# Patient Record
Sex: Female | Born: 1965
Health system: Southern US, Community
[De-identification: ages and names within clinical notes are randomized; demographics above are authoritative.]

## PROBLEM LIST (undated history)

## (undated) DIAGNOSIS — E119 Type 2 diabetes mellitus without complications: Secondary | ICD-10-CM

## (undated) DIAGNOSIS — I10 Essential (primary) hypertension: Secondary | ICD-10-CM

## (undated) DIAGNOSIS — E559 Vitamin D deficiency, unspecified: Secondary | ICD-10-CM

## (undated) HISTORY — PX: NEPHRECTOMY: SHX65

---
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
## 1998-06-23 ENCOUNTER — Emergency Department (HOSPITAL_COMMUNITY): Admission: EM | Admit: 1998-06-23 | Discharge: 1998-06-23 | Payer: Self-pay | Admitting: Internal Medicine

## 1999-04-09 ENCOUNTER — Encounter: Admission: RE | Admit: 1999-04-09 | Discharge: 1999-04-09 | Payer: Self-pay | Admitting: Family Medicine

## 1999-04-15 ENCOUNTER — Encounter: Admission: RE | Admit: 1999-04-15 | Discharge: 1999-04-15 | Payer: Self-pay | Admitting: Family Medicine

## 1999-11-11 ENCOUNTER — Encounter: Admission: RE | Admit: 1999-11-11 | Discharge: 1999-11-11 | Payer: Self-pay | Admitting: Family Medicine

## 2000-02-28 ENCOUNTER — Emergency Department (HOSPITAL_COMMUNITY): Admission: EM | Admit: 2000-02-28 | Discharge: 2000-02-28 | Payer: Self-pay | Admitting: Emergency Medicine

## 2001-01-11 ENCOUNTER — Encounter: Admission: RE | Admit: 2001-01-11 | Discharge: 2001-04-11 | Payer: Self-pay | Admitting: Specialist

## 2001-01-12 ENCOUNTER — Encounter: Admission: RE | Admit: 2001-01-12 | Discharge: 2001-01-12 | Payer: Self-pay | Admitting: Family Medicine

## 2001-03-02 ENCOUNTER — Encounter: Admission: RE | Admit: 2001-03-02 | Discharge: 2001-03-02 | Payer: Self-pay | Admitting: Family Medicine

## 2001-04-05 ENCOUNTER — Encounter: Admission: RE | Admit: 2001-04-05 | Discharge: 2001-04-05 | Payer: Self-pay | Admitting: Family Medicine

## 2001-06-30 ENCOUNTER — Emergency Department (HOSPITAL_COMMUNITY): Admission: EM | Admit: 2001-06-30 | Discharge: 2001-06-30 | Payer: Self-pay | Admitting: Emergency Medicine

## 2002-09-25 ENCOUNTER — Emergency Department (HOSPITAL_COMMUNITY): Admission: EM | Admit: 2002-09-25 | Discharge: 2002-09-25 | Payer: Self-pay | Admitting: Emergency Medicine

## 2002-09-25 ENCOUNTER — Encounter: Payer: Self-pay | Admitting: Emergency Medicine

## 2002-11-19 ENCOUNTER — Emergency Department (HOSPITAL_COMMUNITY): Admission: EM | Admit: 2002-11-19 | Discharge: 2002-11-19 | Payer: Self-pay | Admitting: Emergency Medicine

## 2002-11-19 ENCOUNTER — Encounter: Payer: Self-pay | Admitting: Emergency Medicine

## 2003-05-11 ENCOUNTER — Emergency Department (HOSPITAL_COMMUNITY): Admission: EM | Admit: 2003-05-11 | Discharge: 2003-05-11 | Payer: Self-pay | Admitting: Emergency Medicine

## 2004-08-06 ENCOUNTER — Ambulatory Visit: Payer: Self-pay | Admitting: Family Medicine

## 2004-08-18 ENCOUNTER — Encounter: Admission: RE | Admit: 2004-08-18 | Discharge: 2004-08-18 | Payer: Self-pay | Admitting: Sports Medicine

## 2004-08-26 ENCOUNTER — Ambulatory Visit: Payer: Self-pay | Admitting: Family Medicine

## 2004-09-09 ENCOUNTER — Ambulatory Visit (HOSPITAL_COMMUNITY): Admission: RE | Admit: 2004-09-09 | Discharge: 2004-09-09 | Payer: Self-pay | Admitting: Sports Medicine

## 2005-03-25 ENCOUNTER — Ambulatory Visit: Payer: Self-pay | Admitting: Family Medicine

## 2005-04-23 ENCOUNTER — Ambulatory Visit: Payer: Self-pay | Admitting: Family Medicine

## 2005-09-02 ENCOUNTER — Ambulatory Visit: Payer: Self-pay | Admitting: Family Medicine

## 2005-09-02 ENCOUNTER — Ambulatory Visit (HOSPITAL_COMMUNITY): Admission: RE | Admit: 2005-09-02 | Discharge: 2005-09-02 | Payer: Self-pay | Admitting: Family Medicine

## 2005-09-03 ENCOUNTER — Ambulatory Visit: Payer: Self-pay | Admitting: Family Medicine

## 2005-09-16 ENCOUNTER — Encounter: Admission: RE | Admit: 2005-09-16 | Discharge: 2005-09-16 | Payer: Self-pay | Admitting: Family Medicine

## 2007-01-09 ENCOUNTER — Ambulatory Visit: Payer: Self-pay | Admitting: Family Medicine

## 2007-01-19 DIAGNOSIS — D509 Iron deficiency anemia, unspecified: Secondary | ICD-10-CM

## 2013-11-22 DIAGNOSIS — Z780 Asymptomatic menopausal state: Secondary | ICD-10-CM

## 2013-11-22 HISTORY — DX: Asymptomatic menopausal state: Z78.0

## 2017-03-04 ENCOUNTER — Encounter (HOSPITAL_BASED_OUTPATIENT_CLINIC_OR_DEPARTMENT_OTHER): Payer: Self-pay | Admitting: *Deleted

## 2017-03-04 ENCOUNTER — Emergency Department (HOSPITAL_BASED_OUTPATIENT_CLINIC_OR_DEPARTMENT_OTHER)
Admission: EM | Admit: 2017-03-04 | Discharge: 2017-03-04 | Disposition: A | Discharge status: Home / Self Care | Payer: Self-pay | Attending: Emergency Medicine | Admitting: Emergency Medicine

## 2017-03-04 ENCOUNTER — Ambulatory Visit (INDEPENDENT_AMBULATORY_CARE_PROVIDER_SITE_OTHER): Payer: Self-pay | Admitting: Family Medicine

## 2017-03-04 ENCOUNTER — Encounter: Payer: Self-pay | Admitting: Family Medicine

## 2017-03-04 ENCOUNTER — Other Ambulatory Visit (HOSPITAL_COMMUNITY)
Admission: RE | Admit: 2017-03-04 | Discharge: 2017-03-04 | Disposition: A | Discharge status: Home / Self Care | Payer: Self-pay | Source: Ambulatory Visit | Attending: Family Medicine | Admitting: Family Medicine

## 2017-03-04 VITALS — BP 172/90 | HR 78 | Temp 98.6°F | Resp 16 | Ht 68.0 in | Wt 200.0 lb

## 2017-03-04 DIAGNOSIS — E119 Type 2 diabetes mellitus without complications: Secondary | ICD-10-CM | POA: Insufficient documentation

## 2017-03-04 DIAGNOSIS — Z131 Encounter for screening for diabetes mellitus: Secondary | ICD-10-CM

## 2017-03-04 DIAGNOSIS — Z794 Long term (current) use of insulin: Secondary | ICD-10-CM

## 2017-03-04 DIAGNOSIS — N760 Acute vaginitis: Secondary | ICD-10-CM

## 2017-03-04 DIAGNOSIS — E785 Hyperlipidemia, unspecified: Secondary | ICD-10-CM

## 2017-03-04 DIAGNOSIS — Z79899 Other long term (current) drug therapy: Secondary | ICD-10-CM | POA: Insufficient documentation

## 2017-03-04 DIAGNOSIS — E1165 Type 2 diabetes mellitus with hyperglycemia: Secondary | ICD-10-CM | POA: Insufficient documentation

## 2017-03-04 DIAGNOSIS — I10 Essential (primary) hypertension: Secondary | ICD-10-CM

## 2017-03-04 DIAGNOSIS — R739 Hyperglycemia, unspecified: Secondary | ICD-10-CM

## 2017-03-04 HISTORY — DX: Essential (primary) hypertension: I10

## 2017-03-04 HISTORY — DX: Type 2 diabetes mellitus without complications: E11.9

## 2017-03-04 LAB — POCT URINALYSIS DIP (DEVICE)
Bilirubin Urine: NEGATIVE
Glucose, UA: 500 mg/dL — AB
Ketones, ur: NEGATIVE mg/dL
Nitrite: NEGATIVE
Protein, ur: NEGATIVE mg/dL
Specific Gravity, Urine: 1.01 (ref 1.005–1.030)
Urobilinogen, UA: 0.2 mg/dL (ref 0.0–1.0)
pH: 7 (ref 5.0–8.0)

## 2017-03-04 LAB — LIPID PANEL
Cholesterol: 213 mg/dL — ABNORMAL HIGH (ref <200)
HDL: 46 mg/dL — ABNORMAL LOW (ref >50)
LDL CALC: 118 mg/dL — AB (ref <100)
TRIGLYCERIDES: 245 mg/dL — AB (ref <150)
Total CHOL/HDL Ratio: 4.6 Ratio (ref <5.0)
VLDL: 49 mg/dL — AB (ref <30)

## 2017-03-04 LAB — CBC WITH DIFFERENTIAL/PLATELET
BASOS ABS: 49 {cells}/uL (ref 0–200)
Basophils Relative: 1 %
EOS ABS: 147 {cells}/uL (ref 15–500)
Eosinophils Relative: 3 %
HCT: 42.6 % (ref 35.0–45.0)
Hemoglobin: 14.1 g/dL (ref 11.7–15.5)
LYMPHS PCT: 39 %
Lymphs Abs: 1911 cells/uL (ref 850–3900)
MCH: 30.2 pg (ref 27.0–33.0)
MCHC: 33.1 g/dL (ref 32.0–36.0)
MCV: 91.2 fL (ref 80.0–100.0)
MONOS PCT: 5 %
Monocytes Absolute: 245 cells/uL (ref 200–950)
NEUTROS PCT: 52 %
Neutro Abs: 2548 cells/uL (ref 1500–7800)
PLATELETS: 171 10*3/uL (ref 140–400)
RBC: 4.67 MIL/uL (ref 3.80–5.10)
RDW: 12.7 % (ref 11.0–15.0)
WBC: 4.9 10*3/uL (ref 3.8–10.8)

## 2017-03-04 LAB — CBG MONITORING, ED: Glucose-Capillary: 317 mg/dL — ABNORMAL HIGH (ref 65–99)

## 2017-03-04 LAB — GLUCOSE, CAPILLARY
Glucose-Capillary: 520 mg/dL (ref 65–99)
Glucose-Capillary: 467 mg/dL — ABNORMAL HIGH (ref 65–99)

## 2017-03-04 LAB — THYROID PANEL WITH TSH
Free Thyroxine Index: 2.4 (ref 1.4–3.8)
T3 Uptake: 32 % (ref 22–35)
T4, Total: 7.4 ug/dL (ref 4.5–12.0)
TSH: 2.36 mIU/L

## 2017-03-04 LAB — COMPLETE METABOLIC PANEL WITH GFR
ALT: 70 U/L — AB (ref 6–29)
AST: 53 U/L — AB (ref 10–35)
Albumin: 4.3 g/dL (ref 3.6–5.1)
Alkaline Phosphatase: 143 U/L — ABNORMAL HIGH (ref 33–130)
BUN: 9 mg/dL (ref 7–25)
CO2: 30 mmol/L (ref 20–31)
CREATININE: 1.03 mg/dL (ref 0.50–1.05)
Calcium: 9.4 mg/dL (ref 8.6–10.4)
Chloride: 97 mmol/L — ABNORMAL LOW (ref 98–110)
GFR, EST NON AFRICAN AMERICAN: 63 mL/min (ref >=60)
GFR, Est African American: 72 mL/min (ref >=60)
GLUCOSE: 527 mg/dL — AB (ref 65–99)
Potassium: 4.5 mmol/L (ref 3.5–5.3)
SODIUM: 135 mmol/L (ref 135–146)
Total Bilirubin: 0.8 mg/dL (ref 0.2–1.2)
Total Protein: 7.9 g/dL (ref 6.1–8.1)

## 2017-03-04 LAB — POCT GLYCOSYLATED HEMOGLOBIN (HGB A1C): Hemoglobin A1C: 13.6

## 2017-03-04 MED ORDER — INJECTION DEVICE FOR INSULIN DEVI
Freq: Once | Status: AC
  Administered 2017-03-04: 16:00:00

## 2017-03-04 MED ORDER — AMLODIPINE BESYLATE 5 MG PO TABS: 5.0000 mg | ORAL_TABLET | Freq: Every day | ORAL | 0 refills | Status: DC

## 2017-03-04 MED ORDER — METFORMIN HCL 500 MG PO TABS: 500.0000 mg | ORAL_TABLET | Freq: Two times a day (BID) | ORAL | 0 refills | Status: DC

## 2017-03-04 MED ORDER — AMLODIPINE BESYLATE 5 MG PO TABS: 5.0000 mg | ORAL_TABLET | Freq: Every day | ORAL | 3 refills | Status: DC

## 2017-03-04 MED ORDER — METFORMIN HCL 500 MG PO TABS: 500.0000 mg | ORAL_TABLET | Freq: Two times a day (BID) | ORAL | 3 refills | Status: DC

## 2017-03-04 MED ORDER — FLUCONAZOLE 150 MG PO TABS: 150.0000 mg | ORAL_TABLET | Freq: Once | ORAL | 0 refills | Status: AC

## 2017-03-04 MED ORDER — INSULIN LISPRO 100 UNIT/ML ~~LOC~~ SOLN: 5.0000 [IU] | Freq: Three times a day (TID) | SUBCUTANEOUS | 11 refills | Status: DC

## 2017-03-04 MED ORDER — INJECTION DEVICE FOR INSULIN DEVI: Freq: Once | Status: DC

## 2017-03-04 MED ORDER — INSULIN GLARGINE 100 UNIT/ML SOLOSTAR PEN: 10.0000 [IU] | PEN_INJECTOR | Freq: Every day | SUBCUTANEOUS | 99 refills | Status: DC

## 2017-03-04 MED ORDER — BLOOD GLUCOSE MONITOR KIT: PACK | 0 refills | Status: DC

## 2017-03-04 MED FILL — !TRUE METRIX BLOOD GLUCOSE: 365 days supply | Qty: 1 | Fill #0

## 2017-03-04 MED FILL — TRUEplus LANCETS 28G MISC: 25 days supply | Qty: 100 | Fill #0

## 2017-03-04 MED FILL — ?METFORMIN HCL 500MG TABLET: 500 | 30 days supply | Qty: 60 | Fill #0

## 2017-03-04 MED FILL — TRUE METRIX TEST STRIP: 25 days supply | Qty: 100 | Fill #0

## 2017-03-04 MED FILL — FLUCONAZOLE 150 MG TABLET: 150 | 7 days supply | Qty: 7 | Fill #0

## 2017-03-04 MED FILL — ?AMLODIPINE BESYLATE 5 MG T: 5 | 30 days supply | Qty: 30 | Fill #0

## 2017-03-04 MED FILL — !LANTUS SOLOSTAR 100UNITS/M: 100 | 28 days supply | Qty: 3 | Fill #0

## 2017-03-04 NOTE — ED Triage Notes (Signed)
Pt states Dx DM today given scription, and pharmacy closed before she got them.

## 2017-03-04 NOTE — Patient Instructions (Addendum)
Administer 10 units of Lantus nightly   Start Metformin 500 mg twice daily with meals.  Administer 5 units of Humalog 3 times daily before meals. Do not administer if blood sugar is less than 125!  For blood pressure, start Amlodipine 5 mg once daily.    Diabetes Mellitus and Food It is important for you to manage your blood sugar (glucose) level. Your blood glucose level can be greatly affected by what you eat. Eating healthier foods in the appropriate amounts throughout the day at about the same time each day will help you control your blood glucose level. It can also help slow or prevent worsening of your diabetes mellitus. Healthy eating may even help you improve the level of your blood pressure and reach or maintain a healthy weight. General recommendations for healthful eating and cooking habits include:  Eating meals and snacks regularly. Avoid going long periods of time without eating to lose weight.  Eating a diet that consists mainly of plant-based foods, such as fruits, vegetables, nuts, legumes, and whole grains.  Using low-heat cooking methods, such as baking, instead of high-heat cooking methods, such as deep frying. Work with your dietitian to make sure you understand how to use the Nutrition Facts information on food labels. How can food affect me? Carbohydrates  Carbohydrates affect your blood glucose level more than any other type of food. Your dietitian will help you determine how many carbohydrates to eat at each meal and teach you how to count carbohydrates. Counting carbohydrates is important to keep your blood glucose at a healthy level, especially if you are using insulin or taking certain medicines for diabetes mellitus. Alcohol  Alcohol can cause sudden decreases in blood glucose (hypoglycemia), especially if you use insulin or take certain medicines for diabetes mellitus. Hypoglycemia can be a life-threatening condition. Symptoms of hypoglycemia (sleepiness,  dizziness, and disorientation) are similar to symptoms of having too much alcohol. If your health care provider has given you approval to drink alcohol, do so in moderation and use the following guidelines:  Women should not have more than one drink per day, and men should not have more than two drinks per day. One drink is equal to:  12 oz of beer.  5 oz of wine.  1 oz of hard liquor.  Do not drink on an empty stomach.  Keep yourself hydrated. Have water, diet soda, or unsweetened iced tea.  Regular soda, juice, and other mixers might contain a lot of carbohydrates and should be counted. What foods are not recommended? As you make food choices, it is important to remember that all foods are not the same. Some foods have fewer nutrients per serving than other foods, even though they might have the same number of calories or carbohydrates. It is difficult to get your body what it needs when you eat foods with fewer nutrients. Examples of foods that you should avoid that are high in calories and carbohydrates but low in nutrients include:  Trans fats (most processed foods list trans fats on the Nutrition Facts label).  Regular soda.  Juice.  Candy.  Sweets, such as cake, pie, doughnuts, and cookies.  Fried foods. What foods can I eat? Eat nutrient-rich foods, which will nourish your body and keep you healthy. The food you should eat also will depend on several factors, including:  The calories you need.  The medicines you take.  Your weight.  Your blood glucose level.  Your blood pressure level.  Your cholesterol level. You  should eat a variety of foods, including:  Protein.  Lean cuts of meat.  Proteins low in saturated fats, such as fish, egg whites, and beans. Avoid processed meats.  Fruits and vegetables.  Fruits and vegetables that may help control blood glucose levels, such as apples, mangoes, and yams.  Dairy products.  Choose fat-free or low-fat dairy  products, such as milk, yogurt, and cheese.  Grains, bread, pasta, and rice.  Choose whole grain products, such as multigrain bread, whole oats, and brown rice. These foods may help control blood pressure.  Fats.  Foods containing healthful fats, such as nuts, avocado, olive oil, canola oil, and fish. Does everyone with diabetes mellitus have the same meal plan? Because every person with diabetes mellitus is different, there is not one meal plan that works for everyone. It is very important that you meet with a dietitian who will help you create a meal plan that is just right for you. This information is not intended to replace advice given to you by your health care provider. Make sure you discuss any questions you have with your health care provider. Document Released: 08/05/2005 Document Revised: 04/15/2016 Document Reviewed: 10/05/2013 Elsevier Interactive Patient Education  2017 [employer].  Blood Glucose Monitoring, Adult Monitoring your blood sugar (glucose) helps you manage your diabetes. It also helps you and your health care provider determine how well your diabetes management plan is working. Blood glucose monitoring involves checking your blood glucose as often as directed, and keeping a record (log) of your results over time. Why should I monitor my blood glucose? Checking your blood glucose regularly can:  Help you understand how food, exercise, illnesses, and medicines affect your blood glucose.  Let you know what your blood glucose is at any time. You can quickly tell if you are having low blood glucose (hypoglycemia) or high blood glucose (hyperglycemia).  Help you and your health care provider adjust your medicines as needed. When should I check my blood glucose? Follow instructions from your health care provider about how often to check your blood glucose. This may depend on:  The type of diabetes you have.  How well-controlled your diabetes is.  Medicines you  are taking. If you have type 1 diabetes:   Check your blood glucose at least 2 times a day.  Also check your blood glucose:  Before every insulin injection.  Before and after exercise.  Between meals.  2 hours after a meal.  Occasionally between 2:00 a.m. and 3:00 a.m., as directed.  Before potentially dangerous tasks, like driving or using heavy machinery.  At bedtime.  You may need to check your blood glucose more often, up to 6-10 times a day:  If you use an insulin pump.  If you need multiple daily injections (MDI).  If your diabetes is not well-controlled.  If you are ill.  If you have a history of severe hypoglycemia.  If you have a history of not knowing when your blood glucose is getting low (hypoglycemia unawareness). If you have type 2 diabetes:   If you take insulin or other diabetes medicines, check your blood glucose at least 2 times a day.  If you are on intensive insulin therapy, check your blood glucose at least 4 times a day. Occasionally, you may also need to check between 2:00 a.m. and 3:00 a.m., as directed.  Also check your blood glucose:  Before and after exercise.  Before potentially dangerous tasks, like driving or using heavy machinery.  You  may need to check your blood glucose more often if:  Your medicine is being adjusted.  Your diabetes is not well-controlled.  You are ill. What is a blood glucose log?  A blood glucose log is a record of your blood glucose readings. It helps you and your health care provider:  Look for patterns in your blood glucose over time.  Adjust your diabetes management plan as needed.  Every time you check your blood glucose, write down your result and notes about things that may be affecting your blood glucose, such as your diet and exercise for the day.  Most glucose meters store a record of glucose readings in the meter. Some meters allow you to download your records to a computer. How do I check  my blood glucose? Follow these steps to get accurate readings of your blood glucose: Supplies needed    Blood glucose meter.  Test strips for your meter. Each meter has its own strips. You must use the strips that come with your meter.  A needle to prick your finger (lancet). Do not use lancets more than once.  A device that holds the lancet (lancing device).  A journal or log book to write down your results. Procedure   Wash your hands with soap and water.  Prick the side of your finger (not the tip) with the lancet. Use a different finger each time.  Gently rub the finger until a small drop of blood appears.  Follow instructions that come with your meter for inserting the test strip, applying blood to the strip, and using your blood glucose meter.  Write down your result and any notes. Alternative testing sites   Some meters allow you to use areas of your body other than your finger (alternative sites) to test your blood.  If you think you may have hypoglycemia, or if you have hypoglycemia unawareness, do not use alternative sites. Use your finger instead.  Alternative sites may not be as accurate as the fingers, because blood flow is slower in these areas. This means that the result you get may be delayed, and it may be different from the result that you would get from your finger.  The most common alternative sites are:  Forearm.  Thigh.  Palm of the hand. Additional tips   Always keep your supplies with you.  If you have questions or need help, all blood glucose meters have a 24-hour "hotline" number that you can call. You may also contact your health care provider.  After you use a few boxes of test strips, adjust (calibrate) your blood glucose meter by following instructions that came with your meter. This information is not intended to replace advice given to you by your health care provider. Make sure you discuss any questions you have with your health care  provider. Document Released: 11/11/2003 Document Revised: 05/28/2016 Document Reviewed: 04/19/2016 Elsevier Interactive Patient Education  2017 [place].  Hypoglycemia  Hypoglycemia is when the sugar (glucose) level in the blood is too low. Symptoms of low blood sugar may include:  Feeling:  Hungry.  Worried or nervous (anxious).  Sweaty and clammy.  Confused.  Dizzy.  Sleepy.  Sick to your stomach (nauseous).  Having:  A fast heartbeat.  A headache.  A change in your vision.  Jerky movements that you cannot control (seizure).  Nightmares.  Tingling or no feeling (numbness) around the mouth, lips, or tongue.  Having trouble with:  Talking.  Paying attention (concentrating).  Moving (coordination).  Sleeping.  Shaking.  Passing out (fainting).  Getting upset easily (irritability). Low blood sugar can happen to people who have diabetes and people who do not have diabetes. Low blood sugar can happen quickly, and it can be an emergency. Treating Low Blood Sugar  Low blood sugar is often treated by eating or drinking something sugary right away. If you can think clearly and swallow safely, follow the 15:15 rule:  Take 15 grams of a fast-acting carb (carbohydrate). Some fast-acting carbs are:  1 tube of glucose gel.  3 sugar tablets (glucose pills).  6-8 pieces of hard candy.  4 oz (120 mL) of fruit juice.  4 oz (120 mL) of regular (not diet) soda.  Check your blood sugar 15 minutes after you take the carb.  If your blood sugar is still at or below 70 mg/dL (3.9 mmol/L), take 15 grams of a carb again.  If your blood sugar does not go above 70 mg/dL (3.9 mmol/L) after 3 tries, get help right away.  After your blood sugar goes back to normal, eat a meal or a snack within 1 hour. Treating Very Low Blood Sugar  If your blood sugar is at or below 54 mg/dL (3 mmol/L), you have very low blood sugar (severe hypoglycemia). This is an emergency. Do not  wait to see if the symptoms will go away. Get medical help right away. Call your local emergency services (911 in the [place] Do not drive yourself to the hospital. If you have very low blood sugar and you cannot eat or drink, you may need a glucagon shot (injection). A family member or friend should learn how to check your blood sugar and how to give you a glucagon shot. Ask your doctor if you need to have a glucagon shot kit at home. Follow these instructions at home: General instructions   Avoid any diets that cause you to not eat enough food. Talk with your doctor before you start any new diet.  Take over-the-counter and prescription medicines only as told by your doctor.  Limit alcohol to no more than 1 drink per day for nonpregnant women and 2 drinks per day for men. One drink equals 12 oz of beer, 5 oz of wine, or 1 oz of hard liquor.  Keep all follow-up visits as told by your doctor. This is important. If You Have Diabetes:    Make sure you know the symptoms of low blood sugar.  Always keep a source of sugar with you, such as:  Sugar.  Sugar tablets.  Glucose gel.  Fruit juice.  Regular soda (not diet soda).  Milk.  Hard candy.  Honey.  Take your medicines as told.  Follow your exercise and meal plan.  Eat on time. Do not skip meals.  Follow your sick day plan when you cannot eat or drink normally. Make this plan ahead of time with your doctor.  Check your blood sugar as often as told by your doctor. Always check before and after exercise.  Share your diabetes care plan with:  Your work or school.  People you live with.  Check your pee (urine) for ketones:  When you are sick.  As told by your doctor.  Carry a card or wear jewelry that says you have diabetes. If You Have Low Blood Sugar From Other Causes:    Check your blood sugar as often as told by your doctor.  Follow instructions from your doctor about what you cannot eat or drink. Contact  a  doctor if:  You have trouble keeping your blood sugar in your target range.  You have low blood sugar often. Get help right away if:  You still have symptoms after you eat or drink something sugary.  Your blood sugar is at or below 54 mg/dL (3 mmol/L).  You have jerky movements that you cannot control.  You pass out. These symptoms may be an emergency. Do not wait to see if the symptoms will go away. Get medical help right away. Call your local emergency services (911 in the [place] Do not drive yourself to the hospital. This information is not intended to replace advice given to you by your health care provider. Make sure you discuss any questions you have with your health care provider. Document Released: 02/02/2010 Document Revised: 04/15/2016 Document Reviewed: 12/12/2015 Elsevier Interactive Patient Education  2017 [employer] Inc[employer]    Hypertension Hypertension is another name for high blood pressure. High blood pressure forces your heart to work harder to pump blood. This can cause problems over time. There are two numbers in a blood pressure reading. There is a top number (systolic) over a bottom number (diastolic). It is best to have a blood pressure below 120/80. Healthy choices can help lower your blood pressure. You may need medicine to help lower your blood pressure if:  Your blood pressure cannot be lowered with healthy choices.  Your blood pressure is higher than 130/80. Follow these instructions at home: Eating and drinking   If directed, follow the DASH eating plan. This diet includes:  Filling half of your plate at each meal with fruits and vegetables.  Filling one quarter of your plate at each meal with whole grains. Whole grains include whole wheat pasta, brown rice, and whole grain bread.  Eating or drinking low-fat dairy products, such as skim milk or low-fat yogurt.  Filling one quarter of your plate at each meal with low-fat (lean) proteins. Low-fat proteins  include fish, skinless chicken, eggs, beans, and tofu.  Avoiding fatty meat, cured and processed meat, or chicken with skin.  Avoiding premade or processed food.  Eat less than 1,500 mg of salt (sodium) a day.  Limit alcohol use to no more than 1 drink a day for nonpregnant women and 2 drinks a day for men. One drink equals 12 oz of beer, 5 oz of wine, or 1 oz of hard liquor. Lifestyle   Work with your doctor to stay at a healthy weight or to lose weight. Ask your doctor what the best weight is for you.  Get at least 30 minutes of exercise that causes your heart to beat faster (aerobic exercise) most days of the week. This may include walking, swimming, or biking.  Get at least 30 minutes of exercise that strengthens your muscles (resistance exercise) at least 3 days a week. This may include lifting weights or pilates.  Do not use any products that contain nicotine or tobacco. This includes cigarettes and e-cigarettes. If you need help quitting, ask your doctor.  Check your blood pressure at home as told by your doctor.  Keep all follow-up visits as told by your doctor. This is important. Medicines   Take over-the-counter and prescription medicines only as told by your doctor. Follow directions carefully.  Do not skip doses of blood pressure medicine. The medicine does not work as well if you skip doses. Skipping doses also puts you at risk for problems.  Ask your doctor about side effects or reactions to medicines  that you should watch for. Contact a doctor if:  You think you are having a reaction to the medicine you are taking.  You have headaches that keep coming back (recurring).  You feel dizzy.  You have swelling in your ankles.  You have trouble with your vision. Get help right away if:  You get a very bad headache.  You start to feel confused.  You feel weak or numb.  You feel faint.  You get very bad pain in your:  Chest.  Belly (abdomen).  You throw  up (vomit) more than once.  You have trouble breathing. Summary  Hypertension is another name for high blood pressure.  Making healthy choices can help lower blood pressure. If your blood pressure cannot be controlled with healthy choices, you may need to take medicine. This information is not intended to replace advice given to you by your health care provider. Make sure you discuss any questions you have with your health care provider. Document Released: 04/26/2008 Document Revised: 10/06/2016 Document Reviewed: 10/06/2016 Elsevier Interactive Patient Education  2017 [employer].

## 2017-03-04 NOTE — ED Provider Notes (Signed)
[facility] Note   CSN: [id number] Arrival date & time: 03/04/17  1827  By signing my name below, I, Kimberly Herman, attest that this documentation has been prepared under the direction and in the presence of Kimberly Bucco, MD . Electronically Signed: Nelwyn Herman, Scribe. 03/04/2017. 7:15 PM.  History   Chief Complaint Chief Complaint  Patient presents with  . Hyperglycemia   The history is provided by the patient. No language interpreter was used.    HPI Comments:  Kimberly Herman is a 51 y.o. female with pmhx of DM and HTN who presents to the Emergency Department complaining of resolved headache onset today. PT was diagnosed with DM today when she was [facility] for establishment of care.  She has been unable to get those prescriptions due to the fact that the Pharmacy is closed and her blood sugar was particularly high today. No modifying factors indicated. Denies any other symptoms.   Past Medical History:  Diagnosis Date  . Diabetes mellitus without complication (HCC)   . Hypertension     Patient Active Problem List   Diagnosis Date Noted  . ANEMIA, IRON DEFICIENCY, UNSPEC. 01/19/2007    History reviewed. No pertinent surgical history.  OB History    No data available       Home Medications    Prior to Admission medications   Medication Sig Start Date End Date Taking? Authorizing Provider  amLODipine (NORVASC) 5 MG tablet Take 1 tablet (5 mg total) by mouth daily. 03/04/17   Kimberly Bucco, MD  blood glucose meter kit and supplies KIT Dispense based on patient and insurance preference. Use up to four times daily as directed. (FOR ICD-9 250.00, 250.01). 03/04/17   Doyle Askew, FNP  fluconazole (DIFLUCAN) 150 MG tablet Take 1 tablet (150 mg total) by mouth once. 03/04/17 03/04/17  Doyle Askew, FNP  Insulin Glargine (LANTUS SOLOSTAR) 100 UNIT/ML Solostar Pen Inject 10 Units into the skin daily at 10 pm.  03/04/17   Doyle Askew, FNP  insulin lispro (HUMALOG) 100 UNIT/ML injection Inject 0.05 mLs (5 Units total) into the skin 3 (three) times daily before meals. 03/04/17   Doyle Askew, FNP  metFORMIN (GLUCOPHAGE) 500 MG tablet Take 1 tablet (500 mg total) by mouth 2 (two) times daily with a meal. 03/04/17   Kimberly Bucco, MD    Family History History reviewed. No pertinent family history.  Social History Social History  Substance Use Topics  . Smoking status: Never Smoker  . Smokeless tobacco: Never Used  . Alcohol use Yes     Allergies   Patient has no known allergies.   Review of Systems Review of Systems  Constitutional: Negative for chills, diaphoresis, fatigue and fever.  HENT: Negative for congestion, rhinorrhea and sneezing.   Eyes: Negative.   Respiratory: Negative for cough, chest tightness and shortness of breath.   Cardiovascular: Negative for chest pain and leg swelling.  Gastrointestinal: Negative for abdominal pain, blood in stool, diarrhea, nausea and vomiting.  Genitourinary: Negative for difficulty urinating, flank pain, frequency and hematuria.  Musculoskeletal: Negative for arthralgias and back pain.  Skin: Negative for rash.  Neurological: Negative for dizziness, speech difficulty, weakness, numbness and headaches.     Physical Exam Updated Vital Signs BP (!) 199/115 (BP Location: Left Arm)   Pulse 72   Temp 98.6 F (37 C) (Oral)   Resp 18   SpO2 100%   Physical Exam  Constitutional: She  is oriented to person, place, and time. She appears well-developed and well-nourished.  HENT:  Head: Normocephalic and atraumatic.  Eyes: Pupils are equal, round, and reactive to light.  Neck: Normal range of motion. Neck supple.  Cardiovascular: Normal rate, regular rhythm and normal heart sounds.   Pulmonary/Chest: Effort normal and breath sounds normal. No respiratory distress. She has no wheezes. She has no rales. She exhibits no  tenderness.  Abdominal: Soft. Bowel sounds are normal. There is no tenderness. There is no rebound and no guarding.  Musculoskeletal: Normal range of motion. She exhibits no edema.  Lymphadenopathy:    She has no cervical adenopathy.  Neurological: She is alert and oriented to person, place, and time.  Skin: Skin is warm and dry. No rash noted.  Psychiatric: She has a normal mood and affect.     ED Treatments / Results  DIAGNOSTIC STUDIES:  Oxygen Saturation is 100% on RA, normal by my interpretation.    COORDINATION OF CARE:  7:21 PM Discussed treatment plan with pt at bedside which includes starting her on some medications while here in the ED and pt agreed to plan.  Labs (all labs ordered are listed, but only abnormal results are displayed) Labs Reviewed  CBG MONITORING, ED - Abnormal; Notable for the following:       Result Value   Glucose-Capillary 317 (*)    All other components within normal limits    EKG  EKG Interpretation None       Radiology No results found.  Procedures Procedures (including critical care time)  Medications Ordered in ED Medications - No data to display   Initial Impression / Assessment and Plan / ED Course  I have reviewed the triage vital signs and the nursing notes.  Pertinent labs & imaging results that were available during my care of the patient were reviewed by me and considered in my medical decision making (see chart for details).     Patient presents with newly diagnosed diabetes. She got prescriptions for her blood pressure as well as diabetes today but she wasn't able to pick them up at [facility] because it was close. She comes in here to get medications for the weekend. She currently is asymptomatic. Her blood pressure is elevated. She was given prescriptions to last the weekend for metformin and Norvasc. She has a follow-up appointment on Monday, 3 days from now, and to recheck her  diabetes.  Final Clinical Impressions(s) / ED Diagnoses   Final diagnoses:  Hyperglycemia    New Prescriptions Current Discharge Medication List    I personally performed the services described in this documentation, which was scribed in my presence.  The recorded information has been reviewed and considered.     Malvin Johns, MD 03/04/17 [phone]

## 2017-03-04 NOTE — Progress Notes (Signed)
Patient ID: Kimberly Herman, female    DOB: [birth year], 51 y.o.   MRN: [id number]  PCP: Molli Barrows, FNP  Chief Complaint  Patient presents with  . Dehydration    always thirsty/ labs wants sugar check  . Nausea  . skin dry  . Blurred Vision  . Vaginitis    Subjective:  HPI Kimberly Herman is a 51 y.o. female presents to establish care and evaluation of chronic dehydration, nausea, blurred vision and vaginitis.  Medical problems include: anemia  Glori reports over the several weeks she has becoming increasingly thirsty, experienced urinary frequency to the pint that "it feels like what I drink runs through me". Blurry vision. Wears reading glasses only. No recent eye exam. She is in college, and reports recently falling asleep in the middle of class in spite of getting a complete night's rest. Blythe reports recurring yeast infections and took a dose of diflucan which only relieved itching for 3 days and vaginal irritation returned. Reports being happily married with spouse as her only sexual partner. Within the last 3 months 3-4 yeast infections that she recalls. She also reports excessive dry skin which is only temporarily relieved with moisturizer.  No prior diagnosis of diabetes or hypertension. Reports delayed healthcare preventative visits as she has been uninsured. Denies family history of diabetes or heart disease.  Social History   Social History  . Marital status: Single    Spouse name: N/A  . Number of children: N/A  . Years of education: N/A   Occupational History  . Not on file.   Social History Main Topics  . Smoking status: Never Smoker  . Smokeless tobacco: Never Used  . Alcohol use Yes  . Drug use: No  . Sexual activity: Not on file   Other Topics Concern  . Not on file   Social History Narrative  . No narrative on file   Review of Systems See HPI   Patient Active Problem List   Diagnosis Date Noted  . ANEMIA, IRON DEFICIENCY, UNSPEC. 01/19/2007     No Known Allergies  Prior to Admission medications   Not on File    Past Medical, Surgical Family and Social History reviewed and updated.    Objective:   Today's Vitals   03/04/17 1445 03/04/17 1607  BP: (!) 180/96 (!) 172/90  Pulse: 78   Resp: 16   Temp: 98.6 F (37 C)   TempSrc: Oral   SpO2: 99%   Weight: 200 lb (90.7 kg)   Height: 5\' 8"  (1.727 m)     Wt Readings from Last 3 Encounters:  03/04/17 200 lb (90.7 kg)    Physical Exam  Constitutional: She is oriented to person, place, and time. She appears well-developed and well-nourished.  HENT:  Head: Normocephalic and atraumatic.  Eyes: Conjunctivae and EOM are normal. Pupils are equal, round, and reactive to light.  Neck: Normal range of motion. Neck supple. No thyromegaly present.  Cardiovascular: Normal rate, regular rhythm, normal heart sounds and intact distal pulses.   No murmur heard. Pulmonary/Chest: Effort normal and breath sounds normal.  Musculoskeletal: Normal range of motion.  Neurological: She is alert and oriented to person, place, and time.  Skin: Skin is warm and dry.  Psychiatric: She has a normal mood and affect. Her behavior is normal. Judgment and thought content normal.       Assessment & Plan:  1.T2DM - POCT glycosylated hemoglobin (Hb A1C) - POCT glucose (manual entry) A1C 13.3 today,  and POCT Glucose 520 Administered 10 units of Humulin insulin, waited 15 minutes, rechecked 467, administered another 10 units of Humulin. Pharmacy closes at 5:00 pm, forego administration of IV fluids to allow time for patient to pick-up medications from [facility]. Patient is to return on 03/07/2017 for Glucose recheck. Prescription given for glucose monitor  - Lantus 10 units nightly at bedtime -Novolog 5 units, 3 times daily with meals  2. Hypertension -Start amlodipine 5 mg once daily -Return on 03/07/2017 for blood pressure recheck   3. Hyperlipidemia -Start  Simvastatin 20 mg daily  4. Acute Vaginitis  -Diflucan 150 mg once and may repeat dose after 3 days if symptoms persists.   A total of 60 minutes, greater than 50% of time spent, managing acute critically elevated blood glucose, instructing on insulin administration, blood glucose monitoring, and hypertension management.  Carroll Sage. [name], MSN, [facility] [address] [place], [place] [address] [phone]

## 2017-03-05 LAB — GLUCOSE, CAPILLARY: Glucose-Capillary: 473 mg/dL — ABNORMAL HIGH (ref 65–99)

## 2017-03-05 LAB — MICROALBUMIN, URINE: MICROALB UR: 1.8 mg/dL

## 2017-03-06 MED ORDER — SIMVASTATIN 20 MG PO TABS: 20.0000 mg | ORAL_TABLET | Freq: Every day | ORAL | 2 refills | Status: DC

## 2017-03-07 ENCOUNTER — Ambulatory Visit (INDEPENDENT_AMBULATORY_CARE_PROVIDER_SITE_OTHER): Payer: Self-pay | Admitting: Family Medicine

## 2017-03-07 ENCOUNTER — Ambulatory Visit (HOSPITAL_COMMUNITY)
Admission: RE | Admit: 2017-03-07 | Discharge: 2017-03-07 | Disposition: A | Discharge status: Home / Self Care | Payer: Self-pay | Source: Ambulatory Visit | Attending: Family Medicine | Admitting: Family Medicine

## 2017-03-07 ENCOUNTER — Encounter: Payer: Self-pay | Admitting: Family Medicine

## 2017-03-07 VITALS — BP 155/72 | HR 80 | Temp 98.6°F | Resp 16 | Ht 68.0 in | Wt 201.0 lb

## 2017-03-07 DIAGNOSIS — R739 Hyperglycemia, unspecified: Secondary | ICD-10-CM

## 2017-03-07 DIAGNOSIS — Z794 Long term (current) use of insulin: Secondary | ICD-10-CM | POA: Insufficient documentation

## 2017-03-07 DIAGNOSIS — I10 Essential (primary) hypertension: Secondary | ICD-10-CM | POA: Insufficient documentation

## 2017-03-07 DIAGNOSIS — N760 Acute vaginitis: Secondary | ICD-10-CM

## 2017-03-07 DIAGNOSIS — E119 Type 2 diabetes mellitus without complications: Secondary | ICD-10-CM | POA: Insufficient documentation

## 2017-03-07 DIAGNOSIS — E785 Hyperlipidemia, unspecified: Secondary | ICD-10-CM

## 2017-03-07 LAB — GLUCOSE, CAPILLARY
GLUCOSE-CAPILLARY: 264 mg/dL — AB (ref 65–99)
Glucose-Capillary: 423 mg/dL — ABNORMAL HIGH (ref 65–99)
Glucose-Capillary: 431 mg/dL — ABNORMAL HIGH (ref 65–99)
Glucose-Capillary: 349 mg/dL — ABNORMAL HIGH (ref 65–99)

## 2017-03-07 LAB — CERVICOVAGINAL ANCILLARY ONLY
BACTERIAL VAGINITIS: POSITIVE — AB
CANDIDA VAGINITIS: POSITIVE — AB
CHLAMYDIA, DNA PROBE: NEGATIVE
NEISSERIA GONORRHEA: NEGATIVE
TRICH (WINDOWPATH): NEGATIVE

## 2017-03-07 MED ORDER — INSULIN ASPART 100 UNIT/ML ~~LOC~~ SOLN: 5.0000 [IU] | Freq: Three times a day (TID) | SUBCUTANEOUS | 11 refills | Status: DC

## 2017-03-07 MED ORDER — INJECTION DEVICE FOR INSULIN DEVI
Freq: Once | Status: AC
  Administered 2017-03-07: 09:00:00

## 2017-03-07 MED ORDER — BLOOD PRESSURE CUFF MISC: 0 refills | Status: DC

## 2017-03-07 MED ORDER — CLONIDINE HCL 0.1 MG PO TABS
0.1000 mg | ORAL_TABLET | Freq: Once | ORAL | Status: AC
  Administered 2017-03-07: 0.1 mg via ORAL

## 2017-03-07 MED ORDER — FLUCONAZOLE 150 MG PO TABS: 150.0000 mg | ORAL_TABLET | Freq: Once | ORAL | 0 refills | Status: AC

## 2017-03-07 MED ORDER — SODIUM CHLORIDE 0.9 % IV SOLN
INTRAVENOUS | Status: DC
  Administered 2017-03-07: 10:00:00 via INTRAVENOUS

## 2017-03-07 MED FILL — SIMVASTATIN 20 MG TABLET: 20 | 90 days supply | Qty: 90 | Fill #0

## 2017-03-07 MED FILL — !NOVOLOG 100UNITS/ML VIAL: 100/ML | 28 days supply | Qty: 10 | Fill #0

## 2017-03-07 NOTE — Progress Notes (Signed)
Patient ID: Kimberly Herman, female    DOB: 07/12/1966, 51 y.o.   MRN: [id number]  PCP: Molli Barrows, FNP  Chief Complaint  Patient presents with  . Follow-up    BLOOD PRESSURE/BLOOD SUGAR    Subjective:  HPI  MIKAILA Herman is a 51 y.o. female presents for follow-up of blood pressure and blood sugar. Patient present x 3 days in office to establish care and was found to be hyperglycemic with a blood glucose in the mid-500. She was treated in office with subcutaneous insulin and prescribed home long acting and short-acting insulin. [facility] and the pharmacy had closed before she was able to obtain prescriptions. She went to the emergency department out of fear of not having her medications and was prescribed a few doses of Metformin and Amlodipine for elevated blood pressure. She reports that she purchased a meter over the weekend and had a glucose reading as high as 600. Uncertain of her blood pressure readings over the weekend. Denies at present chest pain, neuropathy, dysuria, shortness of breath, blurred vision, or dizziness.  Social History   Social History  . Marital status: Single    Spouse name: N/A  . Number of children: N/A  . Years of education: N/A   Occupational History  . Not on file.   Social History Main Topics  . Smoking status: Never Smoker  . Smokeless tobacco: Never Used  . Alcohol use Yes  . Drug use: No  . Sexual activity: Not on file   Other Topics Concern  . Not on file   Social History Narrative  . No narrative on file    Review of Systems  See HPI   Patient Active Problem List   Diagnosis Date Noted  . ANEMIA, IRON DEFICIENCY, UNSPEC. 01/19/2007    No Known Allergies  Prior to Admission medications   Medication Sig Start Date End Date Taking? Authorizing Provider  amLODipine (NORVASC) 5 MG tablet Take 1 tablet (5 mg total) by mouth daily. 03/04/17  Yes Malvin Johns, MD  blood  glucose meter kit and supplies KIT Dispense based on patient and insurance preference. Use up to four times daily as directed. (FOR ICD-9 250.00, 250.01). 03/04/17  Yes Sedalia Muta, FNP  Insulin Glargine (LANTUS SOLOSTAR) 100 UNIT/ML Solostar Pen Inject 10 Units into the skin daily at 10 pm. 03/04/17  Yes Sedalia Muta, FNP  insulin lispro (HUMALOG) 100 UNIT/ML injection Inject 0.05 mLs (5 Units total) into the skin 3 (three) times daily before meals. 03/04/17  Yes Sedalia Muta, FNP  metFORMIN (GLUCOPHAGE) 500 MG tablet Take 1 tablet (500 mg total) by mouth 2 (two) times daily with a meal. 03/04/17  Yes Malvin Johns, MD  simvastatin (ZOCOR) 20 MG tablet Take 1 tablet (20 mg total) by mouth daily. 03/06/17  Yes Sedalia Muta, FNP    Past Medical, Surgical Family and Social History reviewed and updated.    Objective:   Today's Vitals   03/07/17 0818  BP: (!) 172/89  Pulse: 80  Resp: 16  Temp: 98.6 F (37 C)  TempSrc: Oral  SpO2: 99%  Weight: 201 lb (91.2 kg)  Height: '5\' 8"'  (1.727 m)    Wt Readings from Last 3 Encounters:  03/07/17 201 lb (91.2 kg)  03/04/17 200 lb (90.7 kg)    Physical Exam  Constitutional: She is oriented to person, place, and time. She appears well-developed and well-nourished.  HENT:  Head: Normocephalic and atraumatic.  Right Ear: External ear normal.  Left Ear: External ear normal.  Nose: Nose normal.  Mouth/Throat: Oropharynx is clear and moist.  Eyes: Conjunctivae and EOM are normal. Pupils are equal, round, and reactive to light.  Neck: Normal range of motion. Neck supple.  Cardiovascular: Normal rate, regular rhythm, normal heart sounds and intact distal pulses.   Pulmonary/Chest: Effort normal and breath sounds normal.  Musculoskeletal: Normal range of motion.  Neurological: She is alert and oriented to person, place, and time.  Skin: Skin is warm and dry.  Psychiatric: She has a normal mood and  affect. Her behavior is normal. Judgment and thought content normal.     Assessment & Plan:  1. Type 2 diabetes mellitus without complication, without long-term current use of insulin (HCC) - injection device for insulin; by Other route once.-15 units Humalog - injection device for insulin; by Other route once-10 units Humalog  2. Essential hypertension - CloNIDine (CATAPRES) tablet 0.1 mg; Take 1 tablet (0.1 mg total) by mouth once.  3. Hyperlipidemia  -Simvastatin 20 mg daily   Patient was transferred to the day infusion center for IV hydration and hourly monitoring of blood glucose. Pick-up insulin today, and continue regimen prescribed on 03/04/2017.   RTC: 7 days    Carroll Sage. [name], MSN, [facility] [address] [place], [place] [address] [phone]

## 2017-03-07 NOTE — Discharge Instructions (Signed)
Keep follow-up appointment. Continue to monitor blood pressure and glucose at home.    Hyperglycemia Hyperglycemia is when the sugar (glucose) level in your blood is too high. It may not cause symptoms. If you do have symptoms, they may include warning signs, such as:  Feeling more thirsty than normal.  Hunger.  Feeling tired.  Needing to pee (urinate) more than normal.  Blurry eyesight (vision). You may get other symptoms as it gets worse, such as:  Dry mouth.  Not being hungry (loss of appetite).  Fruity-smelling breath.  Weakness.  Weight gain or loss that is not planned. Weight loss may be fast.  A tingling or numb feeling in your hands or feet.  Headache.  Skin that does not bounce back quickly when it is lightly pinched and released (poor skin turgor).  Pain in your belly (abdomen).  Cuts or bruises that heal slowly. High blood sugar can happen to people who do or do not have diabetes. High blood sugar can happen slowly or quickly, and it can be an emergency. Follow these instructions at home: General instructions   Take over-the-counter and prescription medicines only as told by your doctor.  Do not use products that contain nicotine or tobacco, such as cigarettes and e-cigarettes. If you need help quitting, ask your doctor.  Limit alcohol intake to no more than 1 drink per day for nonpregnant women and 2 drinks per day for men. One drink equals 12 oz of beer, 5 oz of wine, or 1 oz of hard liquor.  Manage stress. If you need help with this, ask your doctor.  Keep all follow-up visits as told by your doctor. This is important. Eating and drinking   Stay at a healthy weight.  Exercise regularly, as told by your doctor.  Drink enough fluid, especially when you:  Exercise.  Get sick.  Are in hot temperatures.  Eat healthy foods, such as:  Low-fat (lean) proteins.  Complex carbs (complex carbohydrates), such as whole wheat bread or brown  rice.  Fresh fruits and vegetables.  Low-fat dairy products.  Healthy fats.  Drink enough fluid to keep your pee (urine) clear or pale yellow. If you have diabetes:   Make sure you know the symptoms of hyperglycemia.  Follow your diabetes management plan, as told by your doctor. Make sure you:  Take insulin and medicines as told.  Follow your exercise plan.  Follow your meal plan. Eat on time. Do not skip meals.  Check your blood sugar as often as told. Make sure to check before and after exercise. If you exercise longer or in a different way than you normally do, check your blood sugar more often.  Follow your sick day plan whenever you cannot eat or drink normally. Make this plan ahead of time with your doctor.  Share your diabetes management plan with people in your workplace, school, and household.  Check your urine for ketones when you are ill and as told by your doctor.  Carry a card or wear jewelry that says that you have diabetes. Contact a doctor if:  Your blood sugar level is higher than 240 mg/dL (13.3 mmol/L) for 2 days in a row.  You have problems keeping your blood sugar in your target range.  High blood sugar happens often for you. Get help right away if:  You have trouble breathing.  You have a change in how you think, feel, or act (mental status).  You feel sick to your stomach (nauseous), and  that feeling does not go away.  You cannot stop throwing up (vomiting). These symptoms may be an emergency. Do not wait to see if the symptoms will go away. Get medical help right away. Call your local emergency services (911 in the [place] Do not drive yourself to the hospital. Summary  Hyperglycemia is when the sugar (glucose) level in your blood is too high.  High blood sugar can happen to people who do or do not have diabetes.  Make sure you drink enough fluids, eat healthy foods, and exercise regularly.  Contact your doctor if you have problems keeping  your blood sugar in your target range. This information is not intended to replace advice given to you by your health care provider. Make sure you discuss any questions you have with your health care provider. Document Released: 09/05/2009 Document Revised: 07/26/2016 Document Reviewed: 07/26/2016 Elsevier Interactive Patient Education  2017 [employer].

## 2017-03-07 NOTE — Discharge Summary (Signed)
Physician Discharge Summary  Patient ID: [name] MRN: [id number] DOB/AGE: [birth year] 51 y.o.  Admit date: 03/07/2017 Discharge date: 03/07/2017  Admission Diagnoses:  Discharge Diagnoses:  Active Problems:   T2DM (type 2 diabetes mellitus) ([place])   Hyperglycemia   Discharged Condition: Stable   Hospital Course: Kimberly Herman is a 51 y.o. female prevents for intravenous hydration only for treatment of hyperglycemia.   Consults: N/A  Significant Diagnostic Studies:  Glycosylated hemoglobin-13.3  Last POC Glucose: 264  Treatments: 1/2 liter normal saline  Discharge Exam: Ambulatory, alert and oriented.  Disposition: 01-Home or Self Care   Allergies as of 03/07/2017   No Known Allergies    Signed: Molli Barrows 03/07/2017, 4:43 PM

## 2017-03-07 NOTE — H&P (Signed)
Kimberly Herman is an 51 y.o. female.    Chief Complaint: Elevated Blood Sugar   HPI:  Kimberly Herman is a 51 y.o. female presents for intravenous hydration for hyperglycemia. She presented today for a glucose and blood pressure check in the outpatient clinic and was found to be hypoglycemic with readings in the 400's and was administered 25 units in clinic today. Last blood sugar 349.   Past Medical History:  Diagnosis Date  . Diabetes mellitus without complication ([place])   . Hypertension     No past surgical history on file.  No family history on file. Social History:  reports that she has never smoked. She has never used smokeless tobacco. She reports that she drinks alcohol. She reports that she does not use drugs.  Allergies: No Known Allergies   (Not in a hospital admission)  Results for orders placed or performed in visit on 03/07/17 (from the past 48 hour(s))  Glucose, capillary     Status: Abnormal   Collection Time: 03/07/17  8:22 AM  Result Value Ref Range   Glucose-Capillary 423 (H) 65 - 99 mg/dL  Glucose, capillary     Status: Abnormal   Collection Time: 03/07/17  9:00 AM  Result Value Ref Range   Glucose-Capillary 431 (H) 65 - 99 mg/dL  Glucose, capillary     Status: Abnormal   Collection Time: 03/07/17  9:33 AM  Result Value Ref Range   Glucose-Capillary 349 (H) 65 - 99 mg/dL   No results found.   ROS  There were no vitals taken for this visit. Physical Exam  There were no vitals filed for this visit.    Assessment/Plan -Hyperglycemia- recently received 25 units of short acting insulin. Hydrate intravenously with normal saline at 100 ml/hr initially with glucose checks Qhourly.  Today's goal is to decrease glucose below 300 in order for patient to management blood sugars at home.  Molli Barrows, FNP 03/07/2017, 9:52 AM

## 2017-03-07 NOTE — Progress Notes (Signed)
Provider : Lavell Anchors  Diagnosis: Dehydration  Treatment: 0.9% Sodium Chloride infusion via IVPB   Patient tolerated procedure well. Patient's blood sugar decreased to 264. Patient alert, oriented, and ambulatory at time of discharge. Discharge instructions given to patient and patient states an understanding.

## 2017-03-09 MED ORDER — METRONIDAZOLE 0.75 % VA GEL: 1.0000 | Freq: Two times a day (BID) | VAGINAL | 0 refills | Status: AC

## 2017-03-09 MED FILL — VANDAZOLE VAGINAL 0.75% GEL: 0.75 | 7 days supply | Qty: 70 | Fill #0

## 2017-03-09 NOTE — Addendum Note (Signed)
Addended by: Scot Jun on: 03/09/2017 01:43 PM   Modules accepted: Orders

## 2017-03-10 ENCOUNTER — Ambulatory Visit: Payer: Self-pay

## 2017-03-14 ENCOUNTER — Ambulatory Visit (INDEPENDENT_AMBULATORY_CARE_PROVIDER_SITE_OTHER): Payer: Self-pay | Admitting: Family Medicine

## 2017-03-14 ENCOUNTER — Encounter: Payer: Self-pay | Admitting: Family Medicine

## 2017-03-14 VITALS — BP 158/80 | HR 78 | Temp 98.6°F | Resp 16 | Ht 68.0 in | Wt 203.0 lb

## 2017-03-14 DIAGNOSIS — Z794 Long term (current) use of insulin: Secondary | ICD-10-CM

## 2017-03-14 DIAGNOSIS — E119 Type 2 diabetes mellitus without complications: Secondary | ICD-10-CM

## 2017-03-14 DIAGNOSIS — I10 Essential (primary) hypertension: Secondary | ICD-10-CM

## 2017-03-14 MED ORDER — HYDROCHLOROTHIAZIDE 25 MG PO TABS: 25.0000 mg | ORAL_TABLET | Freq: Every day | ORAL | 3 refills | Status: DC

## 2017-03-14 MED ORDER — INSULIN GLARGINE 100 UNIT/ML SOLOSTAR PEN: 20.0000 [IU] | PEN_INJECTOR | Freq: Every day | SUBCUTANEOUS | 99 refills | Status: DC

## 2017-03-14 MED ORDER — INSULIN ASPART 100 UNIT/ML ~~LOC~~ SOLN: 10.0000 [IU] | Freq: Three times a day (TID) | SUBCUTANEOUS | 11 refills | Status: DC

## 2017-03-14 MED FILL — HYDROCHLOROTHIAZIDE 25 MG T: 25 | 30 days supply | Qty: 30 | Fill #0

## 2017-03-14 NOTE — Progress Notes (Signed)
Patient ID: JONAE RENSHAW, female    DOB: [birth year], 51 y.o.   MRN: [id number]  PCP: Molli Barrows, FNP  Chief Complaint  Patient presents with  . Follow-up    1 WEEK/ SUGAR WAS 72 THIS MORNING    Subjective:  HPI DAIANA VITIELLO is a 51 y.o. female presents for evaluation of diabetes and hypertension follow-up. Pamala was recently diagnosed with diabetes and hypertension and placed on antihypertension  Checks her blood sugar fasting in the morning and blood sugars have ranged in the 200-300 range. She reports that she experienced a reading over the last week in the 600 and a few in 400's. She has been keeping a log of readings and admits to a deficit of knowledge of what she should and shouldn't eat.  Reports that she has been taking her blood pressure medication but hasn't been monitoring blood pressure at home. Has had some blurring of vision and headaches over the last week. Reports unilateral swelling in left ankle. Denies any acute onset cough, chest pain, shortness breath or dizziness.  Social History   Social History  . Marital status: Single    Spouse name: N/A  . Number of children: N/A  . Years of education: N/A   Occupational History  . Not on file.   Social History Main Topics  . Smoking status: Never Smoker  . Smokeless tobacco: Never Used  . Alcohol use Yes  . Drug use: No  . Sexual activity: Not on file   Other Topics Concern  . Not on file   Social History Narrative  . No narrative on file    History reviewed. No pertinent family history. Review of Systems See HPI  Patient Active Problem List   Diagnosis Date Noted  . T2DM (type 2 diabetes mellitus) ([place]) 03/07/2017  . Hyperglycemia 03/07/2017  . HTN (hypertension) 03/07/2017  . ANEMIA, IRON DEFICIENCY, UNSPEC. 01/19/2007    No Known Allergies  Prior to Admission medications   Medication Sig Start Date End Date Taking? Authorizing Provider  amLODipine (NORVASC) 5 MG tablet Take 1  tablet (5 mg total) by mouth daily. 03/04/17  Yes Malvin Johns, MD  blood glucose meter kit and supplies KIT Dispense based on patient and insurance preference. Use up to four times daily as directed. (FOR ICD-9 250.00, 250.01). 03/04/17  Yes Sedalia Muta, FNP  Blood Pressure Monitoring (BLOOD PRESSURE CUFF) MISC Check blood pressure once daily at the same time and record readings 03/07/17  Yes Sedalia Muta, FNP  insulin aspart (NOVOLOG) 100 UNIT/ML injection Inject 5 Units into the skin 3 (three) times daily before meals. 03/07/17  Yes Sedalia Muta, FNP  Insulin Glargine (LANTUS SOLOSTAR) 100 UNIT/ML Solostar Pen Inject 10 Units into the skin daily at 10 pm. 03/04/17  Yes Sedalia Muta, FNP  metFORMIN (GLUCOPHAGE) 500 MG tablet Take 1 tablet (500 mg total) by mouth 2 (two) times daily with a meal. 03/04/17  Yes Malvin Johns, MD  metroNIDAZOLE (METROGEL VAGINAL) 0.75 % vaginal gel Place 1 Applicatorful vaginally 2 (two) times daily. 03/09/17 03/14/17 Yes Sedalia Muta, FNP  simvastatin (ZOCOR) 20 MG tablet Take 1 tablet (20 mg total) by mouth daily. 03/06/17  Yes Sedalia Muta, FNP    Past Medical, Surgical Family and Social History reviewed and updated.    Objective:   Vitals:   03/14/17 0807  BP: (!) 158/80  Pulse: 78  Resp: 16  Temp: 98.6 F (37 C)  Wt Readings from Last 3 Encounters:  03/14/17 203 lb (92.1 kg)  03/07/17 201 lb (91.2 kg)  03/04/17 200 lb (90.7 kg)    Physical Exam  Constitutional: She is oriented to person, place, and time. She appears well-developed and well-nourished.  HENT:  Head: Normocephalic and atraumatic.  Eyes: Conjunctivae and EOM are normal. Pupils are equal, round, and reactive to light.  Neck: Normal range of motion. Neck supple.  Cardiovascular: Normal rate, regular rhythm, normal heart sounds and intact distal pulses.   Pulmonary/Chest: Effort normal and breath sounds  normal.  Musculoskeletal: She exhibits edema. She exhibits no tenderness.  Neurological: She is alert and oriented to person, place, and time.  Skin: Skin is warm and dry.  Psychiatric: She has a normal mood and affect. Her behavior is normal. Judgment and thought content normal.     Assessment & Plan:  1. Essential hypertension  -Continue Amlodipine 5 mg  -Add Hydrochlorothiazide  25 mg once daily  2. Type 2 diabetes mellitus without complication, with long-term current use of insulin (HCC) - Insulin Glargine (LANTUS SOLOSTAR) 100 UNIT/ML Solostar Pen; Inject 20 Units into the skin daily at 10 pm.   Increased from 10 units at 10 pm - insulin aspart (NOVOLOG) 100 UNIT/ML injection; Inject 10 Units into the skin 3 (three) times daily before meals.  If blood sugar less than 120-do not administer -increased from 5 units 3 times daily with meals   RTC: 2 weeks diabetes recheck    Carroll Sage. [name], MSN, [facility] [address] [place], [place] [address] [phone]

## 2017-03-14 NOTE — Patient Instructions (Addendum)
Based on your readings over the last week, we will adjust your insulin as follows:  Increase your nighttime Lantus from 10 units to 20 units at 10 pm bedtime  Increase your rapid acting Novolog from 5 units before meals to 10 units before meals.  If your blood sugar is less than 120, hold dose of meal time insulin.  Please call me and let me know if you persistently have readings greater than 400.   For blood pressure, I am adding Hydrochlorothiazide 25 mg daily for blood pressure and fluid retention,  If swelling continues, we may need to change your blood pressure medication.  Keep a log of your blood pressure readings. Goal is less than 140/80    Diabetes Mellitus and Exercise Exercising regularly is important for your overall health, especially when you have diabetes (diabetes mellitus). Exercising is not only about losing weight. It has many health benefits, such as increasing muscle strength and bone density and reducing body fat and stress. This leads to improved fitness, flexibility, and endurance, all of which result in better overall health. Exercise has additional benefits for people with diabetes, including:  Reducing appetite.  Helping to lower and control blood glucose.  Lowering blood pressure.  Helping to control amounts of fatty substances (lipids) in the blood, such as cholesterol and triglycerides.  Helping the body to respond better to insulin (improving insulin sensitivity).  Reducing how much insulin the body needs.  Decreasing the risk for heart disease by:  Lowering cholesterol and triglyceride levels.  Increasing the levels of good cholesterol.  Lowering blood glucose levels. What is my activity plan? Your health care provider or certified diabetes educator can help you make a plan for the type and frequency of exercise (activity plan) that works for you. Make sure that you:  Do at least 150 minutes of moderate-intensity or vigorous-intensity  exercise each week. This could be brisk walking, biking, or water aerobics.  Do stretching and strength exercises, such as yoga or weightlifting, at least 2 times a week.  Spread out your activity over at least 3 days of the week.  Get some form of physical activity every day.  Do not go more than 2 days in a row without some kind of physical activity.  Avoid being inactive for more than 90 minutes at a time. Take frequent breaks to walk or stretch.  Choose a type of exercise or activity that you enjoy, and set realistic goals.  Start slowly, and gradually increase the intensity of your exercise over time. What do I need to know about managing my diabetes?  Check your blood glucose before and after exercising.  If your blood glucose is higher than 240 mg/dL (13.3 mmol/L) before you exercise, check your urine for ketones. If you have ketones in your urine, do not exercise until your blood glucose returns to normal.  Know the symptoms of low blood glucose (hypoglycemia) and how to treat it. Your risk for hypoglycemia increases during and after exercise. Common symptoms of hypoglycemia can include:  Hunger.  Anxiety.  Sweating and feeling clammy.  Confusion.  Dizziness or feeling light-headed.  Increased heart rate or palpitations.  Blurry vision.  Tingling or numbness around the mouth, lips, or tongue.  Tremors or shakes.  Irritability.  Keep a rapid-acting carbohydrate snack available before, during, and after exercise to help prevent or treat hypoglycemia.  Avoid injecting insulin into areas of the body that are going to be exercised. For example, avoid injecting insulin into:  The arms, when playing tennis.  The legs, when jogging.  Keep records of your exercise habits. Doing this can help you and your health care provider adjust your diabetes management plan as needed. Write down:  Food that you eat before and after you exercise.  Blood glucose levels before  and after you exercise.  The type and amount of exercise you have done.  When your insulin is expected to peak, if you use insulin. Avoid exercising at times when your insulin is peaking.  When you start a new exercise or activity, work with your health care provider to make sure the activity is safe for you, and to adjust your insulin, medicines, or food intake as needed.  Drink plenty of water while you exercise to prevent dehydration or heat stroke. Drink enough fluid to keep your urine clear or pale yellow. This information is not intended to replace advice given to you by your health care provider. Make sure you discuss any questions you have with your health care provider. Document Released: 01/29/2004 Document Revised: 05/28/2016 Document Reviewed: 04/19/2016 Elsevier Interactive Patient Education  2017 [place]. Diabetes Mellitus and Food It is important for you to manage your blood sugar (glucose) level. Your blood glucose level can be greatly affected by what you eat. Eating healthier foods in the appropriate amounts throughout the day at about the same time each day will help you control your blood glucose level. It can also help slow or prevent worsening of your diabetes mellitus. Healthy eating may even help you improve the level of your blood pressure and reach or maintain a healthy weight. General recommendations for healthful eating and cooking habits include:  Eating meals and snacks regularly. Avoid going long periods of time without eating to lose weight.  Eating a diet that consists mainly of plant-based foods, such as fruits, vegetables, nuts, legumes, and whole grains.  Using low-heat cooking methods, such as baking, instead of high-heat cooking methods, such as deep frying. Work with your dietitian to make sure you understand how to use the Nutrition Facts information on food labels. How can food affect me? Carbohydrates  Carbohydrates affect your blood glucose  level more than any other type of food. Your dietitian will help you determine how many carbohydrates to eat at each meal and teach you how to count carbohydrates. Counting carbohydrates is important to keep your blood glucose at a healthy level, especially if you are using insulin or taking certain medicines for diabetes mellitus. Alcohol  Alcohol can cause sudden decreases in blood glucose (hypoglycemia), especially if you use insulin or take certain medicines for diabetes mellitus. Hypoglycemia can be a life-threatening condition. Symptoms of hypoglycemia (sleepiness, dizziness, and disorientation) are similar to symptoms of having too much alcohol. If your health care provider has given you approval to drink alcohol, do so in moderation and use the following guidelines:  Women should not have more than one drink per day, and men should not have more than two drinks per day. One drink is equal to:  12 oz of beer.  5 oz of wine.  1 oz of hard liquor.  Do not drink on an empty stomach.  Keep yourself hydrated. Have water, diet soda, or unsweetened iced tea.  Regular soda, juice, and other mixers might contain a lot of carbohydrates and should be counted. What foods are not recommended? As you make food choices, it is important to remember that all foods are not the same. Some foods have fewer nutrients  per serving than other foods, even though they might have the same number of calories or carbohydrates. It is difficult to get your body what it needs when you eat foods with fewer nutrients. Examples of foods that you should avoid that are high in calories and carbohydrates but low in nutrients include:  Trans fats (most processed foods list trans fats on the Nutrition Facts label).  Regular soda.  Juice.  Candy.  Sweets, such as cake, pie, doughnuts, and cookies.  Fried foods. What foods can I eat? Eat nutrient-rich foods, which will nourish your body and keep you healthy. The food  you should eat also will depend on several factors, including:  The calories you need.  The medicines you take.  Your weight.  Your blood glucose level.  Your blood pressure level.  Your cholesterol level. You should eat a variety of foods, including:  Protein.  Lean cuts of meat.  Proteins low in saturated fats, such as fish, egg whites, and beans. Avoid processed meats.  Fruits and vegetables.  Fruits and vegetables that may help control blood glucose levels, such as apples, mangoes, and yams.  Dairy products.  Choose fat-free or low-fat dairy products, such as milk, yogurt, and cheese.  Grains, bread, pasta, and rice.  Choose whole grain products, such as multigrain bread, whole oats, and brown rice. These foods may help control blood pressure.  Fats.  Foods containing healthful fats, such as nuts, avocado, olive oil, canola oil, and fish. Does everyone with diabetes mellitus have the same meal plan? Because every person with diabetes mellitus is different, there is not one meal plan that works for everyone. It is very important that you meet with a dietitian who will help you create a meal plan that is just right for you. This information is not intended to replace advice given to you by your health care provider. Make sure you discuss any questions you have with your health care provider. Document Released: 08/05/2005 Document Revised: 04/15/2016 Document Reviewed: 10/05/2013 Elsevier Interactive Patient Education  2017 [place].  Hypoglycemia  Hypoglycemia is when the sugar (glucose) level in the blood is too low. Symptoms of low blood sugar may include:  Feeling:  Hungry.  Worried or nervous (anxious).  Sweaty and clammy.  Confused.  Dizzy.  Sleepy.  Sick to your stomach (nauseous).  Having:  A fast heartbeat.  A headache.  A change in your vision.  Jerky movements that you cannot control (seizure).  Nightmares.  Tingling or no  feeling (numbness) around the mouth, lips, or tongue.  Having trouble with:  Talking.  Paying attention (concentrating).  Moving (coordination).  Sleeping.  Shaking.  Passing out (fainting).  Getting upset easily (irritability). Low blood sugar can happen to people who have diabetes and people who do not have diabetes. Low blood sugar can happen quickly, and it can be an emergency. Treating Low Blood Sugar  Low blood sugar is often treated by eating or drinking something sugary right away. If you can think clearly and swallow safely, follow the 15:15 rule:  Take 15 grams of a fast-acting carb (carbohydrate). Some fast-acting carbs are:  1 tube of glucose gel.  3 sugar tablets (glucose pills).  6-8 pieces of hard candy.  4 oz (120 mL) of fruit juice.  4 oz (120 mL) of regular (not diet) soda.  Check your blood sugar 15 minutes after you take the carb.  If your blood sugar is still at or below 70 mg/dL (3.9 mmol/L),  take 15 grams of a carb again.  If your blood sugar does not go above 70 mg/dL (3.9 mmol/L) after 3 tries, get help right away.  After your blood sugar goes back to normal, eat a meal or a snack within 1 hour. Treating Very Low Blood Sugar  If your blood sugar is at or below 54 mg/dL (3 mmol/L), you have very low blood sugar (severe hypoglycemia). This is an emergency. Do not wait to see if the symptoms will go away. Get medical help right away. Call your local emergency services (911 in the [place] Do not drive yourself to the hospital. If you have very low blood sugar and you cannot eat or drink, you may need a glucagon shot (injection). A family member or friend should learn how to check your blood sugar and how to give you a glucagon shot. Ask your doctor if you need to have a glucagon shot kit at home. Follow these instructions at home: General instructions   Avoid any diets that cause you to not eat enough food. Talk with your doctor before you start any  new diet.  Take over-the-counter and prescription medicines only as told by your doctor.  Limit alcohol to no more than 1 drink per day for nonpregnant women and 2 drinks per day for men. One drink equals 12 oz of beer, 5 oz of wine, or 1 oz of hard liquor.  Keep all follow-up visits as told by your doctor. This is important. If You Have Diabetes:    Make sure you know the symptoms of low blood sugar.  Always keep a source of sugar with you, such as:  Sugar.  Sugar tablets.  Glucose gel.  Fruit juice.  Regular soda (not diet soda).  Milk.  Hard candy.  Honey.  Take your medicines as told.  Follow your exercise and meal plan.  Eat on time. Do not skip meals.  Follow your sick day plan when you cannot eat or drink normally. Make this plan ahead of time with your doctor.  Check your blood sugar as often as told by your doctor. Always check before and after exercise.  Share your diabetes care plan with:  Your work or school.  People you live with.  Check your pee (urine) for ketones:  When you are sick.  As told by your doctor.  Carry a card or wear jewelry that says you have diabetes. If You Have Low Blood Sugar From Other Causes:    Check your blood sugar as often as told by your doctor.  Follow instructions from your doctor about what you cannot eat or drink. Contact a doctor if:  You have trouble keeping your blood sugar in your target range.  You have low blood sugar often. Get help right away if:  You still have symptoms after you eat or drink something sugary.  Your blood sugar is at or below 54 mg/dL (3 mmol/L).  You have jerky movements that you cannot control.  You pass out. These symptoms may be an emergency. Do not wait to see if the symptoms will go away. Get medical help right away. Call your local emergency services (911 in the [place] Do not drive yourself to the hospital. This information is not intended to replace advice given to  you by your health care provider. Make sure you discuss any questions you have with your health care provider. Document Released: 02/02/2010 Document Revised: 04/15/2016 Document Reviewed: 12/12/2015 Elsevier Interactive Patient Education  2017  [employer].

## 2017-03-15 ENCOUNTER — Telehealth: Payer: Self-pay

## 2017-03-15 NOTE — Telephone Encounter (Signed)
Patient called to advise you that her blood sugar was down to 166 fasting this am. Thanks!

## 2017-03-17 ENCOUNTER — Telehealth: Payer: Self-pay

## 2017-03-17 NOTE — Telephone Encounter (Signed)
Patient notified

## 2017-03-17 NOTE — Telephone Encounter (Signed)
Blood sugar is stable. Blood pressure is uncontrolled. I would like for her to continue Amlodipine instead of taking 1 tablet, take 1 tablet and half of a second tablet for a total dose of 7.5 mg.

## 2017-03-21 ENCOUNTER — Other Ambulatory Visit: Payer: Self-pay | Admitting: *Deleted

## 2017-03-21 DIAGNOSIS — E119 Type 2 diabetes mellitus without complications: Secondary | ICD-10-CM

## 2017-03-21 DIAGNOSIS — Z794 Long term (current) use of insulin: Principal | ICD-10-CM

## 2017-03-21 MED ORDER — INSULIN GLARGINE 100 UNIT/ML SOLOSTAR PEN: 20.0000 [IU] | PEN_INJECTOR | Freq: Every day | SUBCUTANEOUS | 3 refills | Status: DC

## 2017-03-21 MED ORDER — INSULIN LISPRO 100 UNIT/ML (KWIKPEN): 5.0000 [IU] | PEN_INJECTOR | Freq: Three times a day (TID) | SUBCUTANEOUS | 3 refills | Status: DC

## 2017-03-21 NOTE — Telephone Encounter (Signed)
PRINTED FOR PASS PROGRAM 

## 2017-03-28 ENCOUNTER — Encounter: Payer: Self-pay | Admitting: Family Medicine

## 2017-03-28 ENCOUNTER — Ambulatory Visit (INDEPENDENT_AMBULATORY_CARE_PROVIDER_SITE_OTHER): Payer: Self-pay | Admitting: Family Medicine

## 2017-03-28 VITALS — BP 140/72 | HR 76 | Temp 98.6°F | Resp 16 | Ht 68.0 in | Wt 203.0 lb

## 2017-03-28 DIAGNOSIS — E1100 Type 2 diabetes mellitus with hyperosmolarity without nonketotic hyperglycemic-hyperosmolar coma (NKHHC): Secondary | ICD-10-CM

## 2017-03-28 DIAGNOSIS — E119 Type 2 diabetes mellitus without complications: Secondary | ICD-10-CM

## 2017-03-28 DIAGNOSIS — I10 Essential (primary) hypertension: Secondary | ICD-10-CM

## 2017-03-28 DIAGNOSIS — Z794 Long term (current) use of insulin: Secondary | ICD-10-CM

## 2017-03-28 LAB — POCT URINALYSIS DIP (DEVICE)
Bilirubin Urine: NEGATIVE
Glucose, UA: NEGATIVE mg/dL
Ketones, ur: NEGATIVE mg/dL
Nitrite: NEGATIVE
PH: 7 (ref 5.0–8.0)
PROTEIN: NEGATIVE mg/dL
SPECIFIC GRAVITY, URINE: 1.015 (ref 1.005–1.030)
UROBILINOGEN UA: 0.2 mg/dL (ref 0.0–1.0)

## 2017-03-28 LAB — GLUCOSE, CAPILLARY: GLUCOSE-CAPILLARY: 137 mg/dL — AB (ref 65–99)

## 2017-03-28 MED ORDER — METFORMIN HCL 500 MG PO TABS: 500.0000 mg | ORAL_TABLET | Freq: Three times a day (TID) | ORAL | 3 refills | Status: DC

## 2017-03-28 MED ORDER — INSULIN GLARGINE 100 UNIT/ML SOLOSTAR PEN: 20.0000 [IU] | PEN_INJECTOR | Freq: Every day | SUBCUTANEOUS | 3 refills | Status: DC

## 2017-03-28 MED ORDER — SAXAGLIPTIN HCL 5 MG PO TABS: 5.0000 mg | ORAL_TABLET | Freq: Every day | ORAL | 0 refills | Status: DC

## 2017-03-28 MED ORDER — INSULIN ASPART 100 UNIT/ML ~~LOC~~ SOLN: 10.0000 [IU] | SUBCUTANEOUS | 4 refills | Status: DC | PRN

## 2017-03-28 MED FILL — ?METFORMIN HCL 500MG TABLET: 500 | 30 days supply | Qty: 90 | Fill #0

## 2017-03-28 MED FILL — !LANTUS SOLOSTAR 100UNITS/M: 100 | 30 days supply | Qty: 6 | Fill #0

## 2017-03-28 NOTE — Progress Notes (Signed)
Patient ID: Kimberly Herman, female    DOB: 1966/03/09, 51 y.o.   MRN: [id number]  PCP: Scot Jun, FNP  Chief Complaint  Patient presents with  . Follow-up    2 WEEK    Subjective:  HPI Kimberly Herman is a 51 y.o. female presents for evaluation of diabetes and hypertension 2 week follow-up. Kimberly Herman was recently diagnosed with diabetes and hypertension and placed on antihypertension medication, basal and short-acting insulin and metformin 500 BID. Most recent fasting morning blood sugars readings have been as follows: 95, 99, 118, 116, 123 She reports 1 hypoglycemic event recently in which she injected 10 units of short-acting insulin, did not eat, took a nap and awakened with a glucose of 55. With food intake reading increased to 115. No additional episodes of hypoglycemia. Over the last week she has been absent of readings exceeding 200.  Blood pressures have remained difficult to control. At last office visit, 2 weeks prior, HCTZ 25 mg was added to her current regimen of amlodipine 5 mg. She reports today that she hasn't started taking the HCTZ as she was concerned about urinary frequency. Complains of persistent bilateral ankle swelling, which she admits today has been present for several years. Purchased a wrist blood pressure cuff, and admits to not routinely monitoring blood pressure, however reports two readings this week greater than 160/90 via the wrist cuff. Continues to express concern regarding worsening visual acuity. She was denied the orange card due to her salary is more than the income requirement. She has not vision insurance. Denies any acute onset cough, chest pain, shortness breath or dizziness.   Social History   Social History  . Marital status: Single    Spouse name: N/A  . Number of children: N/A  . Years of education: N/A   Occupational History  . Not on file.   Social History Main Topics  . Smoking status: Never Smoker  . Smokeless tobacco: Never  Used  . Alcohol use Yes  . Drug use: No  . Sexual activity: Not on file   Other Topics Concern  . Not on file   Social History Narrative  . No narrative on file   Review of Systems See HPI Patient Active Problem List   Diagnosis Date Noted  . T2DM (type 2 diabetes mellitus) ([place]) 03/07/2017  . Hyperglycemia 03/07/2017  . HTN (hypertension) 03/07/2017  . ANEMIA, IRON DEFICIENCY, UNSPEC. 01/19/2007    No Known Allergies  Prior to Admission medications   Medication Sig Start Date End Date Taking? Authorizing Provider  amLODipine (NORVASC) 5 MG tablet Take 1 tablet (5 mg total) by mouth daily. 03/04/17   Malvin Johns, MD  blood glucose meter kit and supplies KIT Dispense based on patient and insurance preference. Use up to four times daily as directed. (FOR ICD-9 250.00, 250.01). 03/04/17   Scot Jun, FNP  Blood Pressure Monitoring (BLOOD PRESSURE CUFF) MISC Check blood pressure once daily at the same time and record readings 03/07/17   Scot Jun, FNP  hydrochlorothiazide (HYDRODIURIL) 25 MG tablet Take 1 tablet (25 mg total) by mouth daily. 03/14/17   Scot Jun, FNP  insulin aspart (NOVOLOG) 100 UNIT/ML injection Inject 10 Units into the skin 3 (three) times daily before meals. If blood sugar less than 120-do not administer dose 03/14/17   Scot Jun, FNP  Insulin Glargine (LANTUS SOLOSTAR) 100 UNIT/ML Solostar Pen Inject 20 Units into the skin daily at 10 pm. 03/21/17  Tresa Garter, MD  insulin lispro (HUMALOG KWIKPEN) 100 UNIT/ML KiwkPen Inject 0.05 mLs (5 Units total) into the skin 3 (three) times daily. 03/21/17   Tresa Garter, MD  metFORMIN (GLUCOPHAGE) 500 MG tablet Take 1 tablet (500 mg total) by mouth 2 (two) times daily with a meal. 03/04/17   Malvin Johns, MD  simvastatin (ZOCOR) 20 MG tablet Take 1 tablet (20 mg total) by mouth daily. 03/06/17   Scot Jun, FNP    Past Medical, Surgical Family and Social History  reviewed and updated.    Objective:   Today's Vitals   03/28/17 0812 03/28/17 0825  BP: (!) 160/80 (!) 146/80  Pulse: 76   Resp: 16   Temp: 98.6 F (37 C)   TempSrc: Oral   SpO2: 100%   Weight: 203 lb (92.1 kg)   Height: '5\' 8"'  (1.727 m)     Wt Readings from Last 3 Encounters:  03/14/17 203 lb (92.1 kg)  03/07/17 201 lb (91.2 kg)  03/04/17 200 lb (90.7 kg)    Physical Exam  Constitutional: She is oriented to person, place, and time. She appears well-developed and well-nourished.  HENT:  Head: Normocephalic and atraumatic.  Eyes: Conjunctivae are normal. Pupils are equal, round, and reactive to light.  Cardiovascular: Normal rate, regular rhythm, normal heart sounds and intact distal pulses.   Pulmonary/Chest: Effort normal and breath sounds normal.  Musculoskeletal: Normal range of motion.  Neurological: She is alert and oriented to person, place, and time.  Skin: Skin is warm and dry.  Psychiatric: She has a normal mood and affect. Her behavior is normal. Thought content normal.     Assessment & Plan:  1. Essential hypertension -Continue Amlodipine 5 mg once daily and resume HCTZ 25 mg daily.  2. Type 2 diabetes mellitus with hyperosmolarity without coma, unspecified whether long term insulin use (HCC) -Glucose reading controlled today 137 fasting. -Stop Novolog as readings are more controlled now.  Keep insulin and with refills as patient is advised to inject 10 units in the event she experiences an acute hyperglycemic episode in which glucose is acutely greater than 240 prior to meal time. Monitor expiration date and discard if insulin expires. -Adding Saxagliptin 5 mg tablets, daily. -Increased Metformin 500 mg three times daily -Continue Lantus, 20 units daily at bedtime. -If A1C is improved, will decrease Lantus from 20 Units to 15 units.  Goal is to achieve glycemic control to eventually not require insulin to management diabetes -Increase physical activity at  minium 15 minutes daily 2-3 days per week and increase gradually to recommended 150 minutes of routine exercise.  RTC:  2 weeks for blood pressure check. If greater than 140/90, adding lisinopril 10 mg. 2 months for hypertension and Diabetes follow-up.   Carroll Sage. [name], MSN, [facility] [address] [place], [place] [address] [phone]

## 2017-03-28 NOTE — Patient Instructions (Addendum)
-  Increase Metformin 500 mg three times daily. -I am adding Onglyza 5 mg once daily in the mornings. -Continue Lantus 20 units at bedtime  -Start taking your Hydrochlorothiazide 25 mg once daily to improve lower extremity swelling and blood pressure. -Continue Amlodipine 5 mg daily -Increase physical activity, recommended 150 minutes per day, but increase as tolerated.

## 2017-03-29 MED FILL — AMLODIPINE BESYLATE 5 MG TA: 5 | 30 days supply | Qty: 30 | Fill #1

## 2017-03-29 MED FILL — ONGLYZA 5 MG TABLET: 5 | 30 days supply | Qty: 30 | Fill #0

## 2017-04-04 ENCOUNTER — Ambulatory Visit: Payer: Self-pay | Admitting: Family Medicine

## 2017-04-11 ENCOUNTER — Ambulatory Visit (INDEPENDENT_AMBULATORY_CARE_PROVIDER_SITE_OTHER): Payer: Self-pay | Admitting: Family Medicine

## 2017-04-11 VITALS — BP 156/98

## 2017-04-11 DIAGNOSIS — Z0130 Encounter for examination of blood pressure without abnormal findings: Secondary | ICD-10-CM

## 2017-04-11 MED ORDER — LOSARTAN POTASSIUM 25 MG PO TABS: 25.0000 mg | ORAL_TABLET | Freq: Every day | ORAL | 0 refills | Status: DC

## 2017-04-11 MED FILL — LOSARTAN POTASSIUM 25 MG TA: 25 | 30 days supply | Qty: 30 | Fill #0

## 2017-04-11 NOTE — Progress Notes (Signed)
Nurse visit only for blood pressure recheck.   Vitals:   04/11/17 0856  BP: (!) 156/98   Blood pressure is not at goal. Adding losartan 25 mg once daily. If blood pressure remains not goal, will increase to 50 mg daily. We discussed that if blood pressure remains uncontrolled, a referral to cardiology is indicated.  She is in the process of getting health coverage and or [place] Patient assistance.

## 2017-04-14 MED FILL — HYDROCHLOROTHIAZIDE 25 MG T: 25 | 30 days supply | Qty: 30 | Fill #1

## 2017-04-28 MED FILL — $LANTUS SOLOSTAR 100 UNITS/: 100 | 30 days supply | Qty: 6 | Fill #1

## 2017-05-02 ENCOUNTER — Other Ambulatory Visit: Payer: Self-pay | Admitting: Family Medicine

## 2017-05-02 DIAGNOSIS — Z794 Long term (current) use of insulin: Principal | ICD-10-CM

## 2017-05-02 DIAGNOSIS — E119 Type 2 diabetes mellitus without complications: Secondary | ICD-10-CM

## 2017-05-02 MED FILL — LOSARTAN POTASSIUM 25 MG TA: 25 | 30 days supply | Qty: 30 | Fill #1

## 2017-05-02 MED FILL — AMLODIPINE BESYLATE 5 MG TA: 5 | 30 days supply | Qty: 30 | Fill #2

## 2017-05-03 ENCOUNTER — Other Ambulatory Visit: Payer: Self-pay | Admitting: Family Medicine

## 2017-05-03 MED ORDER — AMLODIPINE BESYLATE 5 MG PO TABS: 7.5000 mg | ORAL_TABLET | Freq: Every day | ORAL | 1 refills | Status: DC

## 2017-05-03 NOTE — Progress Notes (Signed)
Corrected dose of Amlodipine to 7.5. [facility] to [facility].

## 2017-05-09 ENCOUNTER — Other Ambulatory Visit: Payer: Self-pay | Admitting: Family Medicine

## 2017-05-09 DIAGNOSIS — E119 Type 2 diabetes mellitus without complications: Secondary | ICD-10-CM

## 2017-05-09 DIAGNOSIS — Z794 Long term (current) use of insulin: Principal | ICD-10-CM

## 2017-05-09 MED FILL — ONGLYZA 5 MG TABLET: 5 | 30 days supply | Qty: 30 | Fill #0

## 2017-05-11 ENCOUNTER — Other Ambulatory Visit: Payer: Self-pay | Admitting: Family Medicine

## 2017-05-17 ENCOUNTER — Other Ambulatory Visit: Payer: Self-pay | Admitting: Family Medicine

## 2017-05-17 DIAGNOSIS — Z794 Long term (current) use of insulin: Principal | ICD-10-CM

## 2017-05-17 DIAGNOSIS — E119 Type 2 diabetes mellitus without complications: Secondary | ICD-10-CM

## 2017-05-20 ENCOUNTER — Telehealth: Payer: Self-pay | Admitting: Family Medicine

## 2017-05-20 NOTE — Telephone Encounter (Signed)
Re: FMLA paperwork  Please advise Ms. Leibold upon review of the FMLA paperwork she submitted, the paperwork is actually for her husband to have FMLA for her chronic conditions of hypertension, hyperlipidemia, and diabetes. I am unable to complete the paperwork, as her husband has never accompanied her to any of her medical appointments and in my opinion her chronic conditions are stable and she is able to self administer her medications. If I complete the paperwork in accordance with her last office visit notes and my medical opinion, her husband would likely be ineligible for FMLA.   Carroll Sage. [name], MSN, FNP-C [facility]  [name] [place], [place] [address] [phone]

## 2017-05-20 NOTE — Telephone Encounter (Signed)
Left a vm for patient to callback regarding paperwork

## 2017-05-23 NOTE — Telephone Encounter (Signed)
Left a vm for patient to callback 

## 2017-05-26 NOTE — Telephone Encounter (Signed)
Sent a letter to patient to contact us as I have not been able to reach patient by phone.

## 2017-05-31 ENCOUNTER — Other Ambulatory Visit: Payer: Self-pay | Admitting: *Deleted

## 2017-05-31 DIAGNOSIS — E119 Type 2 diabetes mellitus without complications: Secondary | ICD-10-CM

## 2017-05-31 DIAGNOSIS — Z794 Long term (current) use of insulin: Principal | ICD-10-CM

## 2017-05-31 MED ORDER — SAXAGLIPTIN HCL 5 MG PO TABS: 5.0000 mg | ORAL_TABLET | Freq: Every day | ORAL | 3 refills | Status: DC

## 2017-05-31 NOTE — Telephone Encounter (Signed)
PRINTED FOR PASS PROGRAM 

## 2017-06-07 ENCOUNTER — Encounter: Payer: Self-pay | Admitting: Family Medicine

## 2017-06-07 ENCOUNTER — Ambulatory Visit (INDEPENDENT_AMBULATORY_CARE_PROVIDER_SITE_OTHER): Payer: Self-pay | Admitting: Family Medicine

## 2017-06-07 VITALS — BP 172/88 | HR 67 | Temp 98.7°F | Resp 16 | Ht 68.0 in | Wt 204.6 lb

## 2017-06-07 DIAGNOSIS — F4321 Adjustment disorder with depressed mood: Secondary | ICD-10-CM

## 2017-06-07 DIAGNOSIS — Z634 Disappearance and death of family member: Secondary | ICD-10-CM

## 2017-06-07 DIAGNOSIS — I10 Essential (primary) hypertension: Secondary | ICD-10-CM

## 2017-06-07 DIAGNOSIS — E1100 Type 2 diabetes mellitus with hyperosmolarity without nonketotic hyperglycemic-hyperosmolar coma (NKHHC): Secondary | ICD-10-CM

## 2017-06-07 LAB — POCT URINALYSIS DIP (DEVICE)
Bilirubin Urine: NEGATIVE
Glucose, UA: NEGATIVE mg/dL
HGB URINE DIPSTICK: NEGATIVE
Ketones, ur: NEGATIVE mg/dL
NITRITE: NEGATIVE
Protein, ur: NEGATIVE mg/dL
Specific Gravity, Urine: 1.015 (ref 1.005–1.030)
UROBILINOGEN UA: 0.2 mg/dL (ref 0.0–1.0)
pH: 7.5 (ref 5.0–8.0)

## 2017-06-07 LAB — POCT GLYCOSYLATED HEMOGLOBIN (HGB A1C): HEMOGLOBIN A1C: 5.8

## 2017-06-07 LAB — GLUCOSE, CAPILLARY: GLUCOSE-CAPILLARY: 111 mg/dL — AB (ref 65–99)

## 2017-06-07 NOTE — Progress Notes (Signed)
Patient ID: Kimberly Herman, female    DOB: 1966-06-22, 51 y.o.   MRN: [id number]  PCP: Scot Jun, FNP  Chief Complaint  Patient presents with  . Follow-up    2 MONTH ON Diabetes and blood pressure    Subjective:  HPI Kimberly Herman is a 51 y.o. female presents for evaluation of diabetes and hypertension. Kimberly Herman reports since her last visit here in office, that her youngest son was killed in an motor cycle accident. She reports that she is coping although remains in shock that her child is deceased. In spite of this loss and the planning for funeral, she reports that has continued to monitor her diabetes and adhere to her diabetic diet. Kimberly Herman takes that she is resting okay at night and denies depression or anxiety, although feels like "this isn't happening".  She has a strong spiritual faith and close family and relies on these two sources of support for emotional strength.  Diabetes: Last A1C was 13.6, 3 months ago, which is when Kimberly Herman was originally diagnosed as diabetic. She was prescribed at that time Lantus and Novolog until her blood sugar stabilized along with Metformin and Onglyza. Kimberly Herman has not achieved any weight loss over the last 4 months, although she has changed her diet from routine fast food to cooking more at home and substituting baked foods for fried. Current Body mass index is 31.11 kg/m. Continues to monitor glucose at home, and has not had any recent readings greater than 200. She has had a recent eye exam which was negative of any diabetic neuropathy or any other abnormal findings.  Hypertension:  Kimberly Herman reports home monitoring of blood pressure with erratic readings. Her home monitor is a wrist cuff and she is planning to purchase an arm cuff to obtain more precise readings. Her current blood pressure regimen consist of amlodipine 7.5 mg daily, losartan 25 mg daily, hydrochlorothiazide 25 mg daily. Reports adherence to blood pressure medications. Reports  efforts to adhere to low sodium diet. She is a nonsmoker. Denies any episodes of dizziness, headaches, shortness of breath, or chest pain.  Social History   Social History  . Marital status: Single    Spouse name: N/A  . Number of children: N/A  . Years of education: N/A   Social History Main Topics  . Smoking status: Never Smoker  . Smokeless tobacco: Never Used  . Alcohol use Yes  . Drug use: No  . Sexual activity: Not Asked   Other Topics Concern  . None   Social History Narrative   Patient recently lost youngest son he was killed in a motor cycle accident at age 75. Kimberly Herman 2018   History reviewed. No pertinent family history. Review of Systems See history of present illness Patient Active Problem List   Diagnosis Date Noted  . T2DM (type 2 diabetes mellitus) ([place]) 03/07/2017  . Hyperglycemia 03/07/2017  . HTN (hypertension) 03/07/2017  . ANEMIA, IRON DEFICIENCY, UNSPEC. 01/19/2007    No Known Allergies  Prior to Admission medications   Medication Sig Start Date End Date Taking? Authorizing Provider  amLODipine (NORVASC) 5 MG tablet Take 1.5 tablets (7.5 mg total) by mouth daily. 05/03/17  Yes Scot Jun, FNP  blood glucose meter kit and supplies KIT Dispense based on patient and insurance preference. Use up to four times daily as directed. (FOR ICD-9 250.00, 250.01). 03/04/17  Yes Scot Jun, FNP  Blood Pressure Monitoring (BLOOD PRESSURE CUFF) MISC Check blood pressure  once daily at the same time and record readings 03/07/17  Yes Scot Jun, FNP  glucose blood (TRUE METRIX BLOOD GLUCOSE TEST) test strip TEST UP TO FOUR TIMES DAILY 05/11/17  Yes Scot Jun, FNP  hydrochlorothiazide (HYDRODIURIL) 25 MG tablet Take 1 tablet (25 mg total) by mouth daily. 03/14/17  Yes Scot Jun, FNP  insulin aspart (NOVOLOG) 100 UNIT/ML injection Inject 10 Units into the skin as needed for high blood sugar (blood sugar >240). 03/28/17  Yes Scot Jun, FNP  Insulin Glargine (LANTUS SOLOSTAR) 100 UNIT/ML Solostar Pen Inject 20 Units into the skin daily at 10 pm. 03/28/17  Yes Scot Jun, FNP  losartan (COZAAR) 25 MG tablet Take 1 tablet (25 mg total) by mouth daily. 04/11/17  Yes Scot Jun, FNP  metFORMIN (GLUCOPHAGE) 500 MG tablet Take 1 tablet (500 mg total) by mouth 3 (three) times daily with meals. 03/28/17  Yes Scot Jun, FNP  ONGLYZA 5 MG TABS tablet TAKE 1 TABLET BY MOUTH DAILY. 05/03/17  Yes Scot Jun, FNP  saxagliptin HCl (ONGLYZA) 5 MG TABS tablet Take 1 tablet (5 mg total) by mouth daily. 05/31/17  Yes Tresa Garter, MD  simvastatin (ZOCOR) 20 MG tablet Take 1 tablet (20 mg total) by mouth daily. 03/06/17  Yes Scot Jun, FNP    Past Medical, Surgical Family and Social History reviewed and updated.    Objective:   Vitals:   06/07/17 0929  BP: (!) 172/88  Pulse: 67  Resp: 16  Temp: 98.7 F (37.1 C)   Wt Readings from Last 3 Encounters:  06/07/17 204 lb 9.6 oz (92.8 kg)  03/28/17 203 lb (92.1 kg)  03/14/17 203 lb (92.1 kg)   Physical Exam  Constitutional: She is oriented to person, place, and time. She appears well-developed and well-nourished.  HENT:  Head: Normocephalic and atraumatic.  Eyes: Pupils are equal, round, and reactive to light. Conjunctivae and EOM are normal.  Neck: Normal range of motion. Neck supple.  Cardiovascular: Normal rate, regular rhythm, normal heart sounds and intact distal pulses.   Pulmonary/Chest: Effort normal and breath sounds normal.  Abdominal: Soft. Bowel sounds are normal.  Neurological: She is alert and oriented to person, place, and time.  Skin: Skin is warm and dry.  Psychiatric: She has a normal mood and affect. Her behavior is normal. Thought content normal.   Assessment & Plan:  1. Type 2 diabetes mellitus with hyperosmolarity without coma, unspecified whether long term insulin use (HCC) - POCT glycosylated hemoglobin (Hb A1C) 5.8  at goal. -Discontinue Lantus 20 units at bedtime. If A1c again stressed to trend upward greater than 7 will consider adding Lantus back to regimen.  2. Essential hypertension, uncontrolled today. -Recheck was significantly lower than what is documented however CMA did not record verifies blood pressure check. -Patient is to return in 3 weeks for repeat blood pressure check and should have taken all medication prior to visit.  3. Grief at loss of child -Encouraged to seek grief counseling resources if needed and also can follow up here in office if depressive or anxiety symptoms develop.  Return for care 4 weeks for blood pressure check 3 months for diabetes and hypertension follow-up.  Carroll Sage. [name], MSN, FNP-C [facility]  [address] [name] [place], [place] [address] [phone]

## 2017-06-08 MED FILL — !ONGLYZA 5 MG TABLET: 5 | 30 days supply | Qty: 30 | Fill #0

## 2017-06-08 MED FILL — LOSARTAN POTASSIUM 25 MG TA: 25 | 30 days supply | Qty: 30 | Fill #2

## 2017-06-08 MED FILL — AMLODIPINE BESYLATE 5 MG TA: 5 | 30 days supply | Qty: 30 | Fill #3

## 2017-06-08 MED FILL — ?METFORMIN HCL 500MG TABLET: 500 | 30 days supply | Qty: 90 | Fill #1

## 2017-06-08 MED FILL — HYDROCHLOROTHIAZIDE 25 MG T: 25 | 30 days supply | Qty: 30 | Fill #2

## 2017-06-11 ENCOUNTER — Encounter: Payer: Self-pay | Admitting: Family Medicine

## 2017-06-11 MED ORDER — LOSARTAN POTASSIUM 25 MG PO TABS: 25.0000 mg | ORAL_TABLET | Freq: Every day | ORAL | 3 refills | Status: DC

## 2017-07-05 ENCOUNTER — Ambulatory Visit (INDEPENDENT_AMBULATORY_CARE_PROVIDER_SITE_OTHER): Payer: Self-pay | Admitting: Family Medicine

## 2017-07-05 VITALS — BP 144/86 | Ht 68.0 in

## 2017-07-05 DIAGNOSIS — Z719 Counseling, unspecified: Secondary | ICD-10-CM

## 2017-07-05 NOTE — Progress Notes (Signed)
Patient admits to not taking blood pressure medication today . Advise to return home take medication and keep follow-up scheduled for October.

## 2017-08-25 ENCOUNTER — Other Ambulatory Visit: Payer: Self-pay | Admitting: Family Medicine

## 2017-08-25 DIAGNOSIS — Z794 Long term (current) use of insulin: Principal | ICD-10-CM

## 2017-08-25 DIAGNOSIS — E119 Type 2 diabetes mellitus without complications: Secondary | ICD-10-CM

## 2017-09-07 ENCOUNTER — Encounter: Payer: Self-pay | Admitting: Family Medicine

## 2017-09-07 ENCOUNTER — Ambulatory Visit (INDEPENDENT_AMBULATORY_CARE_PROVIDER_SITE_OTHER): Payer: Self-pay | Admitting: Family Medicine

## 2017-09-07 VITALS — BP 146/86 | HR 70 | Temp 98.7°F | Resp 14 | Ht 68.0 in | Wt 214.0 lb

## 2017-09-07 DIAGNOSIS — I10 Essential (primary) hypertension: Secondary | ICD-10-CM

## 2017-09-07 DIAGNOSIS — E119 Type 2 diabetes mellitus without complications: Secondary | ICD-10-CM

## 2017-09-07 DIAGNOSIS — E785 Hyperlipidemia, unspecified: Secondary | ICD-10-CM

## 2017-09-07 LAB — EXTRA LAV TOP TUBE

## 2017-09-07 LAB — LIPID PANEL
Cholesterol: 218 mg/dL — ABNORMAL HIGH (ref <200)
HDL: 50 mg/dL — AB (ref >50)
LDL CHOLESTEROL (CALC): 137 mg/dL — AB
Non-HDL Cholesterol (Calc): 168 mg/dL (calc) — ABNORMAL HIGH (ref <130)
TRIGLYCERIDES: 173 mg/dL — AB (ref <150)
Total CHOL/HDL Ratio: 4.4 (calc) (ref <5.0)

## 2017-09-07 LAB — BASIC METABOLIC PANEL
BUN: 14 mg/dL (ref 7–25)
CALCIUM: 9.3 mg/dL (ref 8.6–10.4)
CHLORIDE: 107 mmol/L (ref 98–110)
CO2: 25 mmol/L (ref 20–32)
Creat: 0.94 mg/dL (ref 0.50–1.05)
GLUCOSE: 186 mg/dL — AB (ref 65–99)
Potassium: 3.8 mmol/L (ref 3.5–5.3)
SODIUM: 141 mmol/L (ref 135–146)

## 2017-09-07 LAB — POCT GLYCOSYLATED HEMOGLOBIN (HGB A1C): HEMOGLOBIN A1C: 6.4

## 2017-09-07 MED ORDER — CLONIDINE HCL 0.1 MG PO TABS
0.1000 mg | ORAL_TABLET | Freq: Once | ORAL | Status: AC
  Administered 2017-09-07: 0.1 mg via ORAL

## 2017-09-07 MED ORDER — SITAGLIPTIN PHOSPHATE 50 MG PO TABS: 50.0000 mg | ORAL_TABLET | Freq: Every day | ORAL | 3 refills | Status: DC

## 2017-09-07 NOTE — Patient Instructions (Signed)
Pressure is very elevated today. Consistently take her medication as prescribed. Go at least once weekly to retailer and check your blood pressure [facility] or [facility]. Return in 2 weeks for blood pressure check in the afternoon and ensure that she is taking her medication prior to visit. I'll see you back in 3 months for diabetes and hypertension follow-up. Sooner if needed

## 2017-09-07 NOTE — Progress Notes (Signed)
Patient ID: Kimberly Herman, female    DOB: Jan 08, 1966, 51 y.o.   MRN: [id number]  PCP: Scot Jun, FNP  Chief Complaint  Patient presents with  . Follow-up    3 MONTH    Subjective:  Kimberly Herman is a 51 y.o. female presents for Diabetes and hypertension follow-up. Kimberly Herman's last A1c was 5.7 and she was discontinued on her Lantus. She has struggled to control her blood pressure and presents today hypertensive with a blood pressure of 160/88. She is just getting off work and hasn't taken any medication since yesterday morning. She has gained weight since her last follow-up. Current Body mass index is 32.54 kg/m. She routinely checks her blood sugar and reports average readings of highest 214 and lowest 120's. She gave herself insulin last week as her fasting blood sugar 214. She admits to recently eating without regard to carbohydrates or sugar content. She denies shortness of breath or chest pain. She endorses headaches and feels this is likely related to her blood pressure. She recently had an eye exam but reports problems seeing out of her new glasses and will follow-up with ophthalmology regarding this issue.  Social History   Social History  . Marital status: Single    Spouse name: N/A  . Number of children: N/A  . Years of education: N/A   Occupational History  . Not on file.   Social History Main Topics  . Smoking status: Never Smoker  . Smokeless tobacco: Never Used  . Alcohol use Yes  . Drug use: No  . Sexual activity: Not on file   Other Topics Concern  . Not on file   Social History Narrative   Patient recently lost youngest son he was killed in a motor cycle accident at age 52. June 2018    Family History  Problem Relation Age of Onset  . Family history unknown: Yes    Review of Systems  See history of present illness  Patient Active Problem List   Diagnosis Date Noted  . T2DM (type 2 diabetes mellitus) ([facility]) 03/07/2017  . Hyperglycemia  03/07/2017  . HTN (hypertension) 03/07/2017  . ANEMIA, IRON DEFICIENCY, UNSPEC. 01/19/2007    No Known Allergies  Prior to Admission medications   Medication Sig Start Date End Date Taking? Authorizing Provider  amLODipine (NORVASC) 5 MG tablet Take 1.5 tablets (7.5 mg total) by mouth daily. 05/03/17  Yes Scot Jun, FNP  blood glucose meter kit and supplies KIT Dispense based on patient and insurance preference. Use up to four times daily as directed. (FOR ICD-9 250.00, 250.01). 03/04/17  Yes Scot Jun, FNP  Blood Pressure Monitoring (BLOOD PRESSURE CUFF) MISC Check blood pressure once daily at the same time and record readings 03/07/17  Yes Scot Jun, FNP  glucose blood (TRUE METRIX BLOOD GLUCOSE TEST) test strip TEST UP TO FOUR TIMES DAILY 05/11/17  Yes Scot Jun, FNP  hydrochlorothiazide (HYDRODIURIL) 25 MG tablet Take 1 tablet (25 mg total) by mouth daily. 03/14/17  Yes Scot Jun, FNP  losartan (COZAAR) 25 MG tablet Take 1 tablet (25 mg total) by mouth daily. 06/11/17  Yes Scot Jun, FNP  metFORMIN (GLUCOPHAGE) 500 MG tablet Take 1 tablet (500 mg total) by mouth 3 (three) times daily with meals. 03/28/17  Yes Scot Jun, FNP  ONGLYZA 5 MG TABS tablet TAKE 1 TABLET BY MOUTH DAILY. 08/25/17  Yes Scot Jun, FNP  simvastatin (ZOCOR) 20 MG tablet  Take 1 tablet (20 mg total) by mouth daily. 03/06/17  Yes Scot Jun, FNP  ONGLYZA 5 MG TABS tablet TAKE 1 TABLET BY MOUTH DAILY. Patient not taking: Reported on 09/07/2017 05/03/17   Scot Jun, FNP  saxagliptin HCl (ONGLYZA) 5 MG TABS tablet Take 1 tablet (5 mg total) by mouth daily. Patient not taking: Reported on 09/07/2017 05/31/17   Tresa Garter, MD    Past Medical, Surgical Family and Social History reviewed and updated.    Objective:   Today's Vitals   09/07/17 0853  BP: (!) 160/88  Pulse: 70  Resp: 14  Temp: 98.7 F (37.1 C)  TempSrc: Oral  SpO2:  99%  Weight: 214 lb (97.1 kg)  Height: '5\' 8"'  (1.727 m)    Wt Readings from Last 3 Encounters:  09/07/17 214 lb (97.1 kg)  06/07/17 204 lb 9.6 oz (92.8 kg)  03/28/17 203 lb (92.1 kg)    Physical Exam  Constitutional: She is oriented to person, place, and time. She appears well-developed and well-nourished.  HENT:  Head: Normocephalic and atraumatic.  Eyes: Pupils are equal, round, and reactive to light. Conjunctivae and EOM are normal.  Neck: Normal range of motion. Neck supple.  Cardiovascular: Normal rate, regular rhythm, normal heart sounds and intact distal pulses.   Pulmonary/Chest: Effort normal and breath sounds normal.  Musculoskeletal: Normal range of motion.  Lymphadenopathy:    She has no cervical adenopathy.  Neurological: She is alert and oriented to person, place, and time.  Skin: Skin is warm and dry.  Psychiatric: She has a normal mood and affect. Her behavior is normal. Judgment and thought content normal.   Assessment & Plan:  1. Type 2 diabetes mellitus without complication, without long-term current use of insulin (HCC) - POCT glycosylated hemoglobin (Hb A1C), 6.4, controlled although elevated from previously - Basic metabolic panel -Start Januvia 50 mg once daily and continue metformin 1500 mg daily -Discontinue Onglyza  2. Hyperlipidemia, unspecified hyperlipidemia type - Lipid panel, Continue simvastatin  3. Essential hypertension, controlled  - cloNIDine (CATAPRES) tablet 0.1 mg; Take 1 tablet (0.1 mg total) by mouth once. -Continue losartan, hydrochlorothiazide, amlodipine -Return in 2 weeks for complete blood pressure check ensure that she is taking her medications prior to the visit.  Return in 2 weeks for blood pressure check in 3 months for hypertension diabetes management.    Carroll Sage. [name], MSN, FNP-C [facility]  [address] [name] [place], [place] [address] [phone]

## 2017-09-09 ENCOUNTER — Other Ambulatory Visit: Payer: Self-pay | Admitting: Pharmacist

## 2017-09-12 ENCOUNTER — Telehealth: Payer: Self-pay | Admitting: Family Medicine

## 2017-09-12 MED ORDER — SIMVASTATIN 40 MG PO TABS: 40.0000 mg | ORAL_TABLET | Freq: Every day | ORAL | 3 refills | Status: DC

## 2017-09-12 NOTE — Addendum Note (Signed)
Addended by: Scot Jun on: 09/12/2017 08:42 PM   Modules accepted: Orders

## 2017-09-12 NOTE — Telephone Encounter (Signed)
Advise patient that her cholesterol panel was abnormally elevated typically her total cholesterol, LDL ( bad cholesterol) , triglycerides are elevated.  Increased her simvastatin 40 mg daily with dinner.  I will recheck her cholesterol panel at her 18-month follow-up she should come to that appointment fasting.   Kimberly Herman. [name], MSN, FNP-C [facility]  [name] [name], [place] [address] [phone]

## 2017-09-13 MED FILL — SIMVASTATIN 40 MG TABLET: 40 | 30 days supply | Qty: 30 | Fill #0

## 2017-09-13 NOTE — Telephone Encounter (Signed)
Patient notified of results and will pick up medication

## 2017-09-14 MED FILL — JANUVIA 50 MG TABLET: 50 | 30 days supply | Qty: 30 | Fill #0

## 2017-09-14 MED FILL — AMLODIPINE BESYLATE 5 MG TA: 5 | 30 days supply | Qty: 30 | Fill #4

## 2017-09-14 MED FILL — HYDROCHLOROTHIAZIDE 25 MG T: 25 | 30 days supply | Qty: 30 | Fill #3

## 2017-09-14 MED FILL — LOSARTAN POTASSIUM 25 MG TA: 25 | 30 days supply | Qty: 30 | Fill #0

## 2017-09-21 ENCOUNTER — Ambulatory Visit: Payer: Self-pay | Admitting: Family Medicine

## 2017-09-21 VITALS — BP 140/80

## 2017-09-21 DIAGNOSIS — Z0130 Encounter for examination of blood pressure without abnormal findings: Secondary | ICD-10-CM

## 2017-10-24 MED FILL — HYDROCHLOROTHIAZIDE 25 MG T: 25 | 30 days supply | Qty: 30 | Fill #4

## 2017-10-24 MED FILL — AMLODIPINE BESYLATE 5 MG TA: 5 | 30 days supply | Qty: 30 | Fill #5

## 2017-10-24 MED FILL — metFORMIN HCL 500 MG TABS: 500 | 30 days supply | Qty: 90 | Fill #2

## 2017-12-01 MED FILL — LOSARTAN POTASSIUM 25 MG TA: 25 | 30 days supply | Qty: 30 | Fill #1

## 2017-12-01 MED FILL — ?AMLODIPINE BESYLATE 5 MG T: 5 MG | 30 days supply | Qty: 30 | Fill #6

## 2017-12-01 MED FILL — HYDROCHLOROTHIAZIDE 25 MG T: 25 | 30 days supply | Qty: 30 | Fill #5

## 2017-12-09 ENCOUNTER — Encounter: Payer: Self-pay | Admitting: Family Medicine

## 2017-12-09 ENCOUNTER — Ambulatory Visit (INDEPENDENT_AMBULATORY_CARE_PROVIDER_SITE_OTHER): Payer: Self-pay | Admitting: Family Medicine

## 2017-12-09 VITALS — BP 144/66 | HR 88 | Temp 98.4°F | Resp 16 | Ht 68.0 in | Wt 211.0 lb

## 2017-12-09 DIAGNOSIS — R0609 Other forms of dyspnea: Secondary | ICD-10-CM

## 2017-12-09 DIAGNOSIS — I10 Essential (primary) hypertension: Secondary | ICD-10-CM

## 2017-12-09 DIAGNOSIS — E782 Mixed hyperlipidemia: Secondary | ICD-10-CM

## 2017-12-09 DIAGNOSIS — R82998 Other abnormal findings in urine: Secondary | ICD-10-CM

## 2017-12-09 DIAGNOSIS — E119 Type 2 diabetes mellitus without complications: Secondary | ICD-10-CM

## 2017-12-09 DIAGNOSIS — Z794 Long term (current) use of insulin: Secondary | ICD-10-CM

## 2017-12-09 LAB — POCT URINALYSIS DIP (DEVICE)
BILIRUBIN URINE: NEGATIVE
GLUCOSE, UA: NEGATIVE mg/dL
Ketones, ur: NEGATIVE mg/dL
NITRITE: NEGATIVE
Protein, ur: NEGATIVE mg/dL
Specific Gravity, Urine: 1.015 (ref 1.005–1.030)
UROBILINOGEN UA: 0.2 mg/dL (ref 0.0–1.0)
pH: 6.5 (ref 5.0–8.0)

## 2017-12-09 LAB — POCT GLYCOSYLATED HEMOGLOBIN (HGB A1C): Hemoglobin A1C: 6.7

## 2017-12-09 NOTE — Progress Notes (Signed)
Patient ID: Kimberly Herman, female    DOB: July 23, 1966, 52 y.o.   MRN: [id number]  PCP: Scot Jun, FNP  Chief Complaint  Patient presents with  . Follow-up    3 month on diabetes of HTN    Subjective:  HPI Kimberly Herman is a 52 y.o. female with diabetes, hypertension, and hypercholesterolemia, presents for a routine three month follow-up of chronic conditions and a new complaint of persistent shortness of breath.  Kelse reports that over the course of last few months, experiencing shortness of breath. At times, breathlessness, "feels as though an bus is sitting on chest".  To relieve symptoms, she lays down and rest. The shortness of breath is occurring at rest and with exertion. No history of asthma, COPD, or prior hx of smoking. She has a long history of untreated accelerated hypertension, which has been difficult to manage with current antihypertensive therapy. She reports associated chronic lower extremity swelling which has mildly improved with diuretic therapy. No prior history of CHF or echocardiogram on file.  Episodes are not occurring daily, however frequently enough to cause concern. She is not consistently checking her blood pressure at home, and is uncertain of recent readings. Denies any associated chest pain or cough. She has no significant family history of heart disease. Mother passed away of lung disease.   Yerlin has maintained good control of diabetes during last office visit. Last A1C office in 6.4. She is not checking her blood sugar routinely, however checked it once over the holiday and had a reading greater than 200 in which she administered insulin to correct reading. Reports adherence to antidiabetes medications. Denies polyuria, polydipsia, or polyphagia. Social History   Socioeconomic History  . Marital status: Single    Spouse name: Not on file  . Number of children: Not on file  . Years of education: Not on file  . Highest education level: Not on file   Social Needs  . Financial resource strain: Not on file  . Food insecurity - worry: Not on file  . Food insecurity - inability: Not on file  . Transportation needs - medical: Not on file  . Transportation needs - non-medical: Not on file  Occupational History  . Not on file  Tobacco Use  . Smoking status: Never Smoker  . Smokeless tobacco: Never Used  Substance and Sexual Activity  . Alcohol use: Yes  . Drug use: No  . Sexual activity: Not on file  Other Topics Concern  . Not on file  Social History Narrative   Patient recently lost youngest son he was killed in a motor cycle accident at age 15. June 2018   Family History  Mother passed away of lung disease.  Father died in 45's of old age.   Review of Systems  Constitutional: Negative.   HENT: Negative.   Respiratory: Positive for shortness of breath. Negative for cough.   Cardiovascular: Positive for leg swelling.  Endocrine: Negative.   Genitourinary: Negative.   Musculoskeletal: Negative.   Skin: Negative.   Neurological: Negative for dizziness.  Psychiatric/Behavioral: Negative.     Patient Active Problem List   Diagnosis Date Noted  . T2DM (type 2 diabetes mellitus) ([facility]) 03/07/2017  . Hyperglycemia 03/07/2017  . HTN (hypertension) 03/07/2017  . ANEMIA, IRON DEFICIENCY, UNSPEC. 01/19/2007    No Known Allergies  Prior to Admission medications   Medication Sig Start Date End Date Taking? Authorizing Provider  amLODipine (NORVASC) 5 MG tablet Take 1.5 tablets (7.5  mg total) by mouth daily. 05/03/17  Yes Scot Jun, FNP  blood glucose meter kit and supplies KIT Dispense based on patient and insurance preference. Use up to four times daily as directed. (FOR ICD-9 250.00, 250.01). 03/04/17  Yes Scot Jun, FNP  Blood Pressure Monitoring (BLOOD PRESSURE CUFF) MISC Check blood pressure once daily at the same time and record readings 03/07/17  Yes Scot Jun, FNP  glucose blood (TRUE METRIX BLOOD  GLUCOSE TEST) test strip TEST UP TO FOUR TIMES DAILY 05/11/17  Yes Scot Jun, FNP  hydrochlorothiazide (HYDRODIURIL) 25 MG tablet Take 1 tablet (25 mg total) by mouth daily. 03/14/17  Yes Scot Jun, FNP  losartan (COZAAR) 25 MG tablet Take 1 tablet (25 mg total) by mouth daily. 06/11/17  Yes Scot Jun, FNP  metFORMIN (GLUCOPHAGE) 500 MG tablet Take 1 tablet (500 mg total) by mouth 3 (three) times daily with meals. 03/28/17  Yes Scot Jun, FNP  simvastatin (ZOCOR) 40 MG tablet Take 1 tablet (40 mg total) by mouth daily. 09/12/17  Yes Scot Jun, FNP  sitaGLIPtin (JANUVIA) 50 MG tablet Take 1 tablet (50 mg total) by mouth daily. 09/07/17  Yes Scot Jun, FNP    Past Medical, Surgical Family and Social History reviewed and updated.    Objective:   Today's Vitals   12/09/17 1445  BP: (!) 144/66  Pulse: 88  Resp: 16  Temp: 98.4 F (36.9 C)  TempSrc: Oral  SpO2: 99%  Weight: 211 lb (95.7 kg)  Height: '5\' 8"'$  (1.727 m)    Wt Readings from Last 3 Encounters:  12/09/17 211 lb (95.7 kg)  09/07/17 214 lb (97.1 kg)  06/07/17 204 lb 9.6 oz (92.8 kg)    Physical Exam  Constitutional: She is oriented to person, place, and time. She appears well-developed and well-nourished.  HENT:  Head: Normocephalic and atraumatic.  Nose: Nose normal.  Mouth/Throat: Oropharynx is clear and moist.  Eyes: Conjunctivae and EOM are normal. Pupils are equal, round, and reactive to light.  Neck: Normal range of motion. Neck supple. No thyromegaly present.  Cardiovascular: Normal rate, regular rhythm, normal heart sounds and intact distal pulses.  Pulmonary/Chest: Effort normal and breath sounds normal.  Abdominal: Soft. Bowel sounds are normal.  Musculoskeletal: Normal range of motion.  Lymphadenopathy:    She has no cervical adenopathy.  Neurological: She is alert and oriented to person, place, and time.  Skin: Skin is warm and dry.  Psychiatric: She has a  normal mood and affect. Her behavior is normal. Judgment and thought content normal.   Assessment & Plan:  1. Type 2 diabetes mellitus without complication, with long-term current use of insulin ([facility]), A1C-6.7.  Increased slightly from prior reading of 6.4. Continue Metformin and Januvia. Encouraged exercise with a goal of 150 minutes per week.    2. Other form of dyspnea, new problem. Concern for cardiac involvement as patient has a long history of uncontrolled hypertension. No prior echo or cardiology work-up. Referring to Cardiology.  3. Mixed hyperlipidemia, return for fasting lipid panel. The 10-year ASCVD risk score Mikey Bussing DC Jr., et al., 2013) is: 17.9%   Values used to calculate the score:     Age: 21 years     Sex: Female     Is Non-Hispanic African American: Yes     Diabetic: Yes     Tobacco smoker: No     Systolic Blood Pressure: 973 mmHg     Is  BP treated: Yes     HDL Cholesterol: 50 mg/dL     Total Cholesterol: 218 mg/dL - Lipid panel; Future  4. Urine leukocytes, urine culture pending.   5. Essential hypertension, elevated slightly today. Watch and monitor BP for now. We have discussed target BP range and blood pressure goal. I have advised patient to check BP regularly and to call us back or report to clinic if the numbers are consistently higher than 140/90. We discussed the importance of compliance with medical therapy and DASH diet recommended, consequences of uncontrolled hypertension discussed.    Orders Placed This Encounter  Procedures  . Urine Culture  . Lipid panel  . Ambulatory referral to Cardiology  . POCT urinalysis dip (device)  . POCT glycosylated hemoglobin (Hb A1C)    RTC: 3 months for chronic condition management.     Carroll Sage. [name], MSN, FNP-C [facility]  [name] [place], [place] [address] [phone]

## 2017-12-11 LAB — URINE CULTURE

## 2017-12-12 ENCOUNTER — Other Ambulatory Visit (INDEPENDENT_AMBULATORY_CARE_PROVIDER_SITE_OTHER): Payer: Self-pay

## 2017-12-12 DIAGNOSIS — E782 Mixed hyperlipidemia: Secondary | ICD-10-CM

## 2017-12-13 LAB — LIPID PANEL
CHOLESTEROL TOTAL: 206 mg/dL — AB (ref 100–199)
Chol/HDL Ratio: 4.1 ratio (ref 0.0–4.4)
HDL: 50 mg/dL (ref >39)
LDL CALC: 122 mg/dL — AB (ref 0–99)
TRIGLYCERIDES: 170 mg/dL — AB (ref 0–149)
VLDL CHOLESTEROL CAL: 34 mg/dL (ref 5–40)

## 2017-12-14 MED ORDER — ATORVASTATIN CALCIUM 40 MG PO TABS: 40.0000 mg | ORAL_TABLET | Freq: Every day | ORAL | 3 refills | Status: DC

## 2017-12-14 MED FILL — ?ATORVASTATIN 40MG TABLET: 40 | 30 days supply | Qty: 30 | Fill #0

## 2017-12-14 NOTE — Progress Notes (Signed)
Discontinued simvastatin and start atorvastatin 40 mg once daily at 6:00 pm due to abnormal lipid panel

## 2017-12-27 ENCOUNTER — Encounter: Payer: Self-pay | Admitting: Cardiovascular Disease

## 2017-12-27 ENCOUNTER — Ambulatory Visit (INDEPENDENT_AMBULATORY_CARE_PROVIDER_SITE_OTHER): Payer: Self-pay | Admitting: Cardiovascular Disease

## 2017-12-27 VITALS — BP 160/96 | HR 70 | Ht 68.0 in | Wt 211.0 lb

## 2017-12-27 DIAGNOSIS — E7800 Pure hypercholesterolemia, unspecified: Secondary | ICD-10-CM

## 2017-12-27 DIAGNOSIS — R0602 Shortness of breath: Secondary | ICD-10-CM

## 2017-12-27 DIAGNOSIS — E785 Hyperlipidemia, unspecified: Secondary | ICD-10-CM | POA: Insufficient documentation

## 2017-12-27 MED ORDER — LOSARTAN POTASSIUM 50 MG PO TABS: 50.0000 mg | ORAL_TABLET | Freq: Every day | ORAL | 6 refills | Status: DC

## 2017-12-27 NOTE — Assessment & Plan Note (Signed)
Kimberly Herman was referred to me by Molli Barrows FNP  for new onset episodic shortness of breath. Risk factors include treated diabetes, hypertension and hyperlipidemia. She's never had a heart attack or stroke. She denies chest pain. She's had 3-4 episodes of shortness of breath which occur spontaneously lasting minutes that time weaning her week and lethargic after that. It's really possible this is an anginal equivalent versus a tachyarrhythmia. I'm going to get a 2-D echocardiogram, exercise Myoview stress test and a 30 day event monitor to further evaluate.

## 2017-12-27 NOTE — Assessment & Plan Note (Signed)
History of essential hypertension with blood pressure measured at 130/96. She is on amlodipine, hydro-diarrheal and low-dose losartan. Endocrine increase her losartan from 25-50 mg a day. She'll keep a 30 day blood pressure log and I will have her see Erasmo Downer back in one month to review and titrate her medications.

## 2017-12-27 NOTE — Patient Instructions (Signed)
Medication Instructions: Your physician recommends that you continue on your current medications as directed. Please refer to the Current Medication list given to you today.  Increase losartan to 50 mg daily.  Testing/Procedures: Your physician has recommended that you wear a 30 day event monitor. Event monitors are medical devices that record the heart's electrical activity. Doctors most often [place] these monitors to diagnose arrhythmias. Arrhythmias are problems with the speed or rhythm of the heartbeat. The monitor is a small, portable device. You can wear one while you do your normal daily activities. This is usually used to diagnose what is causing palpitations/syncope (passing out).  Your physician has requested that you have an exercise stress myoview. For further information please visit [email or web address]. Please follow instruction sheet, as given.  Your physician has requested that you have an echocardiogram. Echocardiography is a painless test that uses sound waves to create images of your heart. It provides your doctor with information about the size and shape of your heart and how well your heart's chambers and valves are working. This procedure takes approximately one hour. There are no restrictions for this procedure.  Follow-Up: Your physician recommends that you schedule a follow-up appointment in: 1 month with PharmD in [facility]. Your physician has requested that you regularly monitor and record your blood pressure readings at home. Please use the same machine at the same time of day to check your readings and record them to bring to your follow-up visit.  Your physician recommends that you schedule a follow-up appointment with Dr. Gwenlyn Found after testing.  If you need a refill on your cardiac medications before your next appointment, please call your pharmacy.

## 2017-12-27 NOTE — Progress Notes (Signed)
12/27/2017 Kimberly Herman   March 15, 1966  [id number]  Primary Physician Kimberly Jun, FNP Primary Cardiologist: Kimberly Harp MD Kimberly Herman, [place]  HPI:  Kimberly Herman is a 52 y.o. severely overweight married African-American female mother of 2 living children (63 year old son recently passed away in a motorcycle accident), grandmother to 42 grandchildren he works doing "in-home health". She is a [employer]. She was referred by Kimberly Barrows FNP for cardiovascular evaluation because of new-onset shortness of breath. Risk factors include treated hypertension, diabetes and hyperlipidemia. She has never had a heart attack or stroke. She denies chest pain but has had 3-4 episodes of shortness of breath over the last month lasting minutes that time weaning her feeling weak and washed out afterwards.    Current Meds  Medication Sig  . amLODipine (NORVASC) 5 MG tablet Take 1.5 tablets (7.5 mg total) by mouth daily.  [name] atorvastatin (LIPITOR) 40 MG tablet Take 1 tablet (40 mg total) by mouth daily.  . blood glucose meter kit and supplies KIT Dispense based on patient and insurance preference. Use up to four times daily as directed. (FOR ICD-9 250.00, 250.01).  . Blood Pressure Monitoring (BLOOD PRESSURE CUFF) MISC Check blood pressure once daily at the same time and record readings  . glucose blood (TRUE METRIX BLOOD GLUCOSE TEST) test strip TEST UP TO FOUR TIMES DAILY  . hydrochlorothiazide (HYDRODIURIL) 25 MG tablet Take 1 tablet (25 mg total) by mouth daily.  [name] losartan (COZAAR) 25 MG tablet Take 1 tablet (25 mg total) by mouth daily.  . metFORMIN (GLUCOPHAGE) 500 MG tablet Take 1 tablet (500 mg total) by mouth 3 (three) times daily with meals.  . sitaGLIPtin (JANUVIA) 50 MG tablet Take 1 tablet (50 mg total) by mouth daily.     No Known Allergies  Social History   Socioeconomic History  . Marital status: Single    Spouse name: Not on file  . Number of children: Not on file  .  Years of education: Not on file  . Highest education level: Not on file  Social Needs  . Financial resource strain: Not on file  . Food insecurity - worry: Not on file  . Food insecurity - inability: Not on file  . Transportation needs - medical: Not on file  . Transportation needs - non-medical: Not on file  Occupational History  . Not on file  Tobacco Use  . Smoking status: Never Smoker  . Smokeless tobacco: Never Used  Substance and Sexual Activity  . Alcohol use: Yes  . Drug use: No  . Sexual activity: Not on file  Other Topics Concern  . Not on file  Social History Narrative   Patient recently lost youngest son he was killed in a motor cycle accident at age 67. Kimberly Herman 2018     Review of Systems: General: negative for chills, fever, night sweats or weight changes.  Cardiovascular: negative for chest pain, dyspnea on exertion, edema, orthopnea, palpitations, paroxysmal nocturnal dyspnea or shortness of breath Dermatological: negative for rash Respiratory: negative for cough or wheezing Urologic: negative for hematuria Abdominal: negative for nausea, vomiting, diarrhea, bright red blood per rectum, melena, or hematemesis Neurologic: negative for visual changes, syncope, or dizziness All other systems reviewed and are otherwise negative except as noted above.    Blood pressure (!) 160/96, pulse 70, height _0  (1.727 m), weight 211 lb (95.7 kg).  General appearance: alert and no distress Neck: no adenopathy, no carotid  bruit, no JVD, supple, symmetrical, trachea midline and thyroid not enlarged, symmetric, no tenderness/mass/nodules Lungs: clear to auscultation bilaterally Heart: regular rate and rhythm, S1, S2 normal, no murmur, click, rub or gallop Extremities: extremities normal, atraumatic, no cyanosis or edema Pulses: 2+ and symmetric Skin: Skin color, texture, turgor normal. No rashes or lesions Neurologic: Alert and oriented X 3, normal strength and tone. Normal  symmetric reflexes. Normal coordination and gait  EKG sinus rhythm at 70 without ST or T-wave changes. I Personally reviewed this EKG.  ASSESSMENT AND PLAN:   Shortness of breath Ms. Kimberly Herman was referred to me by Kimberly Barrows FNP  for new onset episodic shortness of breath. Risk factors include treated diabetes, hypertension and hyperlipidemia. She's never had a heart attack or stroke. She denies chest pain. She's had 3-4 episodes of shortness of breath which occur spontaneously lasting minutes that time weaning her week and lethargic after that. It's really possible this is an anginal equivalent versus a tachyarrhythmia. I'm going to get a 2-D echocardiogram, exercise Myoview stress test and a 30 day event monitor to further evaluate.  Hyperlipidemia History of hyperlipidemia on statin therapy followed by her PCP  HTN (hypertension) History of essential hypertension with blood pressure measured at 130/96. She is on amlodipine, hydro-diarrheal and low-dose losartan. Endocrine increase her losartan from 25-50 mg a day. She'll keep a 30 day blood pressure log and I will have her see Kimberly Herman back in one month to review and titrate her medications.      Kimberly Harp MD FACP,FACC,FAHA, [facility] 12/27/2017 9:44 AM

## 2017-12-27 NOTE — Assessment & Plan Note (Signed)
History of hyperlipidemia on statin therapy followed by her PCP. 

## 2017-12-28 ENCOUNTER — Telehealth (HOSPITAL_COMMUNITY): Payer: Self-pay

## 2017-12-28 NOTE — Telephone Encounter (Signed)
Encounter complete. 

## 2017-12-30 ENCOUNTER — Ambulatory Visit (HOSPITAL_COMMUNITY)
Admission: RE | Admit: 2017-12-30 | Discharge: 2017-12-30 | Disposition: A | Discharge status: Home / Self Care | Payer: Self-pay | Source: Ambulatory Visit | Attending: Cardiology | Admitting: Cardiology

## 2017-12-30 DIAGNOSIS — R0602 Shortness of breath: Secondary | ICD-10-CM | POA: Insufficient documentation

## 2017-12-30 LAB — MYOCARDIAL PERFUSION IMAGING
CHL CUP MPHR: 167 {beats}/min
CHL CUP NUCLEAR SDS: 0
CHL CUP NUCLEAR SSS: 3
CHL CUP RESTING HR STRESS: 65 {beats}/min
CSEPED: 8 min
CSEPEDS: 8 s
CSEPEW: 10.1 METS
CSEPHR: 91 %
LV dias vol: 77 mL (ref 46–106)
LV sys vol: 30 mL
Peak HR: 153 {beats}/min
RPE: 18
SRS: 3
TID: 0.91

## 2017-12-30 MED ORDER — TECHNETIUM TC 99M TETROFOSMIN IV KIT
10.2000 | PACK | Freq: Once | INTRAVENOUS | Status: AC | PRN
  Administered 2017-12-30: 10.2 via INTRAVENOUS
  Filled 2017-12-30: qty 11

## 2017-12-30 MED ORDER — TECHNETIUM TC 99M TETROFOSMIN IV KIT
31.7000 | PACK | Freq: Once | INTRAVENOUS | Status: AC | PRN
Start: 2017-12-30 — End: 2017-12-30
  Administered 2017-12-30: 31.7 via INTRAVENOUS
  Filled 2017-12-30: qty 32

## 2018-01-03 ENCOUNTER — Other Ambulatory Visit: Payer: Self-pay | Admitting: Cardiovascular Disease

## 2018-01-03 DIAGNOSIS — R000 Tachycardia, unspecified: Secondary | ICD-10-CM

## 2018-01-03 DIAGNOSIS — R5383 Other fatigue: Secondary | ICD-10-CM

## 2018-01-03 DIAGNOSIS — R0602 Shortness of breath: Secondary | ICD-10-CM

## 2018-01-03 DIAGNOSIS — R531 Weakness: Secondary | ICD-10-CM

## 2018-01-04 ENCOUNTER — Ambulatory Visit (HOSPITAL_COMMUNITY): Payer: Self-pay | Attending: Cardiovascular Disease

## 2018-01-04 ENCOUNTER — Other Ambulatory Visit: Payer: Self-pay

## 2018-01-04 ENCOUNTER — Ambulatory Visit: Payer: Self-pay

## 2018-01-04 ENCOUNTER — Encounter: Payer: Self-pay | Admitting: *Deleted

## 2018-01-04 DIAGNOSIS — I071 Rheumatic tricuspid insufficiency: Secondary | ICD-10-CM | POA: Insufficient documentation

## 2018-01-04 DIAGNOSIS — E119 Type 2 diabetes mellitus without complications: Secondary | ICD-10-CM | POA: Insufficient documentation

## 2018-01-04 DIAGNOSIS — I119 Hypertensive heart disease without heart failure: Secondary | ICD-10-CM | POA: Insufficient documentation

## 2018-01-04 DIAGNOSIS — E785 Hyperlipidemia, unspecified: Secondary | ICD-10-CM | POA: Insufficient documentation

## 2018-01-04 DIAGNOSIS — R0602 Shortness of breath: Secondary | ICD-10-CM

## 2018-01-04 MED FILL — metFORMIN HCL 500 MG TABS: 500 | 30 days supply | Qty: 90 | Fill #3

## 2018-01-04 MED FILL — ?AMLODIPINE BESYLATE 5 MG T: 5 MG | 30 days supply | Qty: 30 | Fill #7

## 2018-01-04 MED FILL — HYDROCHLOROTHIAZIDE 25 MG T: 25 | 30 days supply | Qty: 30 | Fill #6

## 2018-01-04 MED FILL — LOSARTAN POTASSIUM 50 MG TA: 50 | 30 days supply | Qty: 30 | Fill #0

## 2018-01-04 MED FILL — !JANUVIA 50 MG TABLET: 50 | 30 days supply | Qty: 30 | Fill #1

## 2018-01-04 NOTE — Progress Notes (Signed)
Patient ID: Kimberly Herman, female   DOB: 01/08/1966, 52 y.o.   MRN: [id number]  Patient given [employer]/ [employer] hardship application.  She will be returning the application and supporting documents to Mack Guise on 01/05/18, at the [employer].  Once her application has been received, she will be enrolled for [employer] to mail a cardiac event monitor upon acceptance into their hardship program.

## 2018-01-10 ENCOUNTER — Ambulatory Visit (INDEPENDENT_AMBULATORY_CARE_PROVIDER_SITE_OTHER): Payer: Self-pay | Admitting: Pharmacist Clinician (PhC)/ Clinical Pharmacy Specialist

## 2018-01-10 DIAGNOSIS — I10 Essential (primary) hypertension: Secondary | ICD-10-CM

## 2018-01-10 MED ORDER — CHLORTHALIDONE 25 MG PO TABS: 25.0000 mg | ORAL_TABLET | Freq: Every day | ORAL | 6 refills | Status: DC

## 2018-01-10 MED ORDER — LOSARTAN POTASSIUM 100 MG PO TABS: 100.0000 mg | ORAL_TABLET | Freq: Every day | ORAL | 6 refills | Status: DC

## 2018-01-10 MED FILL — ?CHLORTHALIDONE 25 MG TABLE: 25 | 30 days supply | Qty: 30 | Fill #0

## 2018-01-10 NOTE — Assessment & Plan Note (Signed)
Patient with hypertension, not improved since the increase losartan to 50 mg daily.  Will have her increase the losartan to 100 mg and move dose to evenings.  She will also switch the hctz to chlorthalidone 25 mg daily.  Will have her return in a month for follow up.  Would like to get her off the amlodipine, but explained that we first would like to get her pressure at a more manageable level.  Patient understands and will return next month with a log of her blood pressure readings as well as her home cuff.

## 2018-01-10 NOTE — Patient Instructions (Signed)
Return for a a follow up appointment in 1 month  Your blood pressure today is 164/100   Check your blood pressure at home daily and keep record of the readings.  Take your BP meds as follows:  Switch hydrochlorothiazide to chlorthalidone 25 mg once daily  Increase  Losartan to 100 mg once daily and move to evenings  Continue with amlodipine for now  Bring all of your meds, your BP cuff and your record of home blood pressures to your next appointment.  Exercise as you're able, try to walk approximately 30 minutes per day.  Keep salt intake to a minimum, especially watch canned and prepared boxed foods.  Eat more fresh fruits and vegetables and fewer canned items.  Avoid eating in fast food restaurants.    HOW TO TAKE YOUR BLOOD PRESSURE: . Rest 5 minutes before taking your blood pressure. .  Don't smoke or drink caffeinated beverages for at least 30 minutes before. . Take your blood pressure before (not after) you eat. . Sit comfortably with your back supported and both feet on the floor (don't cross your legs). . Elevate your arm to heart level on a table or a desk. . Use the proper sized cuff. It should fit smoothly and snugly around your bare upper arm. There should be enough room to slip a fingertip under the cuff. The bottom edge of the cuff should be 1 inch above the crease of the elbow. . Ideally, take 3 measurements at one sitting and record the average.

## 2018-01-10 NOTE — Progress Notes (Signed)
01/10/2018 Beverly Gust Capri December 02, 1965 [id number]   HPI:  Kimberly Herman is a 52 y.o. female patient of Dr Gwenlyn Found, with a PMH below who presents today for hypertension clinic evaluation.  In addition to hypertension her medical history is significant for diabetes (A1c 6.7) and hyperlipidemia.  She was originally referred to Dr. Gwenlyn Found for shortness of breath.  An echocardiogram showed normal LV systolic function and grade 1 diastolic dysfunction and stress test was normal.  EF 55-65%.   At her last visit with Dr. Gwenlyn Found, BP was elevated at 160/96 and her losartan was increased from 25 to 50 mg daily (2 wks ago).  She returns today for follow up.  She is feeling well overall, but does note some swelling in her ankles with the amlodipine.  It is not uncomfortable or causing problems at this time.  Patient works 3rd shift and did not take her medications this morning before coming to the office.     Blood Pressure Goal:  130/80  Current Medications:  Amlodipine 7.5 mg qd - am, causing ankle swelling  hctz 25 mg qd - am  Losartan 50 mg qd - am  Family Hx:  Mother at 79 - lung cancer  Father 27 - old age  No known heart disease in her siblings or two living children   Social Hx:  No tobacco, social alcohol only; no caffeine  Diet:  Mix of home/out; trying to stay away from fast foods; prefers [employer] or sandwich shops; avoids pork; eats plenty of vegetables/fruits  Exercise:  No regular exercise; has started with occasional walks  Home BP readings:  Seem to be about the same 150-160's/90's; cuff is wrist cuff < 1 yr old; she did not bring it with her today   Intolerances:   nkda Labs:  08/2016:  Na 141, K 3.8, Glu186, Scr 0.94, BUN 14;  CrCl = 104.6  Wt Readings from Last 3 Encounters:  12/30/17 211 lb (95.7 kg)  12/27/17 211 lb (95.7 kg)  12/09/17 211 lb (95.7 kg)   BP Readings from Last 3 Encounters:  01/10/18 (!) 164/100  12/27/17 (!) 160/96  12/09/17 (!) 144/66   Pulse  Readings from Last 3 Encounters:  12/27/17 70  12/09/17 88  09/07/17 70    Current Outpatient Medications  Medication Sig Dispense Refill  . amLODipine (NORVASC) 5 MG tablet Take 1.5 tablets (7.5 mg total) by mouth daily. 90 tablet 1  . atorvastatin (LIPITOR) 40 MG tablet Take 1 tablet (40 mg total) by mouth daily. 90 tablet 3  . blood glucose meter kit and supplies KIT Dispense based on patient and insurance preference. Use up to four times daily as directed. (FOR ICD-9 250.00, 250.01). 1 each 0  . Blood Pressure Monitoring (BLOOD PRESSURE CUFF) MISC Check blood pressure once daily at the same time and record readings 1 each 0  . chlorthalidone (HYGROTON) 25 MG tablet Take 1 tablet (25 mg total) by mouth daily. 30 tablet 6  . glucose blood (TRUE METRIX BLOOD GLUCOSE TEST) test strip TEST UP TO FOUR TIMES DAILY 100 each 0  . hydrochlorothiazide (HYDRODIURIL) 25 MG tablet Take 1 tablet (25 mg total) by mouth daily. 90 tablet 3  . losartan (COZAAR) 100 MG tablet Take 1 tablet (100 mg total) by mouth daily. 30 tablet 6  . metFORMIN (GLUCOPHAGE) 500 MG tablet Take 1 tablet (500 mg total) by mouth 3 (three) times daily with meals. 90 tablet 3  . sitaGLIPtin (JANUVIA)  50 MG tablet Take 1 tablet (50 mg total) by mouth daily. 30 tablet 3   No current facility-administered medications for this visit.     No Known Allergies  Past Medical History:  Diagnosis Date  . Diabetes mellitus without complication ([place])   . Hypertension     Blood pressure (!) 164/100.  HTN (hypertension) Patient with hypertension, not improved since the increase losartan to 50 mg daily.  Will have her increase the losartan to 100 mg and move dose to evenings.  She will also switch the hctz to chlorthalidone 25 mg daily.  Will have her return in a month for follow up.  Would like to get her off the amlodipine, but explained that we first would like to get her pressure at a more manageable level.  Patient understands and  will return next month with a log of her blood pressure readings as well as her home cuff.     Tommy Medal PharmD CPP [facility] [address] [place], [place] [address] [phone]

## 2018-01-18 ENCOUNTER — Ambulatory Visit (INDEPENDENT_AMBULATORY_CARE_PROVIDER_SITE_OTHER): Payer: Self-pay | Admitting: Cardiovascular Disease

## 2018-01-18 ENCOUNTER — Encounter: Payer: Self-pay | Admitting: Cardiovascular Disease

## 2018-01-18 DIAGNOSIS — I10 Essential (primary) hypertension: Secondary | ICD-10-CM

## 2018-01-18 DIAGNOSIS — R0602 Shortness of breath: Secondary | ICD-10-CM

## 2018-01-18 NOTE — Patient Instructions (Signed)
Medication Instructions: Your physician recommends that you continue on your current medications as directed. Please refer to the Current Medication list given to you today.   Follow-Up: Your physician recommends that you schedule a follow-up appointment in: 1 month with PharmD. Your physician has requested that you regularly monitor and record your blood pressure readings at home. Please use the same machine at the same time of day to check your readings and record them to bring to your follow-up visit.  Your physician wants you to follow-up in: 6 months with Dr. Gwenlyn Found. You will receive a reminder letter in the mail two months in advance. If you don't receive a letter, please call our office to schedule the follow-up appointment.  If you need a refill on your cardiac medications before your next appointment, please call your pharmacy.

## 2018-01-18 NOTE — Progress Notes (Signed)
Kimberly Herman returns today for follow-up of her outpatient test. 2-D echo was essentially normal except for grade 1 diastolic dysfunction and mildly stress test was low risk. I've reassured her that it's unlikely that her dyspnea is cardiovascular in nature. Her shortness of breath is actually somewhat improved. Her blood pressure readings have tended to be higher at home. Told about the importance of salt avoidance. She'll see Erasmo Downer back in 1 month for review and medication titration. I will see her back in 6 months for follow-up.  Lorretta Harp, M.D., [place], [facility], [place] [address] [place], [place]  [address]  [phone] 01/18/2018 10:36 AM

## 2018-01-18 NOTE — Assessment & Plan Note (Signed)
Kimberly Herman returns today for follow-up of her outpatient tests. Her 2-D echo was essentially normal except for grade 1 diastolic dysfunction and her Myoview was low risk. Her blood pressures have trended higher than we talked about salt avoidance.

## 2018-01-18 NOTE — Assessment & Plan Note (Signed)
History of essential hypertension blood pressure measured at 115/91. I have reviewed her home blood pressure readings which have trended higher. She is on amlodipine, melena and losartan. Keep a blood pressure log over the next month. She'll see Erasmo Downer back to review. May need additional antihypertensive such as a beta blocker or hydralazine.

## 2018-02-07 ENCOUNTER — Ambulatory Visit: Payer: Self-pay

## 2018-02-16 ENCOUNTER — Ambulatory Visit (INDEPENDENT_AMBULATORY_CARE_PROVIDER_SITE_OTHER): Payer: Self-pay | Admitting: Pharmacist

## 2018-02-16 ENCOUNTER — Encounter: Payer: Self-pay | Admitting: Pharmacist

## 2018-02-16 VITALS — BP 128/82 | HR 74 | Ht 68.0 in | Wt 211.0 lb

## 2018-02-16 DIAGNOSIS — I10 Essential (primary) hypertension: Secondary | ICD-10-CM

## 2018-02-16 LAB — BASIC METABOLIC PANEL
BUN/Creatinine Ratio: 16 (ref 9–23)
BUN: 16 mg/dL (ref 6–24)
CHLORIDE: 98 mmol/L (ref 96–106)
CO2: 29 mmol/L (ref 20–29)
Calcium: 9.2 mg/dL (ref 8.7–10.2)
Creatinine, Ser: 1.02 mg/dL — ABNORMAL HIGH (ref 0.57–1.00)
GFR, EST AFRICAN AMERICAN: 73 mL/min/{1.73_m2} (ref >59)
GFR, EST NON AFRICAN AMERICAN: 63 mL/min/{1.73_m2} (ref >59)
Glucose: 285 mg/dL — ABNORMAL HIGH (ref 65–99)
POTASSIUM: 3.6 mmol/L (ref 3.5–5.2)
Sodium: 142 mmol/L (ref 134–144)

## 2018-02-16 MED ORDER — RANITIDINE HCL 150 MG PO CAPS: 150.0000 mg | ORAL_CAPSULE | Freq: Every evening | ORAL | 0 refills | Status: DC

## 2018-02-16 MED ORDER — LOSARTAN POTASSIUM 100 MG PO TABS: 100.0000 mg | ORAL_TABLET | Freq: Every day | ORAL | 3 refills | Status: DC

## 2018-02-16 MED FILL — raNITIdine HCL 150 MG TABS: 150 | 14 days supply | Qty: 14 | Fill #0

## 2018-02-16 MED FILL — LOSARTAN POTASSIUM 100 MG T: 100 | 30 days supply | Qty: 30 | Fill #0

## 2018-02-16 NOTE — Assessment & Plan Note (Signed)
Blood pressure well controlled today during office visit. Noted reported home BP reading remain elevated but patient was using inappropriate technique with wrist cuff. Long time was spent taking about lifestyle modifications to improve sodium intake and to control acid reflux. Will work on lifestyle modification, continue current medication without changes, monitor BP twice daily and bring records to follow up appoinment in 4 weeks.  Patient also instructed to take ranitidine 150mg  daily x 14 days to improve acid reflux and contact PCP if problems continues after 14 days.

## 2018-02-16 NOTE — Progress Notes (Signed)
HPI:  Kimberly Herman is a 52 y.o. female patient of Dr Gwenlyn Found, with a PMH below who presents today for hypertension clinic follow up.  In addition to hypertension her medical history is significant for diabetes (A1c 6.7) and hyperlipidemia.  She was originally referred to Dr. Gwenlyn Found for shortness of breath.  An echocardiogram showed normal LV systolic function and grade 1 diastolic dysfunction and stress test was normal.  EF 55-65%.   At her last HTN clinic visit , BP was elevated and her losartan was increased to 100 mg daily (5 wks ago).  She returns today for follow up.  She is feeling well overall, but does note swelling in her ankles is uchanged.  Denies dizziness, headaches, lightheadedness, increased fatigue or any other problems with current therapy. Her primary  Complain is significant reflux and cough.   Blood Pressure Goal:  130/80  Current Medications:  Amlodipine 7.5 mg daily in AM  Chlorthalidone 61m daily in AM  Losartan 1019mdaily in evening  Family History:  Mother at 776 lung cancer  Father 928 old age  No known heart disease in her siblings or two living children   Social History:  No tobacco, social alcohol only; occasional caffeine  Diet:  Mix of home/out; trying to stay away from fast foods; prefers [employer] or sandwich shops; avoids pork; eats plenty of vegetables/fruits  Exercise:  No regular exercise; has started with occasional walks  Home BP readings:  Wrist cuff < 1 yr old; calibrated in clinic today. Accurate within 10 mm/Hg when using appropriate technique.  9 home reading provided; average 143/95 (pulse 69 - 83 bpm)   Intolerances:   nkda Labs:  08/2017:  Na 141, K 3.8, Glu186, Scr 0.94, BUN 14;  CrCl = 104.6  Wt Readings from Last 3 Encounters:  02/16/18 211 lb (95.7 kg)  01/18/18 212 lb (96.2 kg)  12/30/17 211 lb (95.7 kg)   BP Readings from Last 3 Encounters:  02/16/18 128/82  01/18/18 (!) 158/91  01/10/18 (!) 164/100   Pulse  Readings from Last 3 Encounters:  02/16/18 74  01/18/18 71  12/27/17 70    Current Outpatient Medications  Medication Sig Dispense Refill  . amLODipine (NORVASC) 5 MG tablet Take 1.5 tablets (7.5 mg total) by mouth daily. 90 tablet 1  . atorvastatin (LIPITOR) 40 MG tablet Take 1 tablet (40 mg total) by mouth daily. 90 tablet 3  . blood glucose meter kit and supplies KIT Dispense based on patient and insurance preference. Use up to four times daily as directed. (FOR ICD-9 250.00, 250.01). 1 each 0  . Blood Pressure Monitoring (BLOOD PRESSURE CUFF) MISC Check blood pressure once daily at the same time and record readings 1 each 0  . chlorthalidone (HYGROTON) 25 MG tablet Take 1 tablet (25 mg total) by mouth daily. 30 tablet 6  . glucose blood (TRUE METRIX BLOOD GLUCOSE TEST) test strip TEST UP TO FOUR TIMES DAILY 100 each 0  . metFORMIN (GLUCOPHAGE) 500 MG tablet Take 1 tablet (500 mg total) by mouth 3 (three) times daily with meals. 90 tablet 3  . sitaGLIPtin (JANUVIA) 50 MG tablet Take 1 tablet (50 mg total) by mouth daily. 30 tablet 3  . losartan (COZAAR) 100 MG tablet Take 1 tablet (100 mg total) by mouth daily. 90 tablet 3  . ranitidine (ZANTAC) 150 MG capsule Take 1 capsule (150 mg total) by mouth every evening. 14 capsule 0   No current  facility-administered medications for this visit.     No Known Allergies  Past Medical History:  Diagnosis Date  . Diabetes mellitus without complication ([place])   . Hypertension     Blood pressure 128/82, pulse 74, height '5\' 8"'  (1.727 m), weight 211 lb (95.7 kg), SpO2 97 %.  HTN (hypertension) Blood pressure well controlled today during office visit. Noted reported home BP reading remain elevated but patient was using inappropriate technique with wrist cuff. Long time was spent taking about lifestyle modifications to improve sodium intake and to control acid reflux. Will work on lifestyle modification, continue current medication without changes,  monitor BP twice daily and bring records to follow up appoinment in 4 weeks.  Patient also instructed to take ranitidine 14m daily x 14 days to improve acid reflux and contact PCP if problems continues after 14 days.    Bunyan Brier Rodriguez-Guzman PharmD, BCPS, [id number]/28/2019 8:44 PM

## 2018-02-16 NOTE — Patient Instructions (Addendum)
Return for a  follow up appointment in 4 weeks  Check your blood pressure at home daily (if able) and keep record of the readings.  Take your BP meds as follows: *CONTINUE all medication as prescribed* *START ranitidine 150mg  daily for 14 days; follow up with PCP there after if symptoms persist*   Bring all of your meds, your BP cuff and your record of home blood pressures to your next appointment.  Exercise as you're able, try to walk approximately 30 minutes per day.  Keep salt intake to a minimum, especially watch canned and prepared boxed foods.  Eat more fresh fruits and vegetables and fewer canned items.  Avoid eating in fast food restaurants.    HOW TO TAKE YOUR BLOOD PRESSURE: . Rest 5 minutes before taking your blood pressure. .  Don't smoke or drink caffeinated beverages for at least 30 minutes before. . Take your blood pressure before (not after) you eat. . Sit comfortably with your back supported and both feet on the floor (don't cross your legs). . Elevate your arm to heart level on a table or a desk. . Use the proper sized cuff. It should fit smoothly and snugly around your bare upper arm. There should be enough room to slip a fingertip under the cuff. The bottom edge of the cuff should be 1 inch above the crease of the elbow. . Ideally, take 3 measurements at one sitting and record the average.

## 2018-02-27 ENCOUNTER — Encounter: Payer: Self-pay | Admitting: Family Medicine

## 2018-02-27 ENCOUNTER — Ambulatory Visit (INDEPENDENT_AMBULATORY_CARE_PROVIDER_SITE_OTHER): Payer: Self-pay

## 2018-02-27 VITALS — BP 144/80 | HR 68 | Temp 98.4°F | Ht 68.0 in | Wt 208.0 lb

## 2018-02-27 DIAGNOSIS — Z719 Counseling, unspecified: Secondary | ICD-10-CM

## 2018-02-27 NOTE — Progress Notes (Signed)
Letter provided requesting waiver from college math class secondary to recent grief following the loss of son and major health problems.

## 2018-03-15 MED FILL — ATORVASTATIN 40 MG TABLET: 40 | 30 days supply | Qty: 30 | Fill #1

## 2018-03-15 MED FILL — CHLORTHALIDONE 25 MG TABLET: 25 | 30 days supply | Qty: 30 | Fill #1

## 2018-03-15 MED FILL — !JANUVIA 50 MG TABLET: 50 | 30 days supply | Qty: 30 | Fill #2

## 2018-03-21 ENCOUNTER — Ambulatory Visit: Payer: Self-pay

## 2018-03-21 NOTE — Progress Notes (Deleted)
HPI:  Kimberly Herman is a 52 y.o. female patient of Dr Gwenlyn Found, with a PMH below who presents today for hypertension clinic follow up.  In addition to hypertension her medical history is significant for diabetes (A1c 6.7) and hyperlipidemia.  She was originally referred to Dr. Gwenlyn Found for shortness of breath.  An echocardiogram showed normal LV systolic function and grade 1 diastolic dysfunction and stress test was normal.  EF 55-65%.   At her last HTN clinic visit , BP was elevated and her losartan was increased to 100 mg daily (5 wks ago).  She returns today for follow up.  She is feeling well overall, but does note swelling in her ankles is uchanged.  Denies dizziness, headaches, lightheadedness, increased fatigue or any other problems with current therapy. Her primary  Complain is significant reflux and cough.   Blood Pressure Goal:  130/80  Current Medications:  Amlodipine 7.5 mg daily in AM  Chlorthalidone 26m daily in AM  Losartan 1044mdaily in evening  Family History:  Mother at 777 lung cancer  Father 9284 old age  No known heart disease in her siblings or two living children   Social History:  No tobacco, social alcohol only; occasional caffeine  Diet:  Mix of home/out; trying to stay away from fast foods; prefers [employer] or sandwich shops; avoids pork; eats plenty of vegetables/fruits  Exercise:  No regular exercise; has started with occasional walks  Home BP readings:  Wrist cuff < 1 yr old; calibrated in clinic today. Accurate within 10 mm/Hg when using appropriate technique.  9 home reading provided; average 143/95 (pulse 69 - 83 bpm)   Intolerances:   nkda Labs:  08/2017:  Na 141, K 3.8, Glu186, Scr 0.94, BUN 14;  CrCl = 104.6  Wt Readings from Last 3 Encounters:  02/27/18 208 lb (94.3 kg)  02/16/18 211 lb (95.7 kg)  01/18/18 212 lb (96.2 kg)   BP Readings from Last 3 Encounters:  02/27/18 (!) 144/80  02/16/18 128/82  01/18/18 (!) 158/91   Pulse  Readings from Last 3 Encounters:  02/27/18 68  02/16/18 74  01/18/18 71    Current Outpatient Medications  Medication Sig Dispense Refill  . amLODipine (NORVASC) 5 MG tablet Take 1.5 tablets (7.5 mg total) by mouth daily. 90 tablet 1  . atorvastatin (LIPITOR) 40 MG tablet Take 1 tablet (40 mg total) by mouth daily. 90 tablet 3  . blood glucose meter kit and supplies KIT Dispense based on patient and insurance preference. Use up to four times daily as directed. (FOR ICD-9 250.00, 250.01). 1 each 0  . Blood Pressure Monitoring (BLOOD PRESSURE CUFF) MISC Check blood pressure once daily at the same time and record readings 1 each 0  . chlorthalidone (HYGROTON) 25 MG tablet Take 1 tablet (25 mg total) by mouth daily. 30 tablet 6  . glucose blood (TRUE METRIX BLOOD GLUCOSE TEST) test strip TEST UP TO FOUR TIMES DAILY 100 each 0  . losartan (COZAAR) 100 MG tablet Take 1 tablet (100 mg total) by mouth daily. 90 tablet 3  . metFORMIN (GLUCOPHAGE) 500 MG tablet Take 1 tablet (500 mg total) by mouth 3 (three) times daily with meals. 90 tablet 3  . ranitidine (ZANTAC) 150 MG capsule Take 1 capsule (150 mg total) by mouth every evening. 14 capsule 0  . sitaGLIPtin (JANUVIA) 50 MG tablet Take 1 tablet (50 mg total) by mouth daily. 30 tablet 3   No current  facility-administered medications for this visit.     No Known Allergies  Past Medical History:  Diagnosis Date  . Diabetes mellitus without complication ([facility])   . Hypertension     There were no vitals taken for this visit.  No problem-specific Assessment & Plan notes found for this encounter.   Raquel Rodriguez-Guzman PharmD, BCPS, [place] [address] 03/21/2018 7:07 AM

## 2018-05-04 ENCOUNTER — Other Ambulatory Visit: Payer: Self-pay | Admitting: Family Medicine

## 2018-05-04 DIAGNOSIS — E119 Type 2 diabetes mellitus without complications: Secondary | ICD-10-CM

## 2018-05-04 DIAGNOSIS — Z794 Long term (current) use of insulin: Principal | ICD-10-CM

## 2018-05-04 MED FILL — !JANUVIA 50 MG TABLET: 50 | 30 days supply | Qty: 30 | Fill #3

## 2018-05-04 MED FILL — metFORMIN HCL 500 MG TABS: 500 | 30 days supply | Qty: 90 | Fill #0

## 2018-05-04 MED FILL — CHLORTHALIDONE 25 MG TAB: 25 | 30 days supply | Qty: 30 | Fill #2

## 2018-05-04 MED FILL — ATORVASTATIN CALCIUM 40 MG: 40 | 30 days supply | Qty: 30 | Fill #2

## 2018-05-04 MED FILL — LOSARTAN POTASSIUM 100 MG T: 100 | 30 days supply | Qty: 30 | Fill #1

## 2018-05-15 ENCOUNTER — Ambulatory Visit (INDEPENDENT_AMBULATORY_CARE_PROVIDER_SITE_OTHER): Payer: Self-pay | Admitting: Family Medicine

## 2018-05-15 ENCOUNTER — Encounter: Payer: Self-pay | Admitting: Family Medicine

## 2018-05-15 VITALS — BP 132/90 | HR 84 | Temp 98.2°F | Ht 68.0 in | Wt 210.0 lb

## 2018-05-15 DIAGNOSIS — E119 Type 2 diabetes mellitus without complications: Secondary | ICD-10-CM

## 2018-05-15 DIAGNOSIS — N390 Urinary tract infection, site not specified: Secondary | ICD-10-CM

## 2018-05-15 DIAGNOSIS — R319 Hematuria, unspecified: Secondary | ICD-10-CM

## 2018-05-15 DIAGNOSIS — Z09 Encounter for follow-up examination after completed treatment for conditions other than malignant neoplasm: Secondary | ICD-10-CM

## 2018-05-15 DIAGNOSIS — I10 Essential (primary) hypertension: Secondary | ICD-10-CM

## 2018-05-15 DIAGNOSIS — R8290 Unspecified abnormal findings in urine: Secondary | ICD-10-CM

## 2018-05-15 DIAGNOSIS — R350 Frequency of micturition: Secondary | ICD-10-CM

## 2018-05-15 LAB — POCT URINALYSIS DIP (MANUAL ENTRY)
Bilirubin, UA: NEGATIVE
Glucose, UA: NEGATIVE mg/dL
Ketones, POC UA: NEGATIVE mg/dL
Nitrite, UA: NEGATIVE
Protein Ur, POC: 100 mg/dL — AB
Spec Grav, UA: 1.015 (ref 1.010–1.025)
Urobilinogen, UA: 0.2 E.U./dL
pH, UA: 5.5 (ref 5.0–8.0)

## 2018-05-15 MED ORDER — CHLORTHALIDONE 25 MG PO TABS
25.0000 mg | ORAL_TABLET | Freq: Every day | ORAL | 6 refills | Status: DC
Start: 2018-05-15 — End: 2018-09-20

## 2018-05-15 MED ORDER — SULFAMETHOXAZOLE-TRIMETHOPRIM 800-160 MG PO TABS: 1.0000 | ORAL_TABLET | Freq: Two times a day (BID) | ORAL | 0 refills | Status: DC

## 2018-05-15 MED FILL — SULFAMETHOXAZOLE-TMP DS TAB: 800-160 | 10 days supply | Qty: 20 | Fill #0

## 2018-05-15 NOTE — Progress Notes (Signed)
Subjective:    Patient ID: Kimberly Herman, female    DOB: 04/27/1966, 52 y.o.   MRN: [id number]   PCP: Kimberly Becton, NP  Chief Complaint  Patient presents with  . Urinary Frequency  . Flank Pain    HPI  Kimberly Herman has a past medical history of Hypertension and Diabetes. She is here for a signs and symptoms UTI.   Current Status: She is doing well with no complaints. She denies fevers, chills, fatigue, recent infections, weight loss, and night sweats.   She has not had any headaches, visual changes, dizziness, and falls.   No chest pain, heart palpitations, cough and shortness of breath reported.   No reports of GI problems. She has no reports of blood in stools, dysuria and hematuria. She states that she has cloudy urine.   No depression or anxiety.   She has no pain today.   Social History   Socioeconomic History  . Marital status: Single    Spouse name: Not on file  . Number of children: Not on file  . Years of education: Not on file  . Highest education level: Not on file  Occupational History  . Not on file  Social Needs  . Financial resource strain: Not on file  . Food insecurity:    Worry: Not on file    Inability: Not on file  . Transportation needs:    Medical: Not on file    Non-medical: Not on file  Tobacco Use  . Smoking status: Never Smoker  . Smokeless tobacco: Never Used  Substance and Sexual Activity  . Alcohol use: Yes  . Drug use: No  . Sexual activity: Not on file  Lifestyle  . Physical activity:    Days per week: Not on file    Minutes per session: Not on file  . Stress: Not on file  Relationships  . Social connections:    Talks on phone: Not on file    Gets together: Not on file    Attends religious service: Not on file    Active member of club or organization: Not on file    Attends meetings of clubs or organizations: Not on file    Relationship status: Not on file  . Intimate partner violence:    Fear of current or ex partner:  Not on file    Emotionally abused: Not on file    Physically abused: Not on file    Forced sexual activity: Not on file  Other Topics Concern  . Not on file  Social History Narrative   Patient recently lost youngest son he was killed in a motor cycle accident at age 12. June 2018    History reviewed. No pertinent surgical history.    There is no immunization history on file for this patient.   Current Meds  Medication Sig  . amLODipine (NORVASC) 5 MG tablet Take 1.5 tablets (7.5 mg total) by mouth daily.  [name] atorvastatin (LIPITOR) 40 MG tablet Take 1 tablet (40 mg total) by mouth daily.  . blood glucose meter kit and supplies KIT Dispense based on patient and insurance preference. Use up to four times daily as directed. (FOR ICD-9 250.00, 250.01).  . Blood Pressure Monitoring (BLOOD PRESSURE CUFF) MISC Check blood pressure once daily at the same time and record readings  . glucose blood (TRUE METRIX BLOOD GLUCOSE TEST) test strip TEST UP TO FOUR TIMES DAILY  . losartan (COZAAR) 100 MG tablet Take 1 tablet (100  mg total) by mouth daily.  . metFORMIN (GLUCOPHAGE) 500 MG tablet TAKE 1 TABLET BY MOUTH 3 TIMES DAILY WITH MEALS.  . ranitidine (ZANTAC) 150 MG capsule Take 1 capsule (150 mg total) by mouth every evening.  . sitaGLIPtin (JANUVIA) 50 MG tablet Take 1 tablet (50 mg total) by mouth daily.    No Known Allergies  BP 132/90 (BP Location: Left Arm, Patient Position: Sitting, Cuff Size: Small)   Pulse 84   Temp 98.2 F (36.8 C) (Oral)   Ht _0  (1.727 m)   Wt 210 lb (95.3 kg)   SpO2 96%   BMI 31.93 kg/m   Review of Systems  Constitutional: Negative.   HENT: Negative.   Eyes: Negative.   Respiratory: Negative.   Cardiovascular: Negative.   Gastrointestinal: Negative.   Endocrine: Negative.   Genitourinary: Negative.   Musculoskeletal: Negative.   Skin: Negative.   Allergic/Immunologic: Negative.   Neurological: Negative.   Hematological: Negative.    Psychiatric/Behavioral: Negative.    Objective:   Physical Exam  Constitutional: She is oriented to person, place, and time. She appears well-developed and well-nourished.  HENT:  Head: Normocephalic and atraumatic.  Right Ear: External ear normal.  Left Ear: External ear normal.  Nose: Nose normal.  Mouth/Throat: Oropharynx is clear and moist.  Eyes: Pupils are equal, round, and reactive to light. Conjunctivae and EOM are normal.  Neck: Normal range of motion. Neck supple.  Cardiovascular: Normal rate, regular rhythm, normal heart sounds and intact distal pulses.  Pulmonary/Chest: Effort normal and breath sounds normal.  Abdominal: Soft. Bowel sounds are normal.  Musculoskeletal: Normal range of motion.  Neurological: She is alert and oriented to person, place, and time.  Skin: Skin is warm and dry. Capillary refill takes less than 2 seconds.  Psychiatric: She has a normal mood and affect. Her behavior is normal. Judgment and thought content normal.  Nursing note and vitals reviewed.  Assessment & Plan:   1. Urine frequency - POCT urinalysis dipstick  2. Urinary tract infection with hematuria, site unspecified - sulfamethoxazole-trimethoprim (BACTRIM DS,SEPTRA DS) 800-160 MG tablet; Take 1 tablet by mouth 2 (two) times daily.  Dispense: 20 tablet; Refill: 0  3. Abnormal urinalysis Urinalysis is positive for Blood, Protein, and Leukocytes. We will send Rx for Bactrim to pharmacy today.   4. Essential hypertension Blood pressure is stable at 132/90.  Continue Amlodipine and [place] as prescribed.  - chlorthalidone (HYGROTON) 25 MG tablet; Take 1 tablet (25 mg total) by mouth daily.  Dispense: 30 tablet; Refill: 6  5. Type 2 diabetes mellitus without complication, without long-term current use of insulin (H[facility]) Stable. She will continue Metformin and Januvia as prescribed.   6. Follow up She will follow up in 6 months.   Meds ordered this encounter  Medications  .  sulfamethoxazole-trimethoprim (BACTRIM DS,SEPTRA DS) 800-160 MG tablet    Sig: Take 1 tablet by mouth 2 (two) times daily.    Dispense:  20 tablet    Refill:  0  . chlorthalidone (HYGROTON) 25 MG tablet    Sig: Take 1 tablet (25 mg total) by mouth daily.    Dispense:  30 tablet    Refill:  [place],  MSN, FNP-BC Patient [facility] [address] [place], [place] [address] [phone]

## 2018-06-15 ENCOUNTER — Telehealth: Payer: Self-pay

## 2018-06-27 ENCOUNTER — Telehealth: Payer: Self-pay | Admitting: Family Medicine

## 2018-06-27 NOTE — Telephone Encounter (Signed)
Several attempts to contact patient regarding recent antibiotic Rx. Office phone number to our office [phone]) left with grandson today.

## 2018-08-16 MED FILL — ATORVASTATIN CALCIUM 40 MG: 40 | 30 days supply | Qty: 30 | Fill #3

## 2018-08-16 MED FILL — metFORMIN HCL 500 MG TABS: 500 | 30 days supply | Qty: 90 | Fill #1

## 2018-08-16 MED FILL — LOSARTAN POTASSIUM 100 MG T: 100 | 30 days supply | Qty: 30 | Fill #2

## 2018-08-16 MED FILL — CHLORTHALIDONE 25 MG TAB: 25 | 30 days supply | Qty: 30 | Fill #3

## 2018-09-19 ENCOUNTER — Telehealth: Payer: Self-pay

## 2018-09-19 NOTE — Telephone Encounter (Signed)
Called, someone answered but did not speak. Talking in the back ground. Unable to verify appointment. Thanks!

## 2018-09-20 ENCOUNTER — Ambulatory Visit (INDEPENDENT_AMBULATORY_CARE_PROVIDER_SITE_OTHER): Payer: Self-pay | Admitting: Family Medicine

## 2018-09-20 ENCOUNTER — Encounter: Payer: Self-pay | Admitting: Family Medicine

## 2018-09-20 VITALS — BP 158/96 | HR 74 | Temp 97.8°F | Resp 16 | Ht 68.0 in | Wt 203.0 lb

## 2018-09-20 DIAGNOSIS — Z794 Long term (current) use of insulin: Secondary | ICD-10-CM

## 2018-09-20 DIAGNOSIS — E119 Type 2 diabetes mellitus without complications: Secondary | ICD-10-CM

## 2018-09-20 DIAGNOSIS — R350 Frequency of micturition: Secondary | ICD-10-CM

## 2018-09-20 DIAGNOSIS — Z09 Encounter for follow-up examination after completed treatment for conditions other than malignant neoplasm: Secondary | ICD-10-CM

## 2018-09-20 DIAGNOSIS — I10 Essential (primary) hypertension: Secondary | ICD-10-CM

## 2018-09-20 DIAGNOSIS — R7309 Other abnormal glucose: Secondary | ICD-10-CM

## 2018-09-20 DIAGNOSIS — R8290 Unspecified abnormal findings in urine: Secondary | ICD-10-CM

## 2018-09-20 LAB — POCT URINALYSIS DIP (MANUAL ENTRY)
Bilirubin, UA: NEGATIVE
Glucose, UA: 500 mg/dL — AB
Ketones, POC UA: NEGATIVE mg/dL
Nitrite, UA: POSITIVE — AB
Protein Ur, POC: >=300 mg/dL — AB
Spec Grav, UA: 1.025 (ref 1.010–1.025)
Urobilinogen, UA: 0.2 E.U./dL
pH, UA: 5.5 (ref 5.0–8.0)

## 2018-09-20 LAB — GLUCOSE, POCT (MANUAL RESULT ENTRY)
POC Glucose: 318 mg/dl — AB (ref 70–99)
POC Glucose: 312 mg/dl — AB (ref 70–99)

## 2018-09-20 LAB — POCT GLYCOSYLATED HEMOGLOBIN (HGB A1C): Hemoglobin A1C: 9.4 % — AB (ref 4.0–5.6)

## 2018-09-20 MED ORDER — METFORMIN HCL 500 MG PO TABS: ORAL_TABLET | ORAL | 1 refills | Status: DC

## 2018-09-20 MED ORDER — ATORVASTATIN CALCIUM 40 MG PO TABS: 40.0000 mg | ORAL_TABLET | Freq: Every day | ORAL | 3 refills | Status: DC

## 2018-09-20 MED ORDER — INSULIN ASPART 100 UNIT/ML ~~LOC~~ SOLN
10.0000 [IU] | SUBCUTANEOUS | 6 refills | Status: DC | PRN
Start: 2018-09-20 — End: 2019-08-29

## 2018-09-20 MED ORDER — CYCLOBENZAPRINE HCL 10 MG PO TABS: 10.0000 mg | ORAL_TABLET | Freq: Three times a day (TID) | ORAL | 3 refills | Status: DC | PRN

## 2018-09-20 MED ORDER — INSULIN GLARGINE 100 UNIT/ML SOLOSTAR PEN: 20.0000 [IU] | PEN_INJECTOR | Freq: Every day | SUBCUTANEOUS | 6 refills | Status: DC

## 2018-09-20 MED ORDER — INSULIN LISPRO 100 UNIT/ML ~~LOC~~ SOLN
10.0000 [IU] | Freq: Once | SUBCUTANEOUS | Status: AC
  Administered 2018-09-20: 09:00:00 10 [IU] via SUBCUTANEOUS

## 2018-09-20 MED ORDER — SITAGLIPTIN PHOSPHATE 50 MG PO TABS: 50.0000 mg | ORAL_TABLET | Freq: Every day | ORAL | 1 refills | Status: DC

## 2018-09-20 MED ORDER — AMLODIPINE BESYLATE 5 MG PO TABS: 7.5000 mg | ORAL_TABLET | Freq: Every day | ORAL | 1 refills | Status: DC

## 2018-09-20 MED ORDER — ESOMEPRAZOLE MAGNESIUM 20 MG PO CPDR: 20.0000 mg | DELAYED_RELEASE_CAPSULE | Freq: Every day | ORAL | 1 refills | Status: DC

## 2018-09-20 MED ORDER — LOSARTAN POTASSIUM 100 MG PO TABS: 100.0000 mg | ORAL_TABLET | Freq: Every day | ORAL | 1 refills | Status: DC

## 2018-09-20 MED ORDER — CHLORTHALIDONE 25 MG PO TABS: 25.0000 mg | ORAL_TABLET | Freq: Every day | ORAL | 1 refills | Status: DC

## 2018-09-20 MED ORDER — NITROFURANTOIN MONOHYD MACRO 100 MG PO CAPS: 100.0000 mg | ORAL_CAPSULE | Freq: Two times a day (BID) | ORAL | 0 refills | Status: AC

## 2018-09-20 MED FILL — ATORVASTATIN 40 MG TABLET: 40 | 30 days supply | Qty: 30 | Fill #0

## 2018-09-20 MED FILL — !JANUVIA 50 MG TABLET: 50 | 30 days supply | Qty: 30 | Fill #0

## 2018-09-20 MED FILL — LOSARTAN POTASSIUM 100 MG T: 100 | 30 days supply | Qty: 30 | Fill #0

## 2018-09-20 MED FILL — NovoLOG 100 UNIT/ML SOLN: 100 | 28 days supply | Qty: 10 | Fill #0

## 2018-09-20 MED FILL — !LANTUS SOLOSTAR 100UNITS/M: 100 | 30 days supply | Qty: 6 | Fill #0

## 2018-09-20 NOTE — Patient Instructions (Addendum)
Cyclobenzaprine tablets What is this medicine? CYCLOBENZAPRINE (sye kloe BEN za preen) is a muscle relaxer. It is used to treat muscle pain, spasms, and stiffness. This medicine may be used for other purposes; ask your health care provider or pharmacist if you have questions. COMMON BRAND NAME(S): Fexmid, Flexeril What should I tell my health care provider before I take this medicine? They need to know if you have any of these conditions: -heart disease, irregular heartbeat, or previous heart attack -liver disease -thyroid problem -an unusual or allergic reaction to cyclobenzaprine, tricyclic antidepressants, lactose, other medicines, foods, dyes, or preservatives -pregnant or trying to get pregnant -breast-feeding How should I use this medicine? Take this medicine by mouth with a glass of water. Follow the directions on the prescription label. If this medicine upsets your stomach, take it with food or milk. Take your medicine at regular intervals. Do not take it more often than directed. Talk to your pediatrician regarding the use of this medicine in children. Special care may be needed. Overdosage: If you think you have taken too much of this medicine contact a poison control center or emergency room at once. NOTE: This medicine is only for you. Do not share this medicine with others. What if I miss a dose? If you miss a dose, take it as soon as you can. If it is almost time for your next dose, take only that dose. Do not take double or extra doses. What may interact with this medicine? Do not take this medicine with any of the following medications: -certain medicines for fungal infections like fluconazole, itraconazole, ketoconazole, posaconazole, voriconazole -cisapride -dofetilide -dronedarone -halofantrine -levomethadyl -MAOIs like Carbex, Eldepryl, Marplan, Nardil, and Parnate -narcotic medicines for cough -pimozide -thioridazine -ziprasidone This medicine may also interact  with the following medications: -alcohol -antihistamines for allergy, cough and cold -certain medicines for anxiety or sleep -certain medicines for cancer -certain medicines for depression like amitriptyline, fluoxetine, sertraline -certain medicines for infection like alfuzosin, chloroquine, clarithromycin, levofloxacin, mefloquine, pentamidine, troleandomycin -certain medicines for irregular heart beat -certain medicines for seizures like phenobarbital, primidone -contrast dyes -general anesthetics like halothane, isoflurane, methoxyflurane, propofol -local anesthetics like lidocaine, pramoxine, tetracaine -medicines that relax muscles for surgery -narcotic medicines for pain -other medicines that prolong the QT interval (cause an abnormal heart rhythm) -phenothiazines like chlorpromazine, mesoridazine, prochlorperazine This list may not describe all possible interactions. Give your health care provider a list of all the medicines, herbs, non-prescription drugs, or dietary supplements you use. Also tell them if you smoke, drink alcohol, or use illegal drugs. Some items may interact with your medicine. What should I watch for while using this medicine? Tell your doctor or health care professional if your symptoms do not start to get better or if they get worse. You may get drowsy or dizzy. Do not drive, use machinery, or do anything that needs mental alertness until you know how this medicine affects you. Do not stand or sit up quickly, especially if you are an older patient. This reduces the risk of dizzy or fainting spells. Alcohol may interfere with the effect of this medicine. Avoid alcoholic drinks. If you are taking another medicine that also causes drowsiness, you may have more side effects. Give your health care provider a list of all medicines you use. Your doctor will tell you how much medicine to take. Do not take more medicine than directed. Call emergency for help if you have  problems breathing or unusual sleepiness. Your mouth may get dry. Chewing   sugarless gum or sucking hard candy, and drinking plenty of water may help. Contact your doctor if the problem does not go away or is severe. What side effects may I notice from receiving this medicine? Side effects that you should report to your doctor or health care professional as soon as possible: -allergic reactions like skin rash, itching or hives, swelling of the face, lips, or tongue -breathing problems -chest pain -fast, irregular heartbeat -hallucinations -seizures -unusually weak or tired Side effects that usually do not require medical attention (report to your doctor or health care professional if they continue or are bothersome): -headache -nausea, vomiting This list may not describe all possible side effects. Call your doctor for medical advice about side effects. You may report side effects to FDA at 1-800-FDA-1088. Where should I keep my medicine? Keep out of the reach of children. Store at room temperature between 15 and 30 degrees C (59 and 86 degrees F). Keep container tightly closed. Throw away any unused medicine after the expiration date. NOTE: This sheet is a summary. It may not cover all possible information. If you have questions about this medicine, talk to your doctor, pharmacist, or health care provider.  2018 Elsevier/Gold Standard (2015-08-19 12:05:46) Nitrofurantoin tablets or capsules What is this medicine? NITROFURANTOIN (nye troe fyoor AN toyn) is an antibiotic. It is used to treat urinary tract infections. This medicine may be used for other purposes; ask your health care provider or pharmacist if you have questions. COMMON BRAND NAME(S): Macrobid, Macrodantin, Urotoin What should I tell my health care provider before I take this medicine? They need to know if you have any of these conditions: -anemia -diabetes -glucose-6-phosphate dehydrogenase deficiency -kidney  disease -liver disease -lung disease -other chronic illness -an unusual or allergic reaction to nitrofurantoin, other antibiotics, other medicines, foods, dyes or preservatives -pregnant or trying to get pregnant -breast-feeding How should I use this medicine? Take this medicine by mouth with a glass of water. Follow the directions on the prescription label. Take this medicine with food or milk. Take your doses at regular intervals. Do not take your medicine more often than directed. Do not stop taking except on your doctor's advice. Talk to your pediatrician regarding the use of this medicine in children. While this drug may be prescribed for selected conditions, precautions do apply. Overdosage: If you think you have taken too much of this medicine contact a poison control center or emergency room at once. NOTE: This medicine is only for you. Do not share this medicine with others. What if I miss a dose? If you miss a dose, take it as soon as you can. If it is almost time for your next dose, take only that dose. Do not take double or extra doses. What may interact with this medicine? -antacids containing magnesium trisilicate -probenecid -quinolone antibiotics like ciprofloxacin, lomefloxacin, norfloxacin and ofloxacin -sulfinpyrazone This list may not describe all possible interactions. Give your health care provider a list of all the medicines, herbs, non-prescription drugs, or dietary supplements you use. Also tell them if you smoke, drink alcohol, or use illegal drugs. Some items may interact with your medicine. What should I watch for while using this medicine? Tell your doctor or health care professional if your symptoms do not improve or if you get new symptoms. Drink several glasses of water a day. If you are taking this medicine for a long time, visit your doctor for regular checks on your progress. If you are diabetic, you may get  a false positive result for sugar in your urine with  certain brands of urine tests. Check with your doctor. What side effects may I notice from receiving this medicine? Side effects that you should report to your doctor or health care professional as soon as possible: -allergic reactions like skin rash or hives, swelling of the face, lips, or tongue -chest pain -cough -difficulty breathing -dizziness, drowsiness -fever or infection -joint aches or pains -pale or blue-tinted skin -redness, blistering, peeling or loosening of the skin, including inside the mouth -tingling, burning, pain, or numbness in hands or feet -unusual bleeding or bruising -unusually weak or tired -yellowing of eyes or skin Side effects that usually do not require medical attention (report to your doctor or health care professional if they continue or are bothersome): -dark urine -diarrhea -headache -loss of appetite -nausea or vomiting -temporary hair loss This list may not describe all possible side effects. Call your doctor for medical advice about side effects. You may report side effects to FDA at 1-800-FDA-1088. Where should I keep my medicine? Keep out of the reach of children. Store at room temperature between 15 and 30 degrees C (59 and 86 degrees F). Protect from light. Throw away any unused medicine after the expiration date. NOTE: This sheet is a summary. It may not cover all possible information. If you have questions about this medicine, talk to your doctor, pharmacist, or health care provider.  2018 Elsevier/Gold Standard (2008-05-29 15:56:47)

## 2018-09-20 NOTE — Progress Notes (Signed)
Follow Up  Subjective:    Patient ID: Kimberly Herman, female    DOB: 10/20/1966, 52 y.o.   MRN: [id number]   Chief Complaint  Patient presents with  . Diabetes  . Dysuria    painful and odor with urination. wants cipro     HPI Kimberly Herman is a 52 year old female with a past medical history of Hypertension, and Diabetes. She is here today for follow up assessment of chronic diseases.   Current Status: Since her last office visit, she is doing well with no complaints. She has not been taking her medications as prescribed for over 2 months now. She has chronic back pain, which she takes Motrin for relief. We reviewed her blood glucose readings which revealed a range of 510-230. She is aware that her levels need to be lower. She admits frequent urination, hunger, fatigue, blurred vision, excessive hunger, excessive thirst. She denies weight gain, weight loss, and poor wound healing. She denies visual changes, chest pain, cough, shortness of breath, heart palpitations, and falls. She has occasionally headaches and dizziness with position changes. Denies severe headaches, confusion, seizures, double vision, and blurred vision, nausea and vomiting. She has mild anxiety today.   She denies fevers, chills, fatigue, recent infections, weight loss, and night sweats.  No reports of GI problems such as diarrhea, and constipation. She has no reports of blood in stools, dysuria and hematuria. No depression or anxiety reported.   Review of Systems  Constitutional: Negative.   HENT: Negative.   Eyes: Negative.   Respiratory: Negative.   Cardiovascular: Negative.   Gastrointestinal: Positive for abdominal distention (Obese).  Genitourinary: Negative.   Musculoskeletal: Positive for back pain (Chronic ).  Skin: Negative.   Neurological: Positive for headaches (occasional).  Psychiatric/Behavioral: Negative.    Objective:   Physical Exam  Constitutional: She is oriented to person, place, and time. She  appears well-developed and well-nourished.  Cardiovascular: Normal rate, regular rhythm, normal heart sounds and intact distal pulses.  Pulmonary/Chest: Effort normal and breath sounds normal.  Abdominal: Soft. Bowel sounds are normal.  Musculoskeletal: Normal range of motion.  Neurological: She is alert and oriented to person, place, and time.  Skin: Skin is warm and dry.  Psychiatric: She has a normal mood and affect. Her behavior is normal. Judgment and thought content normal.  Nursing note and vitals reviewed.  Assessment & Plan:   1. Type 2 diabetes mellitus without complication, without long-term current use of insulin (HCC) Hgb A1c elevated today at 9.4 today, from 6.7. We will re-start Insulin today for better control of blood glucose readings. She will continue to decrease foods/beverages high in sugars and carbs and follow Heart Healthy or DASH diet. Increase physical activity to at least 30 minutes cardio exercise daily.  - POCT glycosylated hemoglobin (Hb A1C) - POCT urinalysis dipstick - POCT glucose (manual entry) - Insulin Glargine (LANTUS SOLOSTAR) 100 UNIT/ML Solostar Pen; Inject 20 Units into the skin daily at 10 pm.  Dispense: 15 mL; Refill: 6 - insulin aspart (NOVOLOG) 100 UNIT/ML injection; Inject 10 Units into the skin as needed for high blood sugar (blood sugar >240).  Dispense: 10 mL; Refill: 6 - metFORMIN (GLUCOPHAGE) 500 MG tablet; TAKE 1 TABLET BY MOUTH 3 TIMES DAILY WITH MEALS.  Dispense: 270 tablet; Refill: 1  2. Essential hypertension Blood pressure is elevated today. Continue Amlodipine, Chlothalidone, and Losartan as prescribed. She will continue to decrease high sodium intake, excessive alcohol intake, increase potassium intake, smoking cessation, and  increase physical activity of at least 30 minutes of cardio activity daily. She will continue to follow Heart Healthy or DASH diet. - chlorthalidone (HYGROTON) 25 MG tablet; Take 1 tablet (25 mg total) by mouth  daily.  Dispense: 90 tablet; Refill: 1  3. Elevated glucose Blood glucose is elevated today. She was given 10 units of Humalog today. She will continue blood glucose checks at least 3 times a day.  - insulin lispro (HUMALOG) injection 10 Units - POCT glucose (manual entry)  4. Abnormal urinalysis - Urine Culture  5. Urine frequency Probable r/t increased blood glucose levels.   6. Follow up She will follow up in 6 weeks.   Meds ordered this encounter  Medications  . insulin lispro (HUMALOG) injection 10 Units  . amLODipine (NORVASC) 5 MG tablet    Sig: Take 1.5 tablets (7.5 mg total) by mouth daily.    Dispense:  135 tablet    Refill:  1  . atorvastatin (LIPITOR) 40 MG tablet    Sig: Take 1 tablet (40 mg total) by mouth daily.    Dispense:  90 tablet    Refill:  3  . chlorthalidone (HYGROTON) 25 MG tablet    Sig: Take 1 tablet (25 mg total) by mouth daily.    Dispense:  90 tablet    Refill:  1  . losartan (COZAAR) 100 MG tablet    Sig: Take 1 tablet (100 mg total) by mouth daily.    Dispense:  90 tablet    Refill:  1  . metFORMIN (GLUCOPHAGE) 500 MG tablet    Sig: TAKE 1 TABLET BY MOUTH 3 TIMES DAILY WITH MEALS.    Dispense:  270 tablet    Refill:  1  . esomeprazole (NEXIUM) 20 MG capsule    Sig: Take 1 capsule (20 mg total) by mouth daily at 12 noon.    Dispense:  90 capsule    Refill:  1  . sitaGLIPtin (JANUVIA) 50 MG tablet    Sig: Take 1 tablet (50 mg total) by mouth daily.    Dispense:  90 tablet    Refill:  1  . nitrofurantoin, macrocrystal-monohydrate, (MACROBID) 100 MG capsule    Sig: Take 1 capsule (100 mg total) by mouth 2 (two) times daily for 7 days.    Dispense:  14 capsule    Refill:  0  . cyclobenzaprine (FLEXERIL) 10 MG tablet    Sig: Take 1 tablet (10 mg total) by mouth 3 (three) times daily as needed for muscle spasms.    Dispense:  30 tablet    Refill:  3  . Insulin Glargine (LANTUS SOLOSTAR) 100 UNIT/ML Solostar Pen    Sig: Inject 20 Units  into the skin daily at 10 pm.    Dispense:  15 mL    Refill:  6  . insulin aspart (NOVOLOG) 100 UNIT/ML injection    Sig: Inject 10 Units into the skin as needed for high blood sugar (blood sugar >240).    Dispense:  10 mL    Refill:  [place]mo,  MSN, [facility] [address] [place], [place] [address] [phone]

## 2018-09-22 ENCOUNTER — Ambulatory Visit: Payer: Self-pay | Attending: Family Medicine

## 2018-09-22 LAB — URINE CULTURE

## 2018-09-25 ENCOUNTER — Other Ambulatory Visit: Payer: Self-pay | Admitting: Family Medicine

## 2018-09-25 DIAGNOSIS — A498 Other bacterial infections of unspecified site: Secondary | ICD-10-CM

## 2018-11-01 ENCOUNTER — Encounter: Payer: Self-pay | Admitting: Family Medicine

## 2018-11-01 ENCOUNTER — Ambulatory Visit (INDEPENDENT_AMBULATORY_CARE_PROVIDER_SITE_OTHER): Payer: Self-pay | Admitting: Family Medicine

## 2018-11-01 VITALS — BP 144/92 | HR 68 | Temp 98.2°F | Ht 68.0 in | Wt 203.0 lb

## 2018-11-01 DIAGNOSIS — R8290 Unspecified abnormal findings in urine: Secondary | ICD-10-CM

## 2018-11-01 DIAGNOSIS — Z09 Encounter for follow-up examination after completed treatment for conditions other than malignant neoplasm: Secondary | ICD-10-CM

## 2018-11-01 DIAGNOSIS — R319 Hematuria, unspecified: Secondary | ICD-10-CM

## 2018-11-01 DIAGNOSIS — I10 Essential (primary) hypertension: Secondary | ICD-10-CM

## 2018-11-01 DIAGNOSIS — N390 Urinary tract infection, site not specified: Secondary | ICD-10-CM

## 2018-11-01 DIAGNOSIS — E119 Type 2 diabetes mellitus without complications: Secondary | ICD-10-CM

## 2018-11-01 DIAGNOSIS — R7309 Other abnormal glucose: Secondary | ICD-10-CM

## 2018-11-01 DIAGNOSIS — E782 Mixed hyperlipidemia: Secondary | ICD-10-CM

## 2018-11-01 LAB — POCT URINALYSIS DIP (MANUAL ENTRY)
Bilirubin, UA: NEGATIVE
Glucose, UA: NEGATIVE mg/dL
Ketones, POC UA: NEGATIVE mg/dL
Nitrite, UA: POSITIVE — AB
Protein Ur, POC: 100 mg/dL — AB
Spec Grav, UA: 1.025 (ref 1.010–1.025)
Urobilinogen, UA: 0.2 E.U./dL
pH, UA: 5.5 (ref 5.0–8.0)

## 2018-11-01 LAB — GLUCOSE, POCT (MANUAL RESULT ENTRY): POC Glucose: 184 mg/dl — AB (ref 70–99)

## 2018-11-01 LAB — POCT GLYCOSYLATED HEMOGLOBIN (HGB A1C): Hemoglobin A1C: 8.5 % — AB (ref 4.0–5.6)

## 2018-11-01 NOTE — Progress Notes (Signed)
Follow Up  Subjective:    Patient ID: Kimberly Herman, female    DOB: [birth year], 52 y.o.   MRN: [id number]   Chief Complaint  Patient presents with  . Follow-up    chronic condition    HPI  Ms. Kimberly Herman is a 52 year old female with a past medical history of Hypertension and Diabetes. She is here today for assessment and follow up of her chronic diseases.    Current Status: Since her last office visit, she is doing well with no complaints.   She denies visual changes, chest pain, cough, shortness of breath, heart palpitations, and falls. She has occasionally headaches and dizziness with position changes. Denies severe headaches, confusion, seizures, double vision, and blurred vision, nausea and vomiting. She states that she has not been taking Januvia and Metformin. She denies fatigue, frequent urination, excessive hunger, excessive thirst, weight gain, weight loss, and poor wound healing.   She denies fevers, chills, fatigue, recent infections, weight loss, and night sweats. No reports of GI problems such as diarrhea, and constipation. She has no reports of blood in stools, dysuria and hematuria. No depression or anxiety reported. She denies pain today.   Review of Systems  Constitutional: Negative.   HENT: Negative.   Eyes: Negative.   Respiratory: Negative.   Cardiovascular: Negative.   Gastrointestinal: Negative.   Endocrine: Negative.   Genitourinary: Negative.   Musculoskeletal: Negative.   Skin: Negative.   Allergic/Immunologic: Negative.   Neurological: Positive for dizziness and headaches.  Hematological: Negative.   Psychiatric/Behavioral: Negative.    Objective:   Physical Exam  Constitutional: She is oriented to person, place, and time. She appears well-developed and well-nourished.  HENT:  Head: Normocephalic and atraumatic.  Right Ear: External ear normal.  Eyes: Pupils are equal, round, and reactive to light. Conjunctivae and EOM are normal.  Neck: Normal range  of motion. Neck supple.  Cardiovascular: Normal rate, regular rhythm, normal heart sounds and intact distal pulses.  Pulmonary/Chest: Effort normal and breath sounds normal.  Abdominal: Soft. Bowel sounds are normal.  Musculoskeletal: Normal range of motion.  Neurological: She is alert and oriented to person, place, and time.  Skin: Skin is warm and dry.  Psychiatric: She has a normal mood and affect. Her behavior is normal. Judgment and thought content normal.  Nursing note and vitals reviewed.  Assessment & Plan:   1. Essential hypertension Blood pressure is elevated at 144/92 today. Continue Amlodipine and Chlorthalidone as prescribed. She will continue to decrease high sodium intake, excessive alcohol intake, increase potassium intake, smoking cessation, and increase physical activity of at least 30 minutes of cardio activity daily. She will continue to follow Heart Healthy or DASH diet. - POCT urinalysis dipstick  2. Type 2 diabetes mellitus without complication, without long-term current use of insulin (HCC) Improved. Hgb A1c is decreased at 8.5, from 9.4 on 09/20/2018. Continue anti-diabetic medications as prescribed.  - POCT glycosylated hemoglobin (Hb A1C) - POCT glucose (manual entry)  3. Elevated glucose Blood glucose levels are decreased at 184, from 318 on 09/20/2018. Continue Januvia, Metformin, and Insulin as prescribed.  She will continue to decrease foods/beverages high in sugars and carbs and follow Heart Healthy or DASH diet. Increase physical activity to at least 30 minutes cardio exercise daily.   4. Mixed hyperlipidemia Lipid panel stable on 12/12/2017. Continue Lipitor as prescribed. Continue low-fat diet.   5. Abnormal urine - Urine Culture  6. UTI She was previously prescribed Septra at her previous appointment, but  she never picked up Rx. Urinalysis today is positive for bacteria. She will begin Septra today.    7. Follow up She will follow up in 3   Months.   No orders of the defined types were placed in this encounter.  Kimberly Becton,  MSN, FNP-C [facility] [address] [place], [place] [address] [phone] ]

## 2018-11-05 LAB — URINE CULTURE

## 2018-11-08 ENCOUNTER — Other Ambulatory Visit: Payer: Self-pay | Admitting: Family Medicine

## 2018-11-08 DIAGNOSIS — A498 Other bacterial infections of unspecified site: Secondary | ICD-10-CM

## 2018-11-08 MED ORDER — AMOXICILLIN-POT CLAVULANATE 875-125 MG PO TABS: 1.0000 | ORAL_TABLET | Freq: Two times a day (BID) | ORAL | 0 refills | Status: AC

## 2018-11-08 NOTE — Progress Notes (Unsigned)
Rx for Augmentin sent to pharmacy today.

## 2018-11-09 ENCOUNTER — Telehealth: Payer: Self-pay

## 2018-11-09 NOTE — Telephone Encounter (Signed)
-----   Message from Azzie Glatter, [place] sent at 11/08/2018  8:38 PM EST ----- Regarding: "Lab Results" Kimberly Herman,   Urine culture identified bacteria. Please inform patient that we have sent new Rx for Augmentin sent to pharmacy today. This antibiotic is specific to eliminate this bacteria. She is to take medication as directed. She is to complete all medication. Please inform patient. Thank you.

## 2018-11-09 NOTE — Telephone Encounter (Signed)
Patient notified

## 2018-11-30 MED FILL — AMOX-CLAV 875-125 MG TABLET: 875-125 | 7 days supply | Qty: 14 | Fill #0

## 2018-11-30 MED FILL — ATORVASTATIN CALCIUM 40 MG: 40 | 30 days supply | Qty: 30 | Fill #3

## 2018-11-30 MED FILL — metFORMIN HCL 500 MG TABS: 500 | 30 days supply | Qty: 90 | Fill #1

## 2018-11-30 MED FILL — LOSARTAN POTASSIUM 100 MG T: 100 | 30 days supply | Qty: 30 | Fill #2

## 2018-11-30 MED FILL — $JANUVIA 50 MG TABLET: 50 | 90 days supply | Qty: 90 | Fill #1

## 2018-11-30 MED FILL — CHLORTHALIDONE 25 MG TAB: 25 | 30 days supply | Qty: 30 | Fill #0

## 2018-12-15 ENCOUNTER — Other Ambulatory Visit: Payer: Self-pay

## 2018-12-15 ENCOUNTER — Encounter (HOSPITAL_BASED_OUTPATIENT_CLINIC_OR_DEPARTMENT_OTHER): Payer: Self-pay | Admitting: *Deleted

## 2018-12-15 ENCOUNTER — Emergency Department (HOSPITAL_BASED_OUTPATIENT_CLINIC_OR_DEPARTMENT_OTHER)
Admission: EM | Admit: 2018-12-15 | Discharge: 2018-12-15 | Disposition: A | Discharge status: Home / Self Care | Payer: Self-pay | Attending: Emergency Medicine | Admitting: Emergency Medicine

## 2018-12-15 DIAGNOSIS — E1165 Type 2 diabetes mellitus with hyperglycemia: Secondary | ICD-10-CM | POA: Insufficient documentation

## 2018-12-15 DIAGNOSIS — Z794 Long term (current) use of insulin: Secondary | ICD-10-CM | POA: Insufficient documentation

## 2018-12-15 DIAGNOSIS — Z79899 Other long term (current) drug therapy: Secondary | ICD-10-CM | POA: Insufficient documentation

## 2018-12-15 DIAGNOSIS — E876 Hypokalemia: Secondary | ICD-10-CM | POA: Insufficient documentation

## 2018-12-15 DIAGNOSIS — I10 Essential (primary) hypertension: Secondary | ICD-10-CM | POA: Insufficient documentation

## 2018-12-15 DIAGNOSIS — A09 Infectious gastroenteritis and colitis, unspecified: Secondary | ICD-10-CM | POA: Insufficient documentation

## 2018-12-15 LAB — COMPREHENSIVE METABOLIC PANEL
ALT: 25 U/L (ref 0–44)
ANION GAP: 11 (ref 5–15)
AST: 27 U/L (ref 15–41)
Albumin: 4.1 g/dL (ref 3.5–5.0)
Alkaline Phosphatase: 76 U/L (ref 38–126)
BUN: 19 mg/dL (ref 6–20)
CO2: 29 mmol/L (ref 22–32)
Calcium: 8.6 mg/dL — ABNORMAL LOW (ref 8.9–10.3)
Chloride: 98 mmol/L (ref 98–111)
Creatinine, Ser: 1.17 mg/dL — ABNORMAL HIGH (ref 0.44–1.00)
GFR, EST NON AFRICAN AMERICAN: 53 mL/min — AB (ref >60)
Glucose, Bld: 127 mg/dL — ABNORMAL HIGH (ref 70–99)
POTASSIUM: 3 mmol/L — AB (ref 3.5–5.1)
Sodium: 138 mmol/L (ref 135–145)
TOTAL PROTEIN: 8.1 g/dL (ref 6.5–8.1)
Total Bilirubin: 1.2 mg/dL (ref 0.3–1.2)

## 2018-12-15 LAB — CBC WITH DIFFERENTIAL/PLATELET
ABS IMMATURE GRANULOCYTES: 0.02 10*3/uL (ref 0.00–0.07)
Basophils Absolute: 0 10*3/uL (ref 0.0–0.1)
Basophils Relative: 0 %
EOS ABS: 0 10*3/uL (ref 0.0–0.5)
Eosinophils Relative: 0 %
HEMATOCRIT: 36.5 % (ref 36.0–46.0)
Hemoglobin: 12.1 g/dL (ref 12.0–15.0)
IMMATURE GRANULOCYTES: 0 %
Lymphocytes Relative: 12 %
Lymphs Abs: 0.7 10*3/uL (ref 0.7–4.0)
MCH: 29.6 pg (ref 26.0–34.0)
MCHC: 33.2 g/dL (ref 30.0–36.0)
MCV: 89.2 fL (ref 80.0–100.0)
Monocytes Absolute: 0.4 10*3/uL (ref 0.1–1.0)
Monocytes Relative: 7 %
NEUTROS ABS: 4.9 10*3/uL (ref 1.7–7.7)
Neutrophils Relative %: 81 %
Platelets: 205 10*3/uL (ref 150–400)
RBC: 4.09 MIL/uL (ref 3.87–5.11)
RDW: 11.9 % (ref 11.5–15.5)
WBC: 6 10*3/uL (ref 4.0–10.5)
nRBC: 0 % (ref 0.0–0.2)

## 2018-12-15 MED ORDER — POTASSIUM CHLORIDE CRYS ER 20 MEQ PO TBCR: 20.0000 meq | EXTENDED_RELEASE_TABLET | Freq: Every day | ORAL | 0 refills | Status: DC

## 2018-12-15 MED ORDER — ONDANSETRON HCL 4 MG/2ML IJ SOLN
4.0000 mg | Freq: Once | INTRAMUSCULAR | Status: AC
  Administered 2018-12-15: 4 mg via INTRAVENOUS
  Filled 2018-12-15: qty 2

## 2018-12-15 MED ORDER — POTASSIUM CHLORIDE CRYS ER 20 MEQ PO TBCR
40.0000 meq | EXTENDED_RELEASE_TABLET | Freq: Once | ORAL | Status: AC
  Administered 2018-12-15: 40 meq via ORAL
  Filled 2018-12-15: qty 2

## 2018-12-15 MED ORDER — SODIUM CHLORIDE 0.9 % IV BOLUS
1000.0000 mL | Freq: Once | INTRAVENOUS | Status: AC
  Administered 2018-12-15: 1000 mL via INTRAVENOUS

## 2018-12-15 MED ORDER — ONDANSETRON 4 MG PO TBDP: 4.0000 mg | ORAL_TABLET | Freq: Three times a day (TID) | ORAL | 0 refills | Status: DC | PRN

## 2018-12-15 MED ORDER — ACETAMINOPHEN 325 MG PO TABS
650.0000 mg | ORAL_TABLET | Freq: Once | ORAL | Status: AC
  Administered 2018-12-15: 650 mg via ORAL
  Filled 2018-12-15: qty 2

## 2018-12-15 NOTE — ED Triage Notes (Signed)
Fever this am. She took Advil 400 mg 30 minutes ago. She had diarrhea this am. She has a video on her phone with worms swimming in her stool.

## 2018-12-15 NOTE — Discharge Instructions (Signed)
If you develop worsening diarrhea, blood in the stool, abdominal pain, uncontrolled vomiting, continued high fevers, or any other new/concerning symptoms then return to the ER for evaluation.  You will need to bring a stool sample to your doctor to have a test called ova and parasite examination.

## 2018-12-15 NOTE — ED Provider Notes (Signed)
[facility] EMERGENCY DEPARTMENT Provider Note   CSN: [id number] Arrival date & time: 12/15/18  1809     History   Chief Complaint Chief Complaint  Patient presents with  . Fever    HPI Kimberly Herman is a 53 y.o. female.  HPI  52 year old female presents with fever and diarrhea.  She states that last night at work while working third shift she ate grapes and peach.  Neither appear to be rotten.  Early this morning around 8 AM she developed nausea and then had 3 successive bowel movements that were all normal in appearance but more often than typical.  Then she had 1 loose stool consistent with diarrhea.  She saw worms swimming in the toilet bowl afterwards and took a video.  Later she has developed a fever and was up to 102 this afternoon and then she took NSAIDs.  She has not had headache, cough, sore throat, abdominal pain, vomiting, or urinary symptoms.  She feels okay right now.  No further diarrhea since around 8 AM this morning.  She recently traveled to [place] but has not had any travel outside of the [place].  Past Medical History:  Diagnosis Date  . Diabetes mellitus without complication ([facility])   . Hypertension     Patient Active Problem List   Diagnosis Date Noted  . Hyperlipidemia 12/27/2017  . Shortness of breath 12/27/2017  . T2DM (type 2 diabetes mellitus) ([facility]) 03/07/2017  . Hyperglycemia 03/07/2017  . HTN (hypertension) 03/07/2017  . ANEMIA, IRON DEFICIENCY, UNSPEC. 01/19/2007    History reviewed. No pertinent surgical history.   OB History   No obstetric history on file.      Home Medications    Prior to Admission medications   Medication Sig Start Date End Date Taking? Authorizing Provider  amLODipine (NORVASC) 5 MG tablet Take 1.5 tablets (7.5 mg total) by mouth daily. 09/20/18   Azzie Glatter, FNP  atorvastatin (LIPITOR) 40 MG tablet Take 1 tablet (40 mg total) by mouth daily. 09/20/18   Azzie Glatter, FNP  blood  glucose meter kit and supplies KIT Dispense based on patient and insurance preference. Use up to four times daily as directed. (FOR ICD-9 250.00, 250.01). 03/04/17   Scot Jun, FNP  Blood Pressure Monitoring (BLOOD PRESSURE CUFF) MISC Check blood pressure once daily at the same time and record readings 03/07/17   Scot Jun, FNP  chlorthalidone (HYGROTON) 25 MG tablet Take 1 tablet (25 mg total) by mouth daily. 09/20/18 03/19/19  Azzie Glatter, FNP  cyclobenzaprine (FLEXERIL) 10 MG tablet Take 1 tablet (10 mg total) by mouth 3 (three) times daily as needed for muscle spasms. 09/20/18   Azzie Glatter, FNP  esomeprazole (NEXIUM) 20 MG capsule Take 1 capsule (20 mg total) by mouth daily at 12 noon. 09/20/18   Azzie Glatter, FNP  glucose blood (TRUE METRIX BLOOD GLUCOSE TEST) test strip TEST UP TO FOUR TIMES DAILY Patient not taking: Reported on 09/20/2018 05/11/17   Scot Jun, FNP  insulin aspart (NOVOLOG) 100 UNIT/ML injection Inject 10 Units into the skin as needed for high blood sugar (blood sugar >240). 09/20/18   Azzie Glatter, FNP  Insulin Glargine (LANTUS SOLOSTAR) 100 UNIT/ML Solostar Pen Inject 20 Units into the skin daily at 10 pm. 09/20/18   Azzie Glatter, FNP  losartan (COZAAR) 100 MG tablet Take 1 tablet (100 mg total) by mouth daily. 09/20/18   Azzie Glatter, FNP  metFORMIN (GLUCOPHAGE) 500 MG tablet TAKE 1 TABLET BY MOUTH 3 TIMES DAILY WITH MEALS. Patient not taking: Reported on 11/01/2018 09/20/18   Azzie Glatter, FNP  ondansetron (ZOFRAN ODT) 4 MG disintegrating tablet Take 1 tablet (4 mg total) by mouth every 8 (eight) hours as needed. 12/15/18   Sherwood Gambler, MD  potassium chloride SA (K-DUR,KLOR-CON) 20 MEQ tablet Take 1 tablet (20 mEq total) by mouth daily. 12/15/18   Sherwood Gambler, MD  sitaGLIPtin (JANUVIA) 50 MG tablet Take 1 tablet (50 mg total) by mouth daily. Patient not taking: Reported on 11/01/2018 09/20/18   Azzie Glatter, FNP  sulfamethoxazole-trimethoprim (BACTRIM DS,SEPTRA DS) 800-160 MG tablet Take 1 tablet by mouth 2 (two) times daily. 05/15/18   Azzie Glatter, FNP    Family History Family History  Problem Relation Age of Onset  . Cancer Mother     Social History Social History   Tobacco Use  . Smoking status: Never Smoker  . Smokeless tobacco: Never Used  Substance Use Topics  . Alcohol use: Yes  . Drug use: No     Allergies   Patient has no known allergies.   Review of Systems Review of Systems  Constitutional: Positive for fever.  HENT: Negative for sore throat.   Respiratory: Negative for cough and shortness of breath.   Gastrointestinal: Positive for diarrhea and nausea. Negative for abdominal pain, blood in stool and vomiting.  Genitourinary: Negative for dysuria.  Neurological: Negative for headaches.  All other systems reviewed and are negative.    Physical Exam Updated Vital Signs BP (!) 155/88 (BP Location: Right Arm)   Pulse 87   Temp 97.8 F (36.6 C) (Oral)   Resp 18   Ht 5' 8" (1.727 m)   Wt 92.1 kg   SpO2 100%   BMI 30.87 kg/m   Physical Exam Vitals signs and nursing note reviewed.  Constitutional:      Appearance: She is well-developed. She is obese.  HENT:     Head: Normocephalic and atraumatic.     Right Ear: External ear normal.     Left Ear: External ear normal.     Nose: Nose normal.  Eyes:     General:        Right eye: No discharge.        Left eye: No discharge.  Cardiovascular:     Rate and Rhythm: Regular rhythm. Tachycardia present.     Heart sounds: Normal heart sounds.     Comments: HR low 100s Pulmonary:     Effort: Pulmonary effort is normal.     Breath sounds: Normal breath sounds.  Abdominal:     General: There is no distension.     Palpations: Abdomen is soft.     Tenderness: There is no abdominal tenderness.  Skin:    General: Skin is warm and dry.  Neurological:     Mental Status: She is alert.    Psychiatric:        Mood and Affect: Mood is not anxious.      ED Treatments / Results  Labs (all labs ordered are listed, but only abnormal results are displayed) Labs Reviewed  COMPREHENSIVE METABOLIC PANEL - Abnormal; Notable for the following components:      Result Value   Potassium 3.0 (*)    Glucose, Bld 127 (*)    Creatinine, Ser 1.17 (*)    Calcium 8.6 (*)    GFR calc non Af Amer 53 (*)  All other components within normal limits  STOOL CULTURE  OVA + PARASITE EXAM  CBC WITH DIFFERENTIAL/PLATELET  URINALYSIS, ROUTINE W REFLEX MICROSCOPIC    EKG None  Radiology No results found.  Procedures Procedures (including critical care time)  Medications Ordered in ED Medications  sodium chloride 0.9 % bolus 1,000 mL (0 mLs Intravenous Stopped 12/15/18 2046)  acetaminophen (TYLENOL) tablet 650 mg (650 mg Oral Given 12/15/18 1906)  ondansetron (ZOFRAN) injection 4 mg (4 mg Intravenous Given 12/15/18 1948)  potassium chloride SA (K-DUR,KLOR-CON) CR tablet 40 mEq (40 mEq Oral Given 12/15/18 1948)     Initial Impression / Assessment and Plan / ED Course  I have reviewed the triage vital signs and the nursing notes.  Pertinent labs & imaging results that were available during my care of the patient were reviewed by me and considered in my medical decision making (see chart for details).     Patient is well-appearing here.  While she did have a fever, she was given Tylenol and fluids.  She has noted to be mildly hypokalemic and was given replacement.  I discussed the report of worms with Dr. Baxter Flattery of infectious disease.  She advised to hold off on any type of treatment and instead have her follow-up with PCP for O&P examination.  Patient has no abdominal pain and is not ill-appearing.  No further diarrhea since 8 AM this morning.  She was advised of strict return precautions and to follow-up with PCP.  Final Clinical Impressions(s) / ED Diagnoses   Final diagnoses:   Infectious diarrhea  Hypokalemia    ED Discharge Orders         Ordered    potassium chloride SA (K-DUR,KLOR-CON) 20 MEQ tablet  Daily     12/15/18 2019    ondansetron (ZOFRAN ODT) 4 MG disintegrating tablet  Every 8 hours PRN     12/15/18 2019           Sherwood Gambler, MD 12/15/18 2254

## 2019-01-08 MED FILL — metFORMIN HCL 500 MG TABS: 500 | 30 days supply | Qty: 90 | Fill #2

## 2019-01-08 MED FILL — ATORVASTATIN CALCIUM 40 MG: 40 | 30 days supply | Qty: 30 | Fill #1

## 2019-01-08 MED FILL — CHLORTHALIDONE 25 MG TAB: 25 | 30 days supply | Qty: 30 | Fill #1

## 2019-01-30 ENCOUNTER — Ambulatory Visit: Payer: Self-pay

## 2019-01-31 ENCOUNTER — Ambulatory Visit: Payer: Self-pay | Admitting: Family Medicine

## 2019-02-20 MED FILL — CHLORTHALIDONE 25 MG TAB: 25 | 30 days supply | Qty: 30 | Fill #2

## 2019-02-20 MED FILL — $JANUVIA 50 MG TABLET: 50 | 60 days supply | Qty: 60 | Fill #2

## 2019-02-20 MED FILL — LOSARTAN POTASSIUM 100 MG T: 100 | 30 days supply | Qty: 30 | Fill #1

## 2019-03-29 NOTE — Telephone Encounter (Signed)
Message sent to provider 

## 2019-03-30 MED FILL — CHLORTHALIDONE 25 MG TAB: 25 | 30 days supply | Qty: 30 | Fill #3

## 2019-03-30 MED FILL — LOSARTAN POTASSIUM 100 MG T: 100 | 30 days supply | Qty: 30 | Fill #2

## 2019-03-30 MED FILL — ATORVASTATIN CALCIUM 40 MG: 40 | 30 days supply | Qty: 30 | Fill #2

## 2019-03-30 MED FILL — metFORMIN HCL 500 MG TABS: 500 | 30 days supply | Qty: 90 | Fill #3

## 2019-05-11 MED FILL — ?ATORVASTATIN 40MG TABLET: 40 | 30 days supply | Qty: 30 | Fill #3

## 2019-05-11 MED FILL — ?CHLORTHALIDONE 25 MG TABLE: 25 | 30 days supply | Qty: 30 | Fill #4

## 2019-05-11 MED FILL — LOSARTAN POTASSIUM 100 MG T: 100 | 30 days supply | Qty: 30 | Fill #3

## 2019-06-28 ENCOUNTER — Other Ambulatory Visit: Payer: Self-pay | Admitting: Family Medicine

## 2019-06-28 MED FILL — ?METFORMIN HCL 500MG TABLET: 500 | 30 days supply | Qty: 90 | Fill #0

## 2019-06-28 MED FILL — LOSARTAN POTASSIUM 100 MG T: 100 | 30 days supply | Qty: 30 | Fill #4

## 2019-06-28 MED FILL — ?ATORVASTATIN 40MG TABLET: 40 | 30 days supply | Qty: 30 | Fill #4

## 2019-06-28 MED FILL — ?CHLORTHALIDONE 25 MG TABLE: 25 | 30 days supply | Qty: 30 | Fill #0

## 2019-07-10 ENCOUNTER — Ambulatory Visit: Payer: Self-pay | Admitting: Family Medicine

## 2019-08-23 DIAGNOSIS — E559 Vitamin D deficiency, unspecified: Secondary | ICD-10-CM

## 2019-08-23 HISTORY — DX: Vitamin D deficiency, unspecified: E55.9

## 2019-08-28 ENCOUNTER — Other Ambulatory Visit: Payer: Self-pay

## 2019-08-28 ENCOUNTER — Encounter: Payer: Self-pay | Admitting: Family Medicine

## 2019-08-28 ENCOUNTER — Ambulatory Visit (INDEPENDENT_AMBULATORY_CARE_PROVIDER_SITE_OTHER): Payer: Self-pay | Admitting: Family Medicine

## 2019-08-28 VITALS — BP 148/77 | HR 74 | Temp 98.5°F | Ht 68.0 in | Wt 202.0 lb

## 2019-08-28 DIAGNOSIS — R319 Hematuria, unspecified: Secondary | ICD-10-CM

## 2019-08-28 DIAGNOSIS — B379 Candidiasis, unspecified: Secondary | ICD-10-CM

## 2019-08-28 DIAGNOSIS — E782 Mixed hyperlipidemia: Secondary | ICD-10-CM

## 2019-08-28 DIAGNOSIS — I10 Essential (primary) hypertension: Secondary | ICD-10-CM

## 2019-08-28 DIAGNOSIS — E119 Type 2 diabetes mellitus without complications: Secondary | ICD-10-CM

## 2019-08-28 DIAGNOSIS — R7309 Other abnormal glucose: Secondary | ICD-10-CM

## 2019-08-28 DIAGNOSIS — N390 Urinary tract infection, site not specified: Secondary | ICD-10-CM

## 2019-08-28 DIAGNOSIS — R8290 Unspecified abnormal findings in urine: Secondary | ICD-10-CM

## 2019-08-28 DIAGNOSIS — R7303 Prediabetes: Secondary | ICD-10-CM

## 2019-08-28 DIAGNOSIS — Z09 Encounter for follow-up examination after completed treatment for conditions other than malignant neoplasm: Secondary | ICD-10-CM

## 2019-08-28 LAB — GLUCOSE, POCT (MANUAL RESULT ENTRY): POC Glucose: 202 mg/dl — AB (ref 70–99)

## 2019-08-28 LAB — POCT GLYCOSYLATED HEMOGLOBIN (HGB A1C): Hemoglobin A1C: 9 % — AB (ref 4.0–5.6)

## 2019-08-28 MED ORDER — FLUCONAZOLE 150 MG PO TABS: 150.0000 mg | ORAL_TABLET | Freq: Once | ORAL | 0 refills | Status: AC

## 2019-08-28 NOTE — Progress Notes (Signed)
Patient [facility]   Established Patient Office Visit  Subjective:  Patient ID: Kimberly Herman, female    DOB: 04/06/1966  Age: 53 y.o. MRN: [id number]  CC:  Chief Complaint  Patient presents with  . Follow-up    Med refill,   . Abdominal Pain    nausea and burning after meals in stomach    HPI Kimberly Herman is a 53 year old female who presents for Follow Up today.   Past Medical History:  Diagnosis Date  . Diabetes mellitus without complication ([place])   . Hypertension   . Vitamin D deficiency 08/2019   Current Status: Since her last office visit, she is doing well with no complaints. She is currently not monitoring his blood glucose levels. She denies fatigue, frequent urination, blurred vision, excessive hunger, excessive thirst, weight gain, weight loss, and poor wound healing. She continues to check her feet regularly. She has c/o vaginal yeast X 2 weeks now. She has chronic acid reflux. She denies fevers, chills, recent infections, weight loss, and night sweats. She has not had any headaches, dizziness, and falls. No chest pain, heart palpitations, cough and shortness of breath reported. No reports of GI problems such as nausea, vomiting, diarrhea, and constipation. She has no reports of blood in stools, dysuria and hematuria. No depression or anxiety, and denies suicidal ideations, homicidal ideations, or auditory hallucinations. She denies pain today.   History reviewed. No pertinent surgical history.  Family History  Problem Relation Age of Onset  . Cancer Mother     Social History   Socioeconomic History  . Marital status: Single    Spouse name: Not on file  . Number of children: Not on file  . Years of education: Not on file  . Highest education level: Not on file  Occupational History  . Not on file  Social Needs  . Financial resource strain: Not on file  . Food insecurity    Worry: Not on file    Inability: Not on  file  . Transportation needs    Medical: Not on file    Non-medical: Not on file  Tobacco Use  . Smoking status: Never Smoker  . Smokeless tobacco: Never Used  Substance and Sexual Activity  . Alcohol use: Yes  . Drug use: No  . Sexual activity: Not on file  Lifestyle  . Physical activity    Days per week: Not on file    Minutes per session: Not on file  . Stress: Not on file  Relationships  . Social Herbalist on phone: Not on file    Gets together: Not on file    Attends religious service: Not on file    Active member of club or organization: Not on file    Attends meetings of clubs or organizations: Not on file    Relationship status: Not on file  . Intimate partner violence    Fear of current or ex partner: Not on file    Emotionally abused: Not on file    Physically abused: Not on file    Forced sexual activity: Not on file  Other Topics Concern  . Not on file  Social History Narrative   Patient recently lost youngest son he was killed in a motor cycle accident at age 36. June 2018    Outpatient Medications Prior to Visit  Medication Sig Dispense Refill  . Blood Pressure Monitoring (BLOOD PRESSURE CUFF) MISC  Check blood pressure once daily at the same time and record readings 1 each 0  . glucose blood (TRUE METRIX BLOOD GLUCOSE TEST) test strip TEST UP TO FOUR TIMES DAILY (Patient not taking: Reported on 09/20/2018) 100 each 0  . ondansetron (ZOFRAN ODT) 4 MG disintegrating tablet Take 1 tablet (4 mg total) by mouth every 8 (eight) hours as needed. (Patient not taking: Reported on 08/28/2019) 10 tablet 0  . potassium chloride SA (K-DUR,KLOR-CON) 20 MEQ tablet Take 1 tablet (20 mEq total) by mouth daily. (Patient not taking: Reported on 08/28/2019) 3 tablet 0  . amLODipine (NORVASC) 5 MG tablet Take 1.5 tablets (7.5 mg total) by mouth daily. (Patient not taking: Reported on 08/28/2019) 135 tablet 1  . atorvastatin (LIPITOR) 40 MG tablet Take 1 tablet (40 mg total)  by mouth daily. (Patient not taking: Reported on 08/28/2019) 90 tablet 3  . blood glucose meter kit and supplies KIT Dispense based on patient and insurance preference. Use up to four times daily as directed. (FOR ICD-9 250.00, 250.01). 1 each 0  . chlorthalidone (HYGROTON) 25 MG tablet Take 1 tablet (25 mg total) by mouth daily. (Patient not taking: Reported on 08/28/2019) 90 tablet 1  . cyclobenzaprine (FLEXERIL) 10 MG tablet Take 1 tablet (10 mg total) by mouth 3 (three) times daily as needed for muscle spasms. (Patient not taking: Reported on 08/28/2019) 30 tablet 3  . esomeprazole (NEXIUM) 20 MG capsule Take 1 capsule (20 mg total) by mouth daily at 12 noon. (Patient not taking: Reported on 08/28/2019) 90 capsule 1  . insulin aspart (NOVOLOG) 100 UNIT/ML injection Inject 10 Units into the skin as needed for high blood sugar (blood sugar >240). (Patient not taking: Reported on 08/28/2019) 10 mL 6  . Insulin Glargine (LANTUS SOLOSTAR) 100 UNIT/ML Solostar Pen Inject 20 Units into the skin daily at 10 pm. (Patient not taking: Reported on 08/28/2019) 15 mL 6  . losartan (COZAAR) 100 MG tablet Take 1 tablet (100 mg total) by mouth daily. (Patient not taking: Reported on 08/28/2019) 90 tablet 1  . metFORMIN (GLUCOPHAGE) 500 MG tablet TAKE 1 TABLET BY MOUTH 3 TIMES DAILY WITH MEALS. (Patient not taking: Reported on 11/01/2018) 270 tablet 1  . sitaGLIPtin (JANUVIA) 50 MG tablet Take 1 tablet (50 mg total) by mouth daily. (Patient not taking: Reported on 11/01/2018) 90 tablet 1  . sulfamethoxazole-trimethoprim (BACTRIM DS,SEPTRA DS) 800-160 MG tablet Take 1 tablet by mouth 2 (two) times daily. 20 tablet 0   No facility-administered medications prior to visit.     No Known Allergies  ROS Review of Systems  Constitutional: Positive for fatigue.  HENT: Negative.   Eyes: Negative.   Respiratory: Negative.   Cardiovascular: Negative.   Gastrointestinal: Negative.   Endocrine: Negative.   Genitourinary:  Positive for vaginal discharge.  Musculoskeletal: Negative.   Skin: Negative.   Allergic/Immunologic: Negative.   Neurological: Negative.   Hematological: Negative.   Psychiatric/Behavioral: Negative.       Objective:    Physical Exam  Constitutional: She is oriented to person, place, and time. She appears well-developed and well-nourished.  HENT:  Head: Normocephalic and atraumatic.  Eyes: Conjunctivae are normal.  Neck: Normal range of motion. Neck supple.  Cardiovascular: Normal rate, regular rhythm, normal heart sounds and intact distal pulses.  Pulmonary/Chest: Effort normal and breath sounds normal.  Abdominal: Soft. Bowel sounds are normal.  Musculoskeletal: Normal range of motion.  Neurological: She is alert and oriented to person, place, and time. She has  normal reflexes.  Skin: Skin is warm and dry.  Psychiatric: She has a normal mood and affect. Her behavior is normal. Judgment and thought content normal.  Nursing note and vitals reviewed.   BP (!) 148/77 (BP Location: Left Arm, Patient Position: Sitting, Cuff Size: Normal) Comment: pt takes meds in the pm  Pulse 74   Temp 98.5 F (36.9 C) (Oral)   Ht '5\' 8"'  (1.727 m)   Wt 202 lb (91.6 kg)   SpO2 100%   BMI 30.71 kg/m  Wt Readings from Last 3 Encounters:  08/28/19 202 lb (91.6 kg)  12/15/18 203 lb (92.1 kg)  11/01/18 203 lb (92.1 kg)     Health Maintenance Due  Topic Date Due  . Hepatitis C Screening  20-Mar-1966  . PNEUMOCOCCAL POLYSACCHARIDE VACCINE AGE 16-64 HIGH RISK  04/02/1966  . OPHTHALMOLOGY EXAM  04/02/1974  . HIV Screening  04/03/1979  . PAP SMEAR-Modifier  04/02/1985  . MAMMOGRAM  04/02/2014  . COLONOSCOPY  04/02/2014  . FOOT EXAM  12/09/2018  . INFLUENZA VACCINE  06/23/2019    There are no preventive care reminders to display for this patient.  Lab Results  Component Value Date   TSH 3.880 08/29/2019   Lab Results  Component Value Date   WBC 5.5 08/29/2019   HGB 12.3 08/29/2019    HCT 36.0 08/29/2019   MCV 86 08/29/2019   PLT 225 08/29/2019   Lab Results  Component Value Date   NA 137 08/29/2019   K 3.9 08/29/2019   CO2 25 08/29/2019   GLUCOSE 239 (H) 08/29/2019   BUN 10 08/29/2019   CREATININE 1.03 (H) 08/29/2019   BILITOT 0.6 08/29/2019   ALKPHOS 117 08/29/2019   AST 24 08/29/2019   ALT 31 08/29/2019   PROT 7.7 08/29/2019   ALBUMIN 4.3 08/29/2019   CALCIUM 9.3 08/29/2019   ANIONGAP 11 12/15/2018   Lab Results  Component Value Date   CHOL 199 08/29/2019   Lab Results  Component Value Date   HDL 43 08/29/2019   Lab Results  Component Value Date   LDLCALC 128 (H) 08/29/2019   Lab Results  Component Value Date   TRIG 155 (H) 08/29/2019   Lab Results  Component Value Date   CHOLHDL 4.6 (H) 08/29/2019   Lab Results  Component Value Date   HGBA1C 9.0 (A) 08/28/2019      Assessment & Plan:   1. Type 2 diabetes mellitus without complication, without long-term current use of insulin (H[facility]) - POCT urinalysis dipstick - POCT glucose (manual entry) - POCT glycosylated hemoglobin (Hb A1C)  2. Hemoglobin A1C between 7% and  9%, indicating poor diabetic control Hgb A1c increased to 9.0 today, from 8.5 on 11/02/2019. She will continue medication as prescribed, to decrease foods/beverages high in sugars and carbs and follow Heart Healthy or DASH diet. Increase physical activity to at least 30 minutes cardio exercise daily.    3. Elevated glucose  4. Essential hypertension The current medical regimen is effective; blood pressure is stable at 148/77 today; continue present plan and medications as prescribed. he will continue to take medications as prescribed, to decrease high sodium intake, excessive alcohol intake, increase potassium intake, smoking cessation, and increase physical activity of at least 30 minutes of cardio activity daily. She will continue to follow Heart Healthy or DASH diet. - Urinalysis  5. Mixed hyperlipidemia  6. Urinary  tract infection with hematuria, site unspecified  7. Yeast infection We will initiate Fluconazole today.  -  fluconazole (DIFLUCAN) 150 MG tablet; Take 1 tablet (150 mg total) by mouth once for 1 dose. (Take additional tablet if symptoms are not relieved)  Dispense: 2 tablet; Refill: 0  8. Abnormal urinalysis Results are pending. - Urine Culture  9. Follow up Follow up in 2 months for office visit.  Follow up in 1 day for labs only.   Meds ordered this encounter  Medications  . fluconazole (DIFLUCAN) 150 MG tablet    Sig: Take 1 tablet (150 mg total) by mouth once for 1 dose. (Take additional tablet if symptoms are not relieved)    Dispense:  2 tablet    Refill:  0    Orders Placed This Encounter  Procedures  . Urine Culture  . Urinalysis  . POCT urinalysis dipstick  . POCT glucose (manual entry)  . POCT glycosylated hemoglobin (Hb A1C)    Referral Orders  No referral(s) requested today    [name],  MSN, FNP-BC [address], [place] [address] [phone] fax   Problem List Items Addressed This Visit      Cardiovascular and Mediastinum   HTN (hypertension)   Relevant Orders   Urinalysis (Completed)     Endocrine   T2DM (type 2 diabetes mellitus) ([place]) - Primary   Relevant Orders   POCT urinalysis dipstick   POCT glucose (manual entry) (Completed)   POCT glycosylated hemoglobin (Hb A1C) (Completed)     Other   Hyperlipidemia    Other Visit Diagnoses    Hemoglobin A1C greater than 9%, indicating poor diabetic control       Elevated glucose       Urinary tract infection with hematuria, site unspecified       Yeast infection       Abnormal urinalysis       Relevant Orders   Urine Culture   Follow up          Meds ordered this encounter  Medications  . fluconazole (DIFLUCAN) 150 MG tablet    Sig: Take 1 tablet (150 mg total) by mouth once  for 1 dose. (Take additional tablet if symptoms are not relieved)    Dispense:  2 tablet    Refill:  0    Follow-up: No follow-ups on file.    Azzie Glatter, FNP

## 2019-08-28 NOTE — Patient Instructions (Signed)

## 2019-08-29 ENCOUNTER — Other Ambulatory Visit: Payer: Self-pay | Admitting: Family Medicine

## 2019-08-29 ENCOUNTER — Other Ambulatory Visit: Payer: Self-pay

## 2019-08-29 DIAGNOSIS — I10 Essential (primary) hypertension: Secondary | ICD-10-CM

## 2019-08-29 DIAGNOSIS — Z794 Long term (current) use of insulin: Secondary | ICD-10-CM

## 2019-08-29 DIAGNOSIS — E119 Type 2 diabetes mellitus without complications: Secondary | ICD-10-CM

## 2019-08-29 DIAGNOSIS — R319 Hematuria, unspecified: Secondary | ICD-10-CM

## 2019-08-29 DIAGNOSIS — N390 Urinary tract infection, site not specified: Secondary | ICD-10-CM

## 2019-08-29 LAB — URINALYSIS
Bilirubin, UA: NEGATIVE
Ketones, UA: NEGATIVE
Nitrite, UA: NEGATIVE
Specific Gravity, UA: 1.016 (ref 1.005–1.030)
Urobilinogen, Ur: 0.2 mg/dL (ref 0.2–1.0)
pH, UA: 5.5 (ref 5.0–7.5)

## 2019-08-29 MED ORDER — ESOMEPRAZOLE MAGNESIUM 20 MG PO CPDR: 20.0000 mg | DELAYED_RELEASE_CAPSULE | Freq: Every day | ORAL | 0 refills | Status: DC

## 2019-08-29 MED ORDER — LOSARTAN POTASSIUM 100 MG PO TABS: 100.0000 mg | ORAL_TABLET | Freq: Every day | ORAL | 0 refills | Status: DC

## 2019-08-29 MED ORDER — INSULIN ASPART 100 UNIT/ML ~~LOC~~ SOLN: 10.0000 [IU] | SUBCUTANEOUS | 3 refills | Status: DC | PRN

## 2019-08-29 MED ORDER — SITAGLIPTIN PHOSPHATE 50 MG PO TABS: 50.0000 mg | ORAL_TABLET | Freq: Every day | ORAL | 0 refills | Status: DC

## 2019-08-29 MED ORDER — AMLODIPINE BESYLATE 5 MG PO TABS: 7.5000 mg | ORAL_TABLET | Freq: Every day | ORAL | 0 refills | Status: DC

## 2019-08-29 MED ORDER — NITROFURANTOIN MONOHYD MACRO 100 MG PO CAPS: 100.0000 mg | ORAL_CAPSULE | Freq: Two times a day (BID) | ORAL | 0 refills | Status: AC

## 2019-08-29 MED ORDER — ATORVASTATIN CALCIUM 40 MG PO TABS: 40.0000 mg | ORAL_TABLET | Freq: Every day | ORAL | 0 refills | Status: DC

## 2019-08-29 MED ORDER — BLOOD GLUCOSE MONITOR KIT: PACK | 0 refills | Status: AC

## 2019-08-29 MED ORDER — LANTUS SOLOSTAR 100 UNIT/ML ~~LOC~~ SOPN: 20.0000 [IU] | PEN_INJECTOR | Freq: Every day | SUBCUTANEOUS | 3 refills | Status: DC

## 2019-08-29 MED ORDER — CHLORTHALIDONE 25 MG PO TABS: 25.0000 mg | ORAL_TABLET | Freq: Every day | ORAL | 0 refills | Status: DC

## 2019-08-29 MED ORDER — METFORMIN HCL 500 MG PO TABS: ORAL_TABLET | ORAL | 0 refills | Status: DC

## 2019-08-29 MED FILL — NITROFURANTOIN MONO-MCR 100: 100 | 10 days supply | Qty: 20 | Fill #0

## 2019-08-29 MED FILL — ?CHLORTHALIDONE 25 MG TABLE: 25 | 30 days supply | Qty: 30 | Fill #0

## 2019-08-29 MED FILL — ?BASAGLAR 100 UNITS/ML KWPE: 100 | 30 days supply | Qty: 6 | Fill #0

## 2019-08-29 MED FILL — ?METFORMIN HCL 500MG TABLET: 500 | 30 days supply | Qty: 90 | Fill #0

## 2019-08-29 MED FILL — LOSARTAN POTASSIUM 100 MG T: 100 | 30 days supply | Qty: 30 | Fill #0

## 2019-08-29 MED FILL — ?ATORVASTATIN 40MG TABLET: 40 | 30 days supply | Qty: 30 | Fill #0

## 2019-08-29 MED FILL — ?ESOMEPRAZOLE MAG DR 20MG C: 20 | 30 days supply | Qty: 30 | Fill #0

## 2019-08-29 MED FILL — ?AMLODIPINE BESYLATE 5MG TA: 5 | 30 days supply | Qty: 45 | Fill #0

## 2019-08-29 MED FILL — $JANUVIA 50 MG TABLET: 50 | 90 days supply | Qty: 90 | Fill #0

## 2019-08-30 ENCOUNTER — Encounter: Payer: Self-pay | Admitting: Family Medicine

## 2019-08-30 ENCOUNTER — Other Ambulatory Visit: Payer: Self-pay | Admitting: Family Medicine

## 2019-08-30 DIAGNOSIS — R7303 Prediabetes: Secondary | ICD-10-CM | POA: Insufficient documentation

## 2019-08-30 DIAGNOSIS — E119 Type 2 diabetes mellitus without complications: Secondary | ICD-10-CM | POA: Insufficient documentation

## 2019-08-30 DIAGNOSIS — R7309 Other abnormal glucose: Secondary | ICD-10-CM | POA: Insufficient documentation

## 2019-08-30 DIAGNOSIS — E559 Vitamin D deficiency, unspecified: Secondary | ICD-10-CM

## 2019-08-30 LAB — COMPREHENSIVE METABOLIC PANEL
ALT: 31 IU/L (ref 0–32)
AST: 24 IU/L (ref 0–40)
Albumin/Globulin Ratio: 1.3 (ref 1.2–2.2)
Albumin: 4.3 g/dL (ref 3.8–4.9)
Alkaline Phosphatase: 117 IU/L (ref 39–117)
BUN/Creatinine Ratio: 10 (ref 9–23)
BUN: 10 mg/dL (ref 6–24)
Bilirubin Total: 0.6 mg/dL (ref 0.0–1.2)
CO2: 25 mmol/L (ref 20–29)
Calcium: 9.3 mg/dL (ref 8.7–10.2)
Chloride: 98 mmol/L (ref 96–106)
Creatinine, Ser: 1.03 mg/dL — ABNORMAL HIGH (ref 0.57–1.00)
GFR calc Af Amer: 71 mL/min/{1.73_m2} (ref >59)
GFR calc non Af Amer: 61 mL/min/{1.73_m2} (ref >59)
Globulin, Total: 3.4 g/dL (ref 1.5–4.5)
Glucose: 239 mg/dL — ABNORMAL HIGH (ref 65–99)
Potassium: 3.9 mmol/L (ref 3.5–5.2)
Sodium: 137 mmol/L (ref 134–144)
Total Protein: 7.7 g/dL (ref 6.0–8.5)

## 2019-08-30 LAB — CBC WITH DIFFERENTIAL/PLATELET
Basophils Absolute: 0.1 10*3/uL (ref 0.0–0.2)
Basos: 1 %
EOS (ABSOLUTE): 0.2 10*3/uL (ref 0.0–0.4)
Eos: 3 %
Hematocrit: 36 % (ref 34.0–46.6)
Hemoglobin: 12.3 g/dL (ref 11.1–15.9)
Immature Grans (Abs): 0 10*3/uL (ref 0.0–0.1)
Immature Granulocytes: 0 %
Lymphocytes Absolute: 1.9 10*3/uL (ref 0.7–3.1)
Lymphs: 34 %
MCH: 29.4 pg (ref 26.6–33.0)
MCHC: 34.2 g/dL (ref 31.5–35.7)
MCV: 86 fL (ref 79–97)
Monocytes Absolute: 0.4 10*3/uL (ref 0.1–0.9)
Monocytes: 6 %
Neutrophils Absolute: 3.1 10*3/uL (ref 1.4–7.0)
Neutrophils: 56 %
Platelets: 225 10*3/uL (ref 150–450)
RBC: 4.19 x10E6/uL (ref 3.77–5.28)
RDW: 11.8 % (ref 11.7–15.4)
WBC: 5.5 10*3/uL (ref 3.4–10.8)

## 2019-08-30 LAB — LIPID PANEL
Chol/HDL Ratio: 4.6 ratio — ABNORMAL HIGH (ref 0.0–4.4)
Cholesterol, Total: 199 mg/dL (ref 100–199)
HDL: 43 mg/dL (ref >39)
LDL Chol Calc (NIH): 128 mg/dL — ABNORMAL HIGH (ref 0–99)
Triglycerides: 155 mg/dL — ABNORMAL HIGH (ref 0–149)
VLDL Cholesterol Cal: 28 mg/dL (ref 5–40)

## 2019-08-30 LAB — VITAMIN D 25 HYDROXY (VIT D DEFICIENCY, FRACTURES): Vit D, 25-Hydroxy: 17.8 ng/mL — ABNORMAL LOW (ref 30.0–100.0)

## 2019-08-30 LAB — VITAMIN B12: Vitamin B-12: 814 pg/mL (ref 232–1245)

## 2019-08-30 LAB — TSH: TSH: 3.88 u[IU]/mL (ref 0.450–4.500)

## 2019-08-30 MED ORDER — VITAMIN D (ERGOCALCIFEROL) 1.25 MG (50000 UNIT) PO CAPS: 50000.0000 [IU] | ORAL_CAPSULE | ORAL | 3 refills | Status: DC

## 2019-08-30 MED FILL — VIT D2 1.25 MG (50,000 UNIT: 1.25 MG | 84 days supply | Qty: 12 | Fill #0

## 2019-09-02 LAB — URINE CULTURE

## 2019-09-28 ENCOUNTER — Other Ambulatory Visit: Payer: Self-pay | Admitting: Family Medicine

## 2019-09-28 DIAGNOSIS — Z794 Long term (current) use of insulin: Secondary | ICD-10-CM

## 2019-09-28 DIAGNOSIS — E119 Type 2 diabetes mellitus without complications: Secondary | ICD-10-CM

## 2019-09-28 MED FILL — ?ATORVASTATIN 40MG TABLET: 40 | 30 days supply | Qty: 30 | Fill #1

## 2019-09-28 MED FILL — LOSARTAN POTASSIUM 100 MG T: 100 | 30 days supply | Qty: 30 | Fill #1

## 2019-09-28 MED FILL — ?CHLORTHALIDONE 25 MG TABLE: 25 | 30 days supply | Qty: 30 | Fill #1

## 2019-10-23 DIAGNOSIS — N842 Polyp of vagina: Secondary | ICD-10-CM

## 2019-10-23 HISTORY — DX: Polyp of vagina: N84.2

## 2019-10-29 ENCOUNTER — Ambulatory Visit (INDEPENDENT_AMBULATORY_CARE_PROVIDER_SITE_OTHER): Payer: Self-pay | Admitting: Family Medicine

## 2019-10-29 ENCOUNTER — Other Ambulatory Visit: Payer: Self-pay

## 2019-10-29 ENCOUNTER — Encounter: Payer: Self-pay | Admitting: Family Medicine

## 2019-10-29 VITALS — BP 131/78 | HR 73 | Temp 97.9°F | Ht 68.0 in | Wt 198.6 lb

## 2019-10-29 DIAGNOSIS — E119 Type 2 diabetes mellitus without complications: Secondary | ICD-10-CM

## 2019-10-29 DIAGNOSIS — R739 Hyperglycemia, unspecified: Secondary | ICD-10-CM

## 2019-10-29 DIAGNOSIS — N842 Polyp of vagina: Secondary | ICD-10-CM

## 2019-10-29 DIAGNOSIS — Z09 Encounter for follow-up examination after completed treatment for conditions other than malignant neoplasm: Secondary | ICD-10-CM

## 2019-10-29 DIAGNOSIS — Z124 Encounter for screening for malignant neoplasm of cervix: Secondary | ICD-10-CM

## 2019-10-29 DIAGNOSIS — Z794 Long term (current) use of insulin: Secondary | ICD-10-CM

## 2019-10-29 DIAGNOSIS — R8290 Unspecified abnormal findings in urine: Secondary | ICD-10-CM

## 2019-10-29 DIAGNOSIS — Z01419 Encounter for gynecological examination (general) (routine) without abnormal findings: Secondary | ICD-10-CM

## 2019-10-29 LAB — GLUCOSE, POCT (MANUAL RESULT ENTRY): POC Glucose: 127 mg/dl — AB (ref 70–99)

## 2019-10-29 MED ORDER — SITAGLIPTIN PHOSPHATE 50 MG PO TABS: ORAL_TABLET | ORAL | 6 refills | Status: DC

## 2019-10-29 MED FILL — JANUVIA 50 MG TABLET: 50 | 30 days supply | Qty: 30 | Fill #0

## 2019-10-29 NOTE — Progress Notes (Signed)
Patient [facility]   Pap Smear  Subjective:  Patient ID: Kimberly Herman, female    DOB: October 02, 1966  Age: 53 y.o. MRN: [id number]  CC:  Chief Complaint  Patient presents with  . Follow-up    DM, UA & Pap    HPI Kimberly Herman is a 53 year old female who presents for Pap Smear today.   Past Medical History:  Diagnosis Date  . Diabetes mellitus without complication ([place])   . Hypertension   . Menopause 2015  . Vitamin D deficiency 08/2019   Current Status: Patient here for routine gynecological exam.  She has previously had Pap smear examinations.  Patient states that she has never had an abnormal Pap smear.  She is sexually active.  She denies abnormal vaginal discharge, vaginal  itching, vaginal burning, or dyspareunia.  Patient states that she does not perform monthly self breast exams. She does not have any family history of cancer that she knows of. She typically follows a balanced diet but does not exercise routinely. Body mass index is 30.20.  Gynecologic History Patient's last menstrual period was 2015.  History reviewed. No pertinent surgical history.  Family History  Problem Relation Age of Onset  . Cancer Mother     Social History   Socioeconomic History  . Marital status: Single    Spouse name: Not on file  . Number of children: Not on file  . Years of education: Not on file  . Highest education level: Not on file  Occupational History  . Not on file  Social Needs  . Financial resource strain: Not on file  . Food insecurity    Worry: Not on file    Inability: Not on file  . Transportation needs    Medical: Not on file    Non-medical: Not on file  Tobacco Use  . Smoking status: Never Smoker  . Smokeless tobacco: Never Used  Substance and Sexual Activity  . Alcohol use: Not Currently  . Drug use: No  . Sexual activity: Yes  Lifestyle  . Physical activity    Days per week: Not on file    Minutes per  session: Not on file  . Stress: Not on file  Relationships  . Social Herbalist on phone: Not on file    Gets together: Not on file    Attends religious service: Not on file    Active member of club or organization: Not on file    Attends meetings of clubs or organizations: Not on file    Relationship status: Not on file  . Intimate partner violence    Fear of current or ex partner: Not on file    Emotionally abused: Not on file    Physically abused: Not on file    Forced sexual activity: Not on file  Other Topics Concern  . Not on file  Social History Narrative   Patient recently lost youngest son he was killed in a motor cycle accident at age 38. June 2018    Outpatient Medications Prior to Visit  Medication Sig Dispense Refill  . amLODipine (NORVASC) 5 MG tablet Take 1.5 tablets (7.5 mg total) by mouth daily. 135 tablet 0  . atorvastatin (LIPITOR) 40 MG tablet Take 1 tablet (40 mg total) by mouth daily. 90 tablet 0  . blood glucose meter kit and supplies KIT Dispense based on patient and insurance preference. Use up to four times  daily as directed. (FOR ICD-9 250.00, 250.01). 1 each 0  . Blood Pressure Monitoring (BLOOD PRESSURE CUFF) MISC Check blood pressure once daily at the same time and record readings 1 each 0  . chlorthalidone (HYGROTON) 25 MG tablet Take 1 tablet (25 mg total) by mouth daily. 90 tablet 0  . esomeprazole (NEXIUM) 20 MG capsule Take 1 capsule (20 mg total) by mouth daily at 12 noon. 90 capsule 0  . glucose blood (TRUE METRIX BLOOD GLUCOSE TEST) test strip TEST UP TO FOUR TIMES DAILY (Patient not taking: Reported on 09/20/2018) 100 each 0  . insulin aspart (NOVOLOG) 100 UNIT/ML injection Inject 10 Units into the skin as needed for high blood sugar (blood sugar >240). 10 mL 3  . Insulin Glargine (LANTUS SOLOSTAR) 100 UNIT/ML Solostar Pen Inject 20 Units into the skin daily at 10 pm. 15 mL 3  . losartan (COZAAR) 100 MG tablet Take 1 tablet (100 mg  total) by mouth daily. 90 tablet 0  . metFORMIN (GLUCOPHAGE) 500 MG tablet TAKE 1 TABLET BY MOUTH 3 TIMES DAILY WITH MEALS. 270 tablet 0  . ondansetron (ZOFRAN ODT) 4 MG disintegrating tablet Take 1 tablet (4 mg total) by mouth every 8 (eight) hours as needed. (Patient not taking: Reported on 08/28/2019) 10 tablet 0  . potassium chloride SA (K-DUR,KLOR-CON) 20 MEQ tablet Take 1 tablet (20 mEq total) by mouth daily. (Patient not taking: Reported on 08/28/2019) 3 tablet 0  . Vitamin D, Ergocalciferol, (DRISDOL) 1.25 MG (50000 UT) CAPS capsule Take 1 capsule (50,000 Units total) by mouth every 7 (seven) days. 5 capsule 3  . JANUVIA 50 MG tablet TAKE 1 TABLET (50 MG TOTAL) BY MOUTH DAILY. 90 tablet 0   No facility-administered medications prior to visit.     No Known Allergies  ROS Review of Systems  Constitutional: Negative.   HENT: Negative.   Eyes: Negative.   Respiratory: Negative.   Cardiovascular: Negative.   Gastrointestinal: Negative.   Endocrine: Negative.   Genitourinary: Negative.   Musculoskeletal: Negative.   Skin: Negative.   Allergic/Immunologic: Negative.   Neurological: Negative.   Hematological: Negative.   Psychiatric/Behavioral: Negative.       Objective:    Physical Exam  Constitutional: She is oriented to person, place, and time. She appears well-developed and well-nourished.  HENT:  Head: Normocephalic and atraumatic.  Eyes: Conjunctivae are normal.  Neck: Normal range of motion. Neck supple.  Cardiovascular: Normal rate, normal heart sounds and intact distal pulses.  Pulmonary/Chest: Effort normal and breath sounds normal.  Abdominal: Soft. Bowel sounds are normal. She exhibits distension.  Genitourinary:     Musculoskeletal: Normal range of motion.  Neurological: She is alert and oriented to person, place, and time. She has normal reflexes.  Skin: Skin is warm and dry.  Psychiatric: She has a normal mood and affect. Her behavior is normal. Judgment  and thought content normal.  Nursing note and vitals reviewed.   BP 131/78   Pulse 73   Temp 97.9 F (36.6 C) (Oral)   Ht _0  (1.727 m)   Wt 198 lb 9.6 oz (90.1 kg)   SpO2 97%   BMI 30.20 kg/m  Wt Readings from Last 3 Encounters:  10/29/19 198 lb 9.6 oz (90.1 kg)  08/28/19 202 lb (91.6 kg)  12/15/18 203 lb (92.1 kg)     Health Maintenance Due  Topic Date Due  . Hepatitis C Screening  05-04-1966  . PNEUMOCOCCAL POLYSACCHARIDE VACCINE AGE 71-64 HIGH RISK  04/02/1966  . OPHTHALMOLOGY EXAM  04/02/1974  . HIV Screening  04/03/1979  . PAP SMEAR-Modifier  04/02/1985  . MAMMOGRAM  04/02/2014  . COLONOSCOPY  04/02/2014  . FOOT EXAM  12/09/2018  . INFLUENZA VACCINE  06/23/2019    There are no preventive care reminders to display for this patient.  Lab Results  Component Value Date   TSH 3.880 08/29/2019   Lab Results  Component Value Date   WBC 5.5 08/29/2019   HGB 12.3 08/29/2019   HCT 36.0 08/29/2019   MCV 86 08/29/2019   PLT 225 08/29/2019   Lab Results  Component Value Date   NA 137 08/29/2019   K 3.9 08/29/2019   CO2 25 08/29/2019   GLUCOSE 239 (H) 08/29/2019   BUN 10 08/29/2019   CREATININE 1.03 (H) 08/29/2019   BILITOT 0.6 08/29/2019   ALKPHOS 117 08/29/2019   AST 24 08/29/2019   ALT 31 08/29/2019   PROT 7.7 08/29/2019   ALBUMIN 4.3 08/29/2019   CALCIUM 9.3 08/29/2019   ANIONGAP 11 12/15/2018   Lab Results  Component Value Date   CHOL 199 08/29/2019   Lab Results  Component Value Date   HDL 43 08/29/2019   Lab Results  Component Value Date   LDLCALC 128 (H) 08/29/2019   Lab Results  Component Value Date   TRIG 155 (H) 08/29/2019   Lab Results  Component Value Date   CHOLHDL 4.6 (H) 08/29/2019   Lab Results  Component Value Date   HGBA1C 9.0 (A) 08/28/2019      Assessment & Plan:   1. Encounter for gynecological examination Patient tolerated exam well with no complaints. Pap Smear results are pending.  - Pap IG w/ reflex to  HPV when ASC-U ([facility])  2. Encounter for gynecological examination with Papanicolaou smear of cervix Follow-up for scheduled mammogram Recommend monthly self breast exam Recommend daily multivitamin for women Recommend strength training in 150 minutes of cardiovascular exercise per week.  3. Cervical cancer screening - Pap IG w/ reflex to HPV when ASC-U ([facility])  4. Vaginal polyp We will refer her to GYN for further assessment.   5. Type 2 diabetes mellitus without complication, without long-term current use of insulin ([facility]) - Urinalysis Dipstick - Glucose (CBG) - Urine Culture  5. Abnormal urinalysis Results are pending.  - Urine Culture  6. Follow up She will follow up in 1 month for CBG and Hgb A1c lab only. She will follow up in 6 months for office visit.     Problem List Items Addressed This Visit      Endocrine   T2DM (type 2 diabetes mellitus) ([place])   Relevant Medications   sitaGLIPtin (JANUVIA) 50 MG tablet   Other Relevant Orders   Urinalysis Dipstick   Glucose (CBG) (Completed)   Urine Culture     Other   Hyperglycemia    Other Visit Diagnoses    Encounter for gynecological examination with Papanicolaou smear of cervix    -  Primary   Cervical cancer screening       Relevant Orders   Pap IG w/ reflex to HPV when ASC-U ([facility])   Vaginal polyp       Relevant Orders   Ambulatory referral to Obstetrics / Gynecology   Abnormal urinalysis       Relevant Orders   Urine Culture   Follow up          Meds ordered this encounter  Medications  . sitaGLIPtin (JANUVIA)  50 MG tablet    Sig: TAKE 1 TABLET (50 MG TOTAL) BY MOUTH DAILY.    Dispense:  30 tablet    Refill:  6    Follow-up: Return in about 1 month (around 11/29/2019).    Azzie Glatter, FNP

## 2019-10-30 ENCOUNTER — Encounter: Payer: Self-pay | Admitting: Family Medicine

## 2019-10-30 DIAGNOSIS — Z01419 Encounter for gynecological examination (general) (routine) without abnormal findings: Secondary | ICD-10-CM | POA: Insufficient documentation

## 2019-10-30 DIAGNOSIS — N842 Polyp of vagina: Secondary | ICD-10-CM | POA: Insufficient documentation

## 2019-10-31 LAB — PAP IG W/ RFLX HPV ASCU

## 2019-11-03 LAB — URINE CULTURE

## 2019-11-04 ENCOUNTER — Other Ambulatory Visit: Payer: Self-pay | Admitting: Family Medicine

## 2019-11-04 DIAGNOSIS — N390 Urinary tract infection, site not specified: Secondary | ICD-10-CM

## 2019-11-04 MED ORDER — CIPROFLOXACIN HCL 500 MG PO TABS: 500.0000 mg | ORAL_TABLET | Freq: Two times a day (BID) | ORAL | 0 refills | Status: AC

## 2019-11-05 MED FILL — CIPROFLOXACIN HCL 500 MG TA: 500 | 7 days supply | Qty: 14 | Fill #0

## 2019-11-05 NOTE — Progress Notes (Signed)
Message left for call back 

## 2019-11-05 NOTE — Progress Notes (Signed)
MESSAGE LEFT FOR CALL BACK

## 2019-11-13 MED FILL — ?METFORMIN HCL 500MG TABLET: 500 | 30 days supply | Qty: 90 | Fill #1

## 2019-11-13 MED FILL — JANUVIA 50 MG TABLET: 50 | 30 days supply | Qty: 30 | Fill #0

## 2019-11-13 MED FILL — ?ATORVASTATIN 40MG TABLET: 40 | 30 days supply | Qty: 30 | Fill #2

## 2019-11-13 MED FILL — ?CHLORTHALIDONE 25 MG TABLE: 25 | 30 days supply | Qty: 30 | Fill #2

## 2019-11-13 MED FILL — LOSARTAN POTASSIUM 100 MG T: 100 | 30 days supply | Qty: 30 | Fill #2

## 2019-12-24 ENCOUNTER — Other Ambulatory Visit: Payer: Self-pay | Admitting: Obstetrics and Gynecology

## 2020-01-11 ENCOUNTER — Other Ambulatory Visit: Payer: Self-pay | Admitting: Family Medicine

## 2020-01-11 DIAGNOSIS — I10 Essential (primary) hypertension: Secondary | ICD-10-CM

## 2020-01-11 MED FILL — ?ATORVASTATIN 40MG TABLET: 40 | 30 days supply | Qty: 30 | Fill #0

## 2020-01-11 MED FILL — ?CHLORTHALIDONE 25MG TABL: 25 | 30 days supply | Qty: 30 | Fill #0

## 2020-01-11 MED FILL — JANUVIA 50 MG TABLET: 50 | 30 days supply | Qty: 30 | Fill #1

## 2020-01-11 MED FILL — LOSARTAN POTASSIUM 100 MG T: 100 | 30 days supply | Qty: 30 | Fill #0

## 2020-01-25 MED FILL — ?METFORMIN HCL 500MG TABLET: 500 | 30 days supply | Qty: 90 | Fill #2

## 2020-02-11 MED FILL — ?CHLORTHALIDONE 25MG TABL: 25 | 30 days supply | Qty: 30 | Fill #0

## 2020-02-20 MED FILL — ?CHLORTHALIDONE 25MG TABL: 25 | 30 days supply | Qty: 30 | Fill #0

## 2020-03-17 NOTE — Progress Notes (Addendum)
Cardiology Office Note:    Date:  03/18/2020   ID:  Kimberly Herman, DOB 02/23/1966, MRN [id number]  PCP:  Azzie Glatter, FNP  Cardiologist:  Quay Burow, MD   Referring MD: Azzie Glatter, FNP   Chief Complaint  Patient presents with  . Follow-up    hypertension    History of Present Illness:    Kimberly Herman is a 54 y.o. female with a hx of HTN, DM, and mild diastolic dysfunction. She saw Dr. Gwenlyn Herman in Feb 2019 with complaints of dyspnea. Stress test was without reversible ischemia and echo was essentially normal with exception of mild diastolic dysfunction. Dr. Gwenlyn Herman relayed that her dyspnea is likely not related to a cardiovascular process.   She returns today for follow up. Overall, doing very well. She is fully vaccinated against COVID-19. No chest pain, SOB, palpitations, or syncope. She is frustrated with the lower extremity swelling associated with amlodipine. BP is not at goal. She also complains of left shoulder blade pain tender to touch, no rash present, worse with deep inspiration.  Her husband was in a car accident that lead to a leg amputation. She is now his primary care giver.   Past Medical History:  Diagnosis Date  . Diabetes mellitus without complication ([place])   . Hypertension   . Menopause 2015  . Vaginal polyp 10/2019  . Vitamin D deficiency 08/2019    History reviewed. No pertinent surgical history.  Current Medications: Current Meds  Medication Sig  . atorvastatin (LIPITOR) 40 MG tablet TAKE 1 TABLET (40 MG TOTAL) BY MOUTH DAILY.  . blood glucose meter kit and supplies KIT Dispense based on patient and insurance preference. Use up to four times daily as directed. (FOR ICD-9 250.00, 250.01).  . Blood Pressure Monitoring (BLOOD PRESSURE CUFF) MISC Check blood pressure once daily at the same time and record readings  . chlorthalidone (HYGROTON) 25 MG tablet TAKE 1 TABLET (25 MG TOTAL) BY MOUTH DAILY.  [name] esomeprazole (NEXIUM) 20 MG capsule Take 1  capsule (20 mg total) by mouth daily at 12 noon.  [name] glucose blood (TRUE METRIX BLOOD GLUCOSE TEST) test strip TEST UP TO FOUR TIMES DAILY  . insulin aspart (NOVOLOG) 100 UNIT/ML injection Inject 10 Units into the skin as needed for high blood sugar (blood sugar >240).  . Insulin Glargine (LANTUS SOLOSTAR) 100 UNIT/ML Solostar Pen Inject 20 Units into the skin daily at 10 pm.  . losartan (COZAAR) 100 MG tablet TAKE 1 TABLET (100 MG TOTAL) BY MOUTH DAILY.  . metFORMIN (GLUCOPHAGE) 500 MG tablet TAKE 1 TABLET BY MOUTH 3 TIMES DAILY WITH MEALS.  [name] ondansetron (ZOFRAN ODT) 4 MG disintegrating tablet Take 1 tablet (4 mg total) by mouth every 8 (eight) hours as needed.  . sitaGLIPtin (JANUVIA) 50 MG tablet TAKE 1 TABLET (50 MG TOTAL) BY MOUTH DAILY.  [name] Vitamin D, Ergocalciferol, (DRISDOL) 1.25 MG (50000 UT) CAPS capsule Take 1 capsule (50,000 Units total) by mouth every 7 (seven) days.  . [DISCONTINUED] amLODipine (NORVASC) 5 MG tablet Take 1.5 tablets (7.5 mg total) by mouth daily.     Allergies:   Patient has no known allergies.   Social History   Socioeconomic History  . Marital status: Single    Spouse name: Not on file  . Number of children: Not on file  . Years of education: Not on file  . Highest education level: Not on file  Occupational History  . Not on file  Tobacco Use  .  Smoking status: Never Smoker  . Smokeless tobacco: Never Used  Substance and Sexual Activity  . Alcohol use: Not Currently  . Drug use: No  . Sexual activity: Yes  Other Topics Concern  . Not on file  Social History Narrative   Patient recently lost youngest son he was killed in a motor cycle accident at age 16. June 2018   Social Determinants of Health   Financial Resource Strain:   . Difficulty of Paying Living Expenses:   Food Insecurity:   . Worried About Charity fundraiser in the Last Year:   . Arboriculturist in the Last Year:   Transportation Needs:   . Film/video editor (Medical):   [name]  Lack of Transportation (Non-Medical):   Physical Activity:   . Days of Exercise per Week:   . Minutes of Exercise per Session:   Stress:   . Feeling of Stress :   Social Connections:   . Frequency of Communication with Friends and Family:   . Frequency of Social Gatherings with Friends and Family:   . Attends Religious Services:   . Active Member of Clubs or Organizations:   . Attends Archivist Meetings:   [name] Marital Status:      Family History: The patient's family history includes Cancer in her mother.  ROS:   Please see the history of present illness.     All other systems reviewed and are negative.  EKGs/Labs/Other Studies Reviewed:    The following studies were reviewed today:  Echo 01/04/18: - Left ventricle: The cavity size was normal. Wall thickness was  increased in a pattern of moderate LVH. Systolic function was  normal. The estimated ejection fraction was in the range of 55%  to 60%. Wall motion was normal; there were no regional wall  motion abnormalities. Doppler parameters are consistent with  abnormal left ventricular relaxation (grade 1 diastolic  dysfunction).  - Left atrium: The atrium was mildly dilated.    Nuclear stress test: 12/30/17:  The left ventricular ejection fraction is normal (55-65%).  Nuclear stress EF: 61%.  Blood pressure demonstrated a hypertensive response to exercise.  There was no ST segment deviation noted during stress.  The study is normal.  This is a low risk study.  There is a small defect in the apical lateral region that is present at stress and rest. Rest images are poor and difficult to interpret. This may reprsent a small area of infarct, but is more likely artifactual.   EKG:  EKG is  ordered today.  The ekg ordered today demonstrates sinus rhythm HR 68  Recent Labs: 08/29/2019: ALT 31; BUN 10; Creatinine, Ser 1.03; Hemoglobin 12.3; Platelets 225; Potassium 3.9; Sodium 137; TSH 3.880  Recent  Lipid Panel    Component Value Date/Time   CHOL 199 08/29/2019 1115   TRIG 155 (H) 08/29/2019 1115   HDL 43 08/29/2019 1115   CHOLHDL 4.6 (H) 08/29/2019 1115   CHOLHDL 4.4 09/07/2017 0933   VLDL 49 (H) 03/04/2017 1543   LDLCALC 128 (H) 08/29/2019 1115   LDLCALC 137 (H) 09/07/2017 0933    Physical Exam:    VS:  BP 140/90   Pulse 68   Ht '5\' 7"'$  (1.702 m)   Wt 198 lb 6.4 oz (90 kg)   BMI 31.07 kg/m     Wt Readings from Last 3 Encounters:  03/18/20 198 lb 6.4 oz (90 kg)  10/29/19 198 lb 9.6 oz (90.1 kg)  08/28/19  202 lb (91.6 kg)     GEN:  Well nourished, well developed in no acute distress HEENT: Normal NECK: No JVD; No carotid bruits LYMPHATICS: No lymphadenopathy CARDIAC: RRR, no murmurs, rubs, gallops RESPIRATORY:  Clear to auscultation without rales, wheezing or rhonchi  ABDOMEN: Soft, non-tender, non-distended MUSCULOSKELETAL:  No edema; No deformity  SKIN: Warm and dry NEUROLOGIC:  Alert and oriented x 3 PSYCHIATRIC:  Normal affect   ASSESSMENT:    1. Essential hypertension   2. Pure hypercholesterolemia   3. Left-sided thoracic back pain, unspecified chronicity    PLAN:    In order of problems listed above:  HTN - currently taking 7.5 mg norvasc, 100 mg losartan, and 25 mg chlorthalidone - BP is not at goal given her DM - she does not like the LE swelling due to norvasc - 7.5 mg - HR in the 60s today, will not initiate BB - given her history of low potassium (3.0 in early 2020, but no longer on a K supplement), I will start spironolactone - start 12.5 mg spiro and keep BP log, if not at goal < 130/80 then increase to 25 mg spiro - 2 weeks later, come in for BMP - I will see her back virtually in 4 weeks - she is prepared to need a small dose of norvasc if not at goal   SOB - no further complaints - normal stress test 12/2017 - echo with mild DD - 30 day event monitor was not completed - no complaints of palpitations   Hyperlipidemia - continue  40 mg lipitor  08/29/2019: Cholesterol, Total 199; HDL 43; LDL Chol Calc (NIH) 128; Triglycerides 155 - discuss at next visit increasing lipitor for goal LDL of less than 100 given her co-morbid conditions    Right shoulder blade pain - worse with palpation and deep breathing - low suspicion for ACS or dissection - follow up with PCP    Medication Adjustments/Labs and Tests Ordered: Current medicines are reviewed at length with the patient today.  Concerns regarding medicines are outlined above.  Orders Placed This Encounter  Procedures  . Basic metabolic panel  . EKG 12-Lead   Meds ordered this encounter  Medications  . spironolactone (ALDACTONE) 25 MG tablet    Sig: Take 1/2 tablet (12.5 mg) by mouth daily for 1 week. If BP not at goal, increase to 1 tablet (25 mg) daily.    Dispense:  90 tablet    Refill:  1    Signed, Ledora Bottcher, [place]  03/18/2020 11:26 AM    [facility]

## 2020-03-18 ENCOUNTER — Ambulatory Visit (INDEPENDENT_AMBULATORY_CARE_PROVIDER_SITE_OTHER): Payer: 59 | Admitting: Physician Assistant

## 2020-03-18 ENCOUNTER — Encounter: Payer: Self-pay | Admitting: Physician Assistant

## 2020-03-18 ENCOUNTER — Other Ambulatory Visit: Payer: Self-pay

## 2020-03-18 VITALS — BP 140/90 | HR 68 | Ht 67.0 in | Wt 198.4 lb

## 2020-03-18 DIAGNOSIS — E7800 Pure hypercholesterolemia, unspecified: Secondary | ICD-10-CM

## 2020-03-18 DIAGNOSIS — I10 Essential (primary) hypertension: Secondary | ICD-10-CM | POA: Diagnosis not present

## 2020-03-18 DIAGNOSIS — M546 Pain in thoracic spine: Secondary | ICD-10-CM

## 2020-03-18 MED ORDER — SPIRONOLACTONE 25 MG PO TABS: ORAL_TABLET | ORAL | 1 refills | Status: DC

## 2020-03-18 MED FILL — ?SPIRONOLACTONE 25 MG TABLE: 25 | 30 days supply | Qty: 30 | Fill #0

## 2020-03-18 NOTE — Patient Instructions (Signed)
Medication Instructions:   STOP Amlodipine  START Spironolactone 25 mg---take 1/2 tab (12.5 mg) daily for one week. If BP not at goal of less than 130/80, then increase to 1 full tablet (25 mg) daily.  *If you need a refill on your cardiac medications before your next appointment, please call your pharmacy*   Lab Work: Your physician recommends that you return for lab work in 2 weeks: BMET  If you have labs (blood work) drawn today and your tests are completely normal, you will receive your results only by: [name] MyChart Message (if you have MyChart) OR . A paper copy in the mail If you have any lab test that is abnormal or we need to change your treatment, we will call you to review the results.   Follow-Up: At [facility], you and your health needs are our priority.  As part of our continuing mission to provide you with exceptional heart care, we have created designated Provider Care Teams.  These Care Teams include your primary Cardiologist (physician) and Advanced Practice Providers (APPs -  Physician Assistants and Nurse Practitioners) who all work together to provide you with the care you need, when you need it.  We recommend signing up for the patient portal called "MyChart".  Sign up information is provided on this After Visit Summary.  MyChart is used to connect with patients for Virtual Visits (Telemedicine).  Patients are able to view lab/test results, encounter notes, upcoming appointments, etc.  Non-urgent messages can be sent to your provider as well.   To learn more about what you can do with MyChart, go to [email or web address].    Your next appointment:   4 week(s)  The format for your next appointment:   Virtual Visit   Provider:   Fabian Sharp, PA-C

## 2020-03-19 ENCOUNTER — Other Ambulatory Visit: Payer: Self-pay | Admitting: Family Medicine

## 2020-03-19 DIAGNOSIS — E119 Type 2 diabetes mellitus without complications: Secondary | ICD-10-CM

## 2020-03-19 DIAGNOSIS — Z794 Long term (current) use of insulin: Secondary | ICD-10-CM

## 2020-03-19 DIAGNOSIS — I10 Essential (primary) hypertension: Secondary | ICD-10-CM

## 2020-03-19 MED FILL — LOSARTAN POTASSIUM 100 MG T: 100 | 30 days supply | Qty: 30 | Fill #0

## 2020-03-19 MED FILL — METFORMIN HCL 500 MG TABS: 500 | 30 days supply | Qty: 90 | Fill #0

## 2020-03-19 MED FILL — JANUVIA 50 MG TABLET: 50 | 30 days supply | Qty: 30 | Fill #2

## 2020-03-19 MED FILL — ?ATORVASTATIN 40MG TABLET: 40 | 30 days supply | Qty: 30 | Fill #0

## 2020-04-14 NOTE — Progress Notes (Signed)
Virtual Visit via Video Note   This visit type was conducted due to national recommendations for restrictions regarding the COVID-19 Pandemic (e.g. social distancing) in an effort to limit this patient's exposure and mitigate transmission in our community.  Due to her co-morbid illnesses, this patient is at least at moderate risk for complications without adequate follow up.  This format is felt to be most appropriate for this patient at this time.  All issues noted in this document were discussed and addressed.  A limited physical exam was performed with this format.  Please refer to the patient's chart for her consent to telehealth for [facility] [place].   The patient was identified using 2 identifiers.  Date:  04/15/2020   ID:  Kimberly Herman, DOB 06-29-1966, MRN [id number]  Patient Location: Home Provider Location: Office  PCP:  Azzie Glatter, FNP  Cardiologist:  Quay Burow, MD  Electrophysiologist:  None   Evaluation Performed:  Follow-Up Visit  Chief Complaint:  hypertension  History of Present Illness:    Kimberly Herman is a 54 y.o. female with [name] is a 54 y.o. female with a hx of HTN, DM, and mild diastolic dysfunction. She saw Dr. Gwenlyn Found in Feb 2019 with complaints of dyspnea. Stress test was without reversible ischemia and echo was essentially normal with exception of mild diastolic dysfunction. Dr. Gwenlyn Found relayed that her dyspnea is likely not related to a cardiovascular process. I saw her in follow up on 03/18/20. She is fully vaccinated against COVID-19. She experienced lower leg swelling with amlodipine and BP was not at goal at that time. She also became her husband's primary care giver after a car accident left him with a leg amputation.   She was maintained on 7.5 mg norvasc, 25 mg hygroton, and 187m losartan. Due to a history of hypokalemia, I started her on spironolactone with instructions to have BMP drawn after 2 weeks. This has not been completed. Norvasc  was stopped. No BB for bradycardia.   She returns today for follow up. Lower leg swelling has resolved off of the norvasc. BP log reveals 129/86 today. BP was still elevated with 12.5 mg spironolactone in the 148/90, so she increased to 25 mg and BP is much improved to the 1093Asystolic. BMP following 2 weeks of 25 mg spironolactone shows increased sCr of 1.33 with a K of 3.8. She overall feels well with the exception of nausea after eating for the past several months. Unclear if this is associated with fatty foods or all foods. I have mentioned gastroparesis given her DM vs gallbladder etiology and asked her to follow up with her PCP on this. It doesn't sound like this is related to spironolactone. No other complaints, no chest pain or dyspnea. She is taking 40 mg lipitor.    The patient does not have symptoms concerning for COVID-19 infection (fever, chills, cough, or new shortness of breath).    Past Medical History:  Diagnosis Date  . Diabetes mellitus without complication ([place]   . Hypertension   . Menopause 2015  . Vaginal polyp 10/2019  . Vitamin D deficiency 08/2019   History reviewed. No pertinent surgical history.   Current Meds  Medication Sig  . atorvastatin (LIPITOR) 40 MG tablet TAKE 1 TABLET (40 MG TOTAL) BY MOUTH DAILY.  . blood glucose meter kit and supplies KIT Dispense based on patient and insurance preference. Use up to four times daily as directed. (FOR ICD-9 250.00, 250.01).  . Blood  Pressure Monitoring (BLOOD PRESSURE CUFF) MISC Check blood pressure once daily at the same time and record readings  . chlorthalidone (HYGROTON) 25 MG tablet TAKE 1 TABLET (25 MG TOTAL) BY MOUTH DAILY.  [name] esomeprazole (NEXIUM) 20 MG capsule Take 20 mg by mouth as needed.  [name] glucose blood (TRUE METRIX BLOOD GLUCOSE TEST) test strip TEST UP TO FOUR TIMES DAILY  . insulin aspart (NOVOLOG) 100 UNIT/ML injection Inject 10 Units into the skin as needed for high blood sugar (blood sugar >240).    . Insulin Glargine (LANTUS SOLOSTAR) 100 UNIT/ML Solostar Pen Inject 20 Units into the skin daily at 10 pm.  . losartan (COZAAR) 100 MG tablet TAKE 1 TABLET (100 MG TOTAL) BY MOUTH DAILY.  . metFORMIN (GLUCOPHAGE) 500 MG tablet TAKE 1 TABLET BY MOUTH 3 TIMES DAILY WITH MEALS.  [name] ondansetron (ZOFRAN ODT) 4 MG disintegrating tablet Take 1 tablet (4 mg total) by mouth every 8 (eight) hours as needed.  . sitaGLIPtin (JANUVIA) 50 MG tablet TAKE 1 TABLET (50 MG TOTAL) BY MOUTH DAILY.  [name] spironolactone (ALDACTONE) 25 MG tablet Take 25 mg by mouth daily.  . Vitamin D, Ergocalciferol, (DRISDOL) 1.25 MG (50000 UT) CAPS capsule Take 1 capsule (50,000 Units total) by mouth every 7 (seven) days.     Allergies:   Patient has no known allergies.   Social History   Tobacco Use  . Smoking status: Never Smoker  . Smokeless tobacco: Never Used  Substance Use Topics  . Alcohol use: Not Currently  . Drug use: No     Family Hx: The patient's family history includes Cancer in her mother.  ROS:   Please see the history of present illness.     All other systems reviewed and are negative.   Prior CV studies:   The following studies were reviewed today:  Echo 01/04/18: Study Conclusions   - Left ventricle: The cavity size was normal. Wall thickness was  increased in a pattern of moderate LVH. Systolic function was  normal. The estimated ejection fraction was in the range of 55%  to 60%. Wall motion was normal; there were no regional wall  motion abnormalities. Doppler parameters are consistent with  abnormal left ventricular relaxation (grade 1 diastolic  dysfunction).  - Left atrium: The atrium was mildly dilated.    Myoview stress test 12/2017:  The left ventricular ejection fraction is normal (55-65%).  Nuclear stress EF: 61%.  Blood pressure demonstrated a hypertensive response to exercise.  There was no ST segment deviation noted during stress.  The study is  normal.  This is a low risk study.  There is a small defect in the apical lateral region that is present at stress and rest. Rest images are poor and difficult to interpret. This may reprsent a small area of infarct, but is more likely artifactual.  Labs/Other Tests and Data Reviewed:    EKG:  No ECG reviewed.  Recent Labs: 08/29/2019: ALT 31; Hemoglobin 12.3; Platelets 225; TSH 3.880 04/14/2020: BUN 18; Creatinine, Ser 1.33; Potassium 3.8; Sodium 143   Recent Lipid Panel Lab Results  Component Value Date/Time   CHOL 199 08/29/2019 11:15 AM   TRIG 155 (H) 08/29/2019 11:15 AM   HDL 43 08/29/2019 11:15 AM   CHOLHDL 4.6 (H) 08/29/2019 11:15 AM   CHOLHDL 4.4 09/07/2017 09:33 AM   LDLCALC 128 (H) 08/29/2019 11:15 AM   LDLCALC 137 (H) 09/07/2017 09:33 AM    Wt Readings from Last 3 Encounters:  03/18/20 198  lb 6.4 oz (90 kg)  10/29/19 198 lb 9.6 oz (90.1 kg)  08/28/19 202 lb (91.6 kg)     Objective:    Vital Signs:  BP 129/86   Pulse 73    VITAL SIGNS:  reviewed GEN:  no acute distress EYES:  sclerae anicteric, EOMI - Extraocular Movements Intact RESPIRATORY:  normal respiratory effort, symmetric expansion CARDIOVASCULAR:  no peripheral edema SKIN:  no rash, lesions or ulcers. MUSCULOSKELETAL:  no obvious deformities. NEURO:  alert and oriented x 3, no obvious focal deficit PSYCH:  normal affect  ASSESSMENT & PLAN:    HTN - spironolactone, losartan, and chlorthalidone - BMP with mild bump in serum creatinine - potassium normal   - unfortunately, renal function has mildly reduced - will not make medication changes just yet - would like to recheck labs in 2 weeks - if sCr still elevated, will need to stop spironolactone - may need hydralazine? Would like to avoid norvasc since her leg swelling has improved off of it   SOB - normal stress test 12/2017 - echo with mild DD - 30 day event monitor was not completed - no further complaints   Hyperlipidemia - continue  40 mg lipitor 08/29/2019: Cholesterol, Total 199; HDL 43; LDL Chol Calc (NIH) 128; Triglycerides 155 - will collect repeat fasting lipids with BMP - may need to increase lipitor if LDL not under 100    COVID-19 Education: The signs and symptoms of COVID-19 were discussed with the patient and how to seek care for testing (follow up with PCP or arrange E-visit).  The importance of social distancing was discussed today.  Time:   Today, I have spent 26 minutes with the patient with telehealth technology discussing the above problems.     Medication Adjustments/Labs and Tests Ordered: Current medicines are reviewed at length with the patient today.  Concerns regarding medicines are outlined above.   Tests Ordered: No orders of the defined types were placed in this encounter.   Medication Changes: No orders of the defined types were placed in this encounter.   Follow Up:  Either In Person or Virtual in 2 week(s)  Signed, Ledora Bottcher, PA  04/15/2020 9:28 AM    [place]

## 2020-04-15 ENCOUNTER — Telehealth (INDEPENDENT_AMBULATORY_CARE_PROVIDER_SITE_OTHER): Payer: 59 | Admitting: Physician Assistant

## 2020-04-15 ENCOUNTER — Encounter: Payer: Self-pay | Admitting: Physician Assistant

## 2020-04-15 VITALS — BP 129/86 | HR 73

## 2020-04-15 DIAGNOSIS — R0600 Dyspnea, unspecified: Secondary | ICD-10-CM

## 2020-04-15 DIAGNOSIS — I10 Essential (primary) hypertension: Secondary | ICD-10-CM

## 2020-04-15 DIAGNOSIS — E7800 Pure hypercholesterolemia, unspecified: Secondary | ICD-10-CM | POA: Diagnosis not present

## 2020-04-15 LAB — BASIC METABOLIC PANEL
BUN/Creatinine Ratio: 14 (ref 9–23)
BUN: 18 mg/dL (ref 6–24)
CO2: 26 mmol/L (ref 20–29)
Calcium: 9.7 mg/dL (ref 8.7–10.2)
Chloride: 98 mmol/L (ref 96–106)
Creatinine, Ser: 1.33 mg/dL — ABNORMAL HIGH (ref 0.57–1.00)
GFR calc Af Amer: 52 mL/min/{1.73_m2} — ABNORMAL LOW (ref >59)
GFR calc non Af Amer: 45 mL/min/{1.73_m2} — ABNORMAL LOW (ref >59)
Glucose: 121 mg/dL — ABNORMAL HIGH (ref 65–99)
Potassium: 3.8 mmol/L (ref 3.5–5.2)
Sodium: 143 mmol/L (ref 134–144)

## 2020-04-15 NOTE — Patient Instructions (Addendum)
Medication Instructions:  Your Physician recommend you continue on your current medication as directed.    *If you need a refill on your cardiac medications before your next appointment, please call your pharmacy*   Lab Work: Your physician recommends that you return for lab work in 2 week ( BMP, Fasting Lipid)  If you have labs (blood work) drawn today and your tests are completely normal, you will receive your results only by: [name] MyChart Message (if you have MyChart) OR . A paper copy in the mail If you have any lab test that is abnormal or we need to change your treatment, we will call you to review the results.   Testing/Procedures: None   Follow-Up: At [facility], you and your health needs are our priority.  As part of our continuing mission to provide you with exceptional heart care, we have created designated Provider Care Teams.  These Care Teams include your primary Cardiologist (physician) and Advanced Practice Providers (APPs -  Physician Assistants and Nurse Practitioners) who all work together to provide you with the care you need, when you need it.  We recommend signing up for the patient portal called "MyChart".  Sign up information is provided on this After Visit Summary.  MyChart is used to connect with patients for Virtual Visits (Telemedicine).  Patients are able to view lab/test results, encounter notes, upcoming appointments, etc.  Non-urgent messages can be sent to your provider as well.   To learn more about what you can do with MyChart, go to [email or web address].    Your next appointment:   4 months  The format for your next appointment:   In Person  Provider:   Quay Burow, MD  Your physician recommends that you follow-up on Friday 05/02/20 at 11:45 am if labs are abnormal.

## 2020-04-28 ENCOUNTER — Ambulatory Visit: Payer: Self-pay | Admitting: Family Medicine

## 2020-04-29 ENCOUNTER — Other Ambulatory Visit: Payer: Self-pay | Admitting: Family Medicine

## 2020-04-29 DIAGNOSIS — Z794 Long term (current) use of insulin: Secondary | ICD-10-CM

## 2020-04-29 DIAGNOSIS — I10 Essential (primary) hypertension: Secondary | ICD-10-CM

## 2020-04-29 MED FILL — ?CHLORTHALIDONE 25MG TABL: 25 | 30 days supply | Qty: 30 | Fill #2

## 2020-05-05 ENCOUNTER — Telehealth: Payer: Self-pay | Admitting: *Deleted

## 2020-05-05 NOTE — Telephone Encounter (Signed)
Left detailed message (ok per DPR) to have labs completed prior to OV if possible.

## 2020-05-05 NOTE — Telephone Encounter (Signed)
-----   Message from Ledora Bottcher, [place] sent at 05/05/2020  1:50 PM EDT ----- Will you please call the patient and ask if she will come in for labs - BMP - today or tomorrow prior to her appt on Wed? She was supposed to have labs last week.   Thanks Angie

## 2020-05-05 NOTE — Progress Notes (Signed)
Cardiology Telemedicine Note:    This visit type was conducted due to national recommendations for restrictions regarding the COVID-19 Pandemic (e.g. social distancing) in an effort to limit this patient's exposure and mitigate transmission in our community.  Due to her co-morbid illnesses, this patient is at least at moderate risk for complications without adequate follow up.  This format is felt to be most appropriate for this patient at this time.  All issues noted in this document were discussed and addressed.  A limited physical exam was performed with this format.  Please refer to the patient's chart for her consent to telehealth for [facility].   The patient was identified using 2 identifiers.  Date:  05/07/2020   ID:  Kimberly Herman, DOB Sep 20, 1966, MRN [id number]  Patient Location: Home Provider Location: Office  PCP:  Azzie Glatter, FNP  Cardiologist:  Quay Burow, MD  Electrophysiologist:  None   Evaluation Performed:  Follow-Up Visit   Date:  05/07/2020   ID:  [name], DOB 1966/06/14, MRN [id number]  PCP:  Azzie Glatter, FNP  Cardiologist:  Quay Burow, MD   Referring MD: Azzie Glatter, FNP   Chief Complaint  Patient presents with  . Follow-up    HTN    History of Present Illness:    Kimberly Herman is a 54 y.o. female with a hx of HTN, DM, and mild diastolic dysfunction. She saw Dr. Gwenlyn Found in Feb 2019 with complaints of dyspnea. Stress test was without reversible ischemia and echo was essentially normal with exception of mild diastolic dysfunction. Dr. Gwenlyn Found relayed that her dyspnea is likely not related to a cardiovascular process. I saw her in follow up on 03/18/20. She is fully vaccinated against COVID-19. She experienced lower leg swelling with amlodipine and BP was not at goal at that time. She also became her husband's primary care giver after a car accident left him with a leg amputation. She was maintained on 7.5 mg norvasc, 25 mg hygroton,  and 176m losartan. Due to a history of hypokalemia, I started her on spironolactone with instructions to have BMP drawn after 2 weeks. This has not been completed. Norvasc was stopped and her leg edema improved. No BB for bradycardia. Spironolactone was increased to 25 mg for pressure control. Unfortunately, BMP 2 weeks later showed mild reduction in renal function. I asked for a repeat BMP 2 weeks later. It does not appear this was completed.   She returns today for follow up. She reports her BP is much improved, well-controlled today at 128/79. No lower extremity swelling, SOB, or chest pain. Overall she is feeling well with no complaints. We discussed that she needed labs ASAP.    Past Medical History:  Diagnosis Date  . [place]   . Hypertension   . Menopause 2015  . Vaginal polyp 10/2019  . Vitamin D deficiency 08/2019    No past surgical history on file.  Current Medications: Current Meds  Medication Sig  . atorvastatin (LIPITOR) 40 MG tablet TAKE 1 TABLET (40 MG TOTAL) BY MOUTH DAILY.  . blood glucose meter kit and supplies KIT Dispense based on patient and insurance preference. Use up to four times daily as directed. (FOR ICD-9 250.00, 250.01).  . Blood Pressure Monitoring (BLOOD PRESSURE CUFF) MISC Check blood pressure once daily at the same time and record readings  . esomeprazole (NEXIUM) 20 MG capsule Take 20 mg by mouth as needed.  .Marland Kitchenglucose blood (TRUE  METRIX BLOOD GLUCOSE TEST) test strip TEST UP TO FOUR TIMES DAILY  . insulin aspart (NOVOLOG) 100 UNIT/ML injection Inject 10 Units into the skin as needed for high blood sugar (blood sugar >240).  . Insulin Glargine (LANTUS SOLOSTAR) 100 UNIT/ML Solostar Pen Inject 20 Units into the skin daily at 10 pm.  . losartan (COZAAR) 100 MG tablet TAKE 1 TABLET (100 MG TOTAL) BY MOUTH DAILY.  . metFORMIN (GLUCOPHAGE) 500 MG tablet TAKE 1 TABLET BY MOUTH 3 TIMES DAILY WITH MEALS.  [name] ondansetron  (ZOFRAN ODT) 4 MG disintegrating tablet Take 1 tablet (4 mg total) by mouth every 8 (eight) hours as needed.  . sitaGLIPtin (JANUVIA) 50 MG tablet TAKE 1 TABLET (50 MG TOTAL) BY MOUTH DAILY.  [name] spironolactone (ALDACTONE) 25 MG tablet Take 25 mg by mouth daily.  . Vitamin D, Ergocalciferol, (DRISDOL) 1.25 MG (50000 UT) CAPS capsule Take 1 capsule (50,000 Units total) by mouth every 7 (seven) days.     Allergies:   Patient has no known allergies.   Social History   Socioeconomic History  . Marital status: Single    Spouse name: Not on file  . Number of children: Not on file  . Years of education: Not on file  . Highest education level: Not on file  Occupational History  . Not on file  Tobacco Use  . Smoking status: Never Smoker  . Smokeless tobacco: Never Used  Vaping Use  . Vaping Use: Never used  Substance and Sexual Activity  . Alcohol use: Not Currently  . Drug use: No  . Sexual activity: Yes  Other Topics Concern  . Not on file  Social History Narrative   Patient recently lost youngest son he was killed in a motor cycle accident at age 29. June 2018   Social Determinants of Health   Financial Resource Strain:   . Difficulty of Paying Living Expenses:   Food Insecurity:   . Worried About Charity fundraiser in the Last Year:   . Arboriculturist in the Last Year:   Transportation Needs:   . Film/video editor (Medical):   [name] Lack of Transportation (Non-Medical):   Physical Activity:   . Days of Exercise per Week:   . Minutes of Exercise per Session:   Stress:   . Feeling of Stress :   Social Connections:   . Frequency of Communication with Friends and Family:   . Frequency of Social Gatherings with Friends and Family:   . Attends Religious Services:   . Active Member of Clubs or Organizations:   . Attends Archivist Meetings:   [name] Marital Status:      Family History: The patient's family history includes Cancer in her mother.  ROS:   Please see  the history of present illness.     All other systems reviewed and are negative.  EKGs/Labs/Other Studies Reviewed:    The following studies were reviewed today:  Echo 12/2017: Study Conclusions   - Left ventricle: The cavity size was normal. Wall thickness was  increased in a pattern of moderate LVH. Systolic function was  normal. The estimated ejection fraction was in the range of 55%  to 60%. Wall motion was normal; there were no regional wall  motion abnormalities. Doppler parameters are consistent with  abnormal left ventricular relaxation (grade 1 diastolic  dysfunction).  - Left atrium: The atrium was mildly dilated.  EKG:  EKG is not ordered today.    Recent  Labs: 08/29/2019: ALT 31; Hemoglobin 12.3; Platelets 225; TSH 3.880 04/14/2020: BUN 18; Creatinine, Ser 1.33; Potassium 3.8; Sodium 143  Recent Lipid Panel    Component Value Date/Time   CHOL 199 08/29/2019 1115   TRIG 155 (H) 08/29/2019 1115   HDL 43 08/29/2019 1115   CHOLHDL 4.6 (H) 08/29/2019 1115   CHOLHDL 4.4 09/07/2017 0933   VLDL 49 (H) 03/04/2017 1543   LDLCALC 128 (H) 08/29/2019 1115   LDLCALC 137 (H) 09/07/2017 0933    Physical Exam:    VS:  BP 128/79   Ht '5\' 7"'  (1.702 m)   Wt 195 lb (88.5 kg)   BMI 30.54 kg/m   BP   Wt Readings from Last 3 Encounters:  05/07/20 195 lb (88.5 kg)  03/18/20 198 lb 6.4 oz (90 kg)  10/29/19 198 lb 9.6 oz (90.1 kg)     GEN: Well nourished, well developed in no acute distress HEENT: Normal NECK: No JVD RESPIRATORY:  Respirations unlabored  MUSCULOSKELETAL:  No edema NEUROLOGIC:  Alert and oriented x 3 PSYCHIATRIC:  Normal affect   ASSESSMENT:    1. Essential hypertension   2. Shortness of breath   3. Pure hypercholesterolemia    PLAN:    In order of problems listed above:  HTN - spironolactone, losartan, and chlorthalidone - needs BMP ASAP - pressure well-controlled    SOB - normal stress test 12/2017 - echo with mild DD - 30 day  event monitor was not completed - no further complaints   Hyperlipidemia - continue 40 mg lipitor 08/29/2019: Cholesterol, Total 199; HDL 43; LDL Chol Calc (NIH) 128; Triglycerides 155 - need fasting labs - may need to increase lipitor   Follow up with Dr. Gwenlyn Found in 6 months, sooner if needed based on labs.  I spent 15 minutes on direct patient care.   Medication Adjustments/Labs and Tests Ordered: Current medicines are reviewed at length with the patient today.  Concerns regarding medicines are outlined above.  No orders of the defined types were placed in this encounter.  No orders of the defined types were placed in this encounter.   Signed, Ledora Bottcher, [place]  05/07/2020 12:09 PM    [facility]

## 2020-05-07 ENCOUNTER — Encounter: Payer: Self-pay | Admitting: Physician Assistant

## 2020-05-07 ENCOUNTER — Telehealth (INDEPENDENT_AMBULATORY_CARE_PROVIDER_SITE_OTHER): Payer: 59 | Admitting: Physician Assistant

## 2020-05-07 VITALS — BP 128/79 | Ht 67.0 in | Wt 195.0 lb

## 2020-05-07 DIAGNOSIS — R0602 Shortness of breath: Secondary | ICD-10-CM | POA: Diagnosis not present

## 2020-05-07 DIAGNOSIS — I10 Essential (primary) hypertension: Secondary | ICD-10-CM | POA: Diagnosis not present

## 2020-05-07 DIAGNOSIS — E7800 Pure hypercholesterolemia, unspecified: Secondary | ICD-10-CM | POA: Diagnosis not present

## 2020-05-07 NOTE — Patient Instructions (Signed)
Medication Instructions:  Your physician recommends that you continue on your current medications as directed. Please refer to the Current Medication list given to you today.  *If you need a refill on your cardiac medications before your next appointment, please call your pharmacy*   Lab Work: Please come for blood work tomorrow, 6/17.  If you have labs (blood work) drawn today and your tests are completely normal, you will receive your results only by: [name] MyChart Message (if you have MyChart) OR . A paper copy in the mail If you have any lab test that is abnormal or we need to change your treatment, we will call you to review the results.  Follow-Up: At [facility], you and your health needs are our priority.  As part of our continuing mission to provide you with exceptional heart care, we have created designated Provider Care Teams.  These Care Teams include your primary Cardiologist (physician) and Advanced Practice Providers (APPs -  Physician Assistants and Nurse Practitioners) who all work together to provide you with the care you need, when you need it.  We recommend signing up for the patient portal called "MyChart".  Sign up information is provided on this After Visit Summary.  MyChart is used to connect with patients for Virtual Visits (Telemedicine).  Patients are able to view lab/test results, encounter notes, upcoming appointments, etc.  Non-urgent messages can be sent to your provider as well.   To learn more about what you can do with MyChart, go to [email or web address].    Your next appointment:   6 month(s) or sooner depending on lab results  The format for your next appointment:   In Person  Provider:   You may see Quay Burow, MD or one of the following Advanced Practice Providers on your designated Care Team:    Kerin Ransom, PA-C  [place], [place]  Coletta Memos, [place]    Other Instructions We will call you with your lab results and let you know if  you need to be seen sooner.

## 2020-05-08 LAB — BASIC METABOLIC PANEL
BUN/Creatinine Ratio: 17 (ref 9–23)
BUN: 24 mg/dL (ref 6–24)
CO2: 26 mmol/L (ref 20–29)
Calcium: 9.7 mg/dL (ref 8.7–10.2)
Chloride: 100 mmol/L (ref 96–106)
Creatinine, Ser: 1.4 mg/dL — ABNORMAL HIGH (ref 0.57–1.00)
GFR calc Af Amer: 48 mL/min/{1.73_m2} — ABNORMAL LOW (ref >59)
GFR calc non Af Amer: 42 mL/min/{1.73_m2} — ABNORMAL LOW (ref >59)
Glucose: 198 mg/dL — ABNORMAL HIGH (ref 65–99)
Potassium: 4.1 mmol/L (ref 3.5–5.2)
Sodium: 141 mmol/L (ref 134–144)

## 2020-05-08 LAB — LIPID PANEL
Chol/HDL Ratio: 3.3 ratio (ref 0.0–4.4)
Cholesterol, Total: 151 mg/dL (ref 100–199)
HDL: 46 mg/dL (ref >39)
LDL Chol Calc (NIH): 78 mg/dL (ref 0–99)
Triglycerides: 155 mg/dL — ABNORMAL HIGH (ref 0–149)
VLDL Cholesterol Cal: 27 mg/dL (ref 5–40)

## 2020-05-09 ENCOUNTER — Telehealth: Payer: Self-pay | Admitting: Physician Assistant

## 2020-05-09 ENCOUNTER — Other Ambulatory Visit: Payer: Self-pay | Admitting: *Deleted

## 2020-05-09 ENCOUNTER — Other Ambulatory Visit: Payer: Self-pay | Admitting: Physician Assistant

## 2020-05-09 DIAGNOSIS — I10 Essential (primary) hypertension: Secondary | ICD-10-CM

## 2020-05-09 MED ORDER — CANDESARTAN CILEXETIL 32 MG PO TABS: 32.0000 mg | ORAL_TABLET | Freq: Every day | ORAL | 11 refills | Status: DC

## 2020-05-09 MED FILL — CANDESARTAN CILEXETIL 32 MG: 32 | 30 days supply | Qty: 30 | Fill #0

## 2020-05-09 NOTE — Telephone Encounter (Signed)
Ledora Bottcher, [place]  05/09/2020 9:36 AM EDT     Your cholesterol profile looks much better. Unfortunately, your renal function has continued to decline with the spironolactone. Please stop the spironolactone and losartan. Continue the hygroton. I will start 32 mg candesartan to replace the losartan for better pressure control.   Please continue checking your BP once daily 2 hrs after morning medications. We will need to repeat labs again in 2-3 weeks.   Nisa Decaire, can you please see if we can get her a pharmacist appt at [place] or [place] in the next 2 weeks with labs? Can you please make sure her med list is updates?     Patient aware of results and recommendations.  Rx have been sent to pharmacy.  Patient will continue to monitor bp and bring log to appt with pharmD.   Med list updated.    Message sent to scheduler to arrange appt with pharmD.   Patient verbalized understanding.

## 2020-05-09 NOTE — Telephone Encounter (Signed)
Kimberly Herman is returning Hayley's call in regards to lab results. Please advise.

## 2020-05-15 ENCOUNTER — Telehealth: Payer: Self-pay | Admitting: *Deleted

## 2020-05-15 NOTE — Telephone Encounter (Signed)
A detailed message was left, re: appointment with the PharmD.

## 2020-06-06 MED FILL — ATORVASTATIN CALCIUM 40 MG: 40 | 30 days supply | Qty: 30 | Fill #1

## 2020-06-06 MED FILL — CANDESARTAN CILEXETIL 32 MG: 32 | 30 days supply | Qty: 30 | Fill #1

## 2020-06-06 MED FILL — CHLORTHALIDONE 25 MG TAB: 25 | 30 days supply | Qty: 30 | Fill #3

## 2020-06-11 ENCOUNTER — Other Ambulatory Visit: Payer: Self-pay | Admitting: Family Medicine

## 2020-06-11 DIAGNOSIS — E119 Type 2 diabetes mellitus without complications: Secondary | ICD-10-CM

## 2020-06-11 DIAGNOSIS — Z794 Long term (current) use of insulin: Secondary | ICD-10-CM

## 2020-06-11 MED ORDER — METFORMIN HCL 1000 MG PO TABS: 1000.0000 mg | ORAL_TABLET | Freq: Two times a day (BID) | ORAL | 1 refills | Status: DC

## 2020-06-11 MED FILL — metFORMIN HCL 1000 MG TABS: 1000 | 30 days supply | Qty: 60 | Fill #0

## 2020-06-13 ENCOUNTER — Other Ambulatory Visit: Payer: Self-pay | Admitting: Family Medicine

## 2020-06-13 ENCOUNTER — Telehealth: Payer: Self-pay

## 2020-06-13 DIAGNOSIS — Z794 Long term (current) use of insulin: Secondary | ICD-10-CM

## 2020-06-13 DIAGNOSIS — E119 Type 2 diabetes mellitus without complications: Secondary | ICD-10-CM

## 2020-06-13 MED ORDER — GLIPIZIDE 10 MG PO TABS: 10.0000 mg | ORAL_TABLET | Freq: Two times a day (BID) | ORAL | 11 refills | Status: DC

## 2020-06-13 MED FILL — glipiZIDE 10 MG TABS: 10 | 30 days supply | Qty: 60 | Fill #0

## 2020-06-13 NOTE — Telephone Encounter (Signed)
Called and spoke with patient. Januvia and Janumet are both not on formulary. Patient will call insurance to see what is covered and the contact our office so new rx can be sent in. Thanks!

## 2020-06-13 NOTE — Telephone Encounter (Signed)
Kimberly Herman. Patient's insurance will cover Glipizide. Can this be changed? Please advise.

## 2020-06-13 NOTE — Telephone Encounter (Signed)
Kimberly Herman. Patient's insurance will cover Glipizide. Can this be changed? Please advise

## 2020-06-13 NOTE — Telephone Encounter (Signed)
Patient aware.

## 2020-06-17 ENCOUNTER — Telehealth: Payer: Self-pay | Admitting: Family Medicine

## 2020-06-18 NOTE — Telephone Encounter (Signed)
Patient is confused about how to metformin should be taken. I advised her in the computer that it states 1000mg  twice daily. She states this was recently changed from 500mg  and she though she was to take only 1 1000mg  tablet daily. Can you clarify this? She has been informed that Lanelle Bal will not be back until Monday and this may be something she needs to answer.

## 2020-06-18 NOTE — Telephone Encounter (Signed)
Please call and clarify

## 2020-06-25 ENCOUNTER — Encounter: Payer: Self-pay | Admitting: Family Medicine

## 2020-06-25 ENCOUNTER — Ambulatory Visit (INDEPENDENT_AMBULATORY_CARE_PROVIDER_SITE_OTHER): Payer: 59 | Admitting: Family Medicine

## 2020-06-25 ENCOUNTER — Other Ambulatory Visit: Payer: Self-pay

## 2020-06-25 VITALS — BP 136/76 | HR 76 | Temp 97.9°F | Ht 67.0 in | Wt 198.6 lb

## 2020-06-25 DIAGNOSIS — R739 Hyperglycemia, unspecified: Secondary | ICD-10-CM

## 2020-06-25 DIAGNOSIS — R7309 Other abnormal glucose: Secondary | ICD-10-CM

## 2020-06-25 DIAGNOSIS — K219 Gastro-esophageal reflux disease without esophagitis: Secondary | ICD-10-CM

## 2020-06-25 DIAGNOSIS — Z794 Long term (current) use of insulin: Secondary | ICD-10-CM

## 2020-06-25 DIAGNOSIS — Z1211 Encounter for screening for malignant neoplasm of colon: Secondary | ICD-10-CM

## 2020-06-25 DIAGNOSIS — R109 Unspecified abdominal pain: Secondary | ICD-10-CM

## 2020-06-25 DIAGNOSIS — R10A2 Flank pain, left side: Secondary | ICD-10-CM

## 2020-06-25 DIAGNOSIS — E119 Type 2 diabetes mellitus without complications: Secondary | ICD-10-CM

## 2020-06-25 DIAGNOSIS — I10 Essential (primary) hypertension: Secondary | ICD-10-CM

## 2020-06-25 DIAGNOSIS — Z09 Encounter for follow-up examination after completed treatment for conditions other than malignant neoplasm: Secondary | ICD-10-CM

## 2020-06-25 DIAGNOSIS — G8929 Other chronic pain: Secondary | ICD-10-CM

## 2020-06-25 DIAGNOSIS — Z1231 Encounter for screening mammogram for malignant neoplasm of breast: Secondary | ICD-10-CM

## 2020-06-25 LAB — POCT GLYCOSYLATED HEMOGLOBIN (HGB A1C)
HbA1c POC (<> result, manual entry): 5.6 % (ref 4.0–5.6)
HbA1c, POC (controlled diabetic range): 5.6 % (ref 0.0–7.0)
HbA1c, POC (prediabetic range): 5.6 % — AB (ref 5.7–6.4)
Hemoglobin A1C: 5.6 % (ref 4.0–5.6)

## 2020-06-25 LAB — POCT URINALYSIS DIPSTICK
Bilirubin, UA: NEGATIVE
Glucose, UA: NEGATIVE
Ketones, UA: NEGATIVE
Leukocytes, UA: NEGATIVE
Nitrite, UA: NEGATIVE
Protein, UA: NEGATIVE
Spec Grav, UA: 1.02 (ref 1.010–1.025)
Urobilinogen, UA: 0.2 E.U./dL
pH, UA: 7 (ref 5.0–8.0)

## 2020-06-25 LAB — GLUCOSE, POCT (MANUAL RESULT ENTRY): POC Glucose: 107 mg/dl — AB (ref 70–99)

## 2020-06-25 MED ORDER — ESOMEPRAZOLE MAGNESIUM 20 MG PO CPDR: 20.0000 mg | DELAYED_RELEASE_CAPSULE | ORAL | 11 refills | Status: DC | PRN

## 2020-06-25 NOTE — Progress Notes (Signed)
 [facility]   Established Patient Office Visit  Subjective:  Patient ID: Kimberly Herman, female    DOB: 06/21/1966  Age: 54 y.o. MRN: [id number]  CC:  Chief Complaint  Patient presents with  . Follow-up    Pt states she is here for a f/u on her A1C to get it checked because it was high 8 months ago.    HPI Kimberly Herman is a 54 year old female hwo presents for Follow Up today.    Patient Active Problem List   Diagnosis Date Noted  . Vaginal polyp 10/30/2019  . Encounter for gynecological examination with Papanicolaou smear of cervix 10/30/2019  . Elevated glucose 08/30/2019  . Hemoglobin A1C between 7% and 9% indicating borderline diabetic control 08/30/2019  . Hyperlipidemia 12/27/2017  . Shortness of breath 12/27/2017  . T2DM (type 2 diabetes mellitus) (HCC) 03/07/2017  . Hyperglycemia 03/07/2017  . HTN (hypertension) 03/07/2017  . ANEMIA, IRON DEFICIENCY, UNSPEC. 01/19/2007   Past Medical History:  Diagnosis Date  . Diabetes mellitus without complication (HCC)   . Hypertension   . Menopause 2015  . Vaginal polyp 10/2019  . Vitamin D deficiency 08/2019    No past surgical history on file.  Family History  Problem Relation Age of Onset  . Cancer Mother    Current Status: Since her last office visit, she is doing well with no complaints. Her most recent normal range of preprandial blood glucose levels have been between 130-136. She has seen low range of 107 and high of 136since his last office visit. She denies fatigue, frequent urination, blurred vision, excessive hunger, excessive thirst, weight gain, weight loss, and poor wound healing. She continues to check her feet regularly. She denies visual changes, chest pain, cough, shortness of breath, heart palpitations, and falls. She has occasional headaches and dizziness with position changes. Denies severe headaches, confusion, seizures, double vision, and blurred  vision, nausea and vomiting. She denies fevers, chills, recent infections, weight loss, and night sweats. Denies GI problems such as diarrhea, and constipation. She has no reports of blood in stools, dysuria and hematuria. No depression or anxiety, and denies suicidal ideations, homicidal ideations, or auditory hallucinations. She is taking all medications as prescribed. She denies pain today.   Social History   Socioeconomic History  . Marital status: Single    Spouse name: Not on file  . Number of children: Not on file  . Years of education: Not on file  . Highest education level: Not on file  Occupational History  . Not on file  Tobacco Use  . Smoking status: Never Smoker  . Smokeless tobacco: Never Used  Vaping Use  . Vaping Use: Never used  Substance and Sexual Activity  . Alcohol use: Not Currently  . Drug use: No  . Sexual activity: Yes  Other Topics Concern  . Not on file  Social History Narrative   Patient recently lost youngest son he was killed in a motor cycle accident at age 29. June 2018   Social Determinants of Health   Financial Resource Strain:   . Difficulty of Paying Living Expenses:   Food Insecurity:   . Worried About Running Out of Food in the Last Year:   . Ran Out of Food in the Last Year:   Transportation Needs:   . Lack of Transportation (Medical):   . Lack of Transportation (Non-Medical):   Physical Activity:   . Days of Exercise   per Week:   . Minutes of Exercise per Session:   Stress:   . Feeling of Stress :   Social Connections:   . Frequency of Communication with Friends and Family:   . Frequency of Social Gatherings with Friends and Family:   . Attends Religious Services:   . Active Member of Clubs or Organizations:   . Attends Archivist Meetings:   [name] Marital Status:   Intimate Partner Violence:   . Fear of Current or Ex-Partner:   . Emotionally Abused:   [name] Physically Abused:   . Sexually Abused:     Outpatient  Medications Prior to Visit  Medication Sig Dispense Refill  . atorvastatin (LIPITOR) 40 MG tablet TAKE 1 TABLET (40 MG TOTAL) BY MOUTH DAILY. 30 tablet 1  . blood glucose meter kit and supplies KIT Dispense based on patient and insurance preference. Use up to four times daily as directed. (FOR ICD-9 250.00, 250.01). 1 each 0  . Blood Pressure Monitoring (BLOOD PRESSURE CUFF) MISC Check blood pressure once daily at the same time and record readings 1 each 0  . candesartan (ATACAND) 32 MG tablet Take 1 tablet (32 mg total) by mouth daily. 30 tablet 11  . chlorthalidone (HYGROTON) 25 MG tablet TAKE 1 TABLET (25 MG TOTAL) BY MOUTH DAILY. 30 tablet 0  . glipiZIDE (GLUCOTROL) 10 MG tablet Take 1 tablet (10 mg total) by mouth 2 (two) times daily before a meal. 60 tablet 11  . glucose blood (TRUE METRIX BLOOD GLUCOSE TEST) test strip TEST UP TO FOUR TIMES DAILY 100 each 0  . metFORMIN (GLUCOPHAGE) 1000 MG tablet Take 1 tablet (1,000 mg total) by mouth 2 (two) times daily with a meal. 180 tablet 1  . esomeprazole (NEXIUM) 20 MG capsule Take 20 mg by mouth as needed.     . insulin aspart (NOVOLOG) 100 UNIT/ML injection Inject 10 Units into the skin as needed for high blood sugar (blood sugar >240). (Patient not taking: Reported on 06/25/2020) 10 mL 3  . Insulin Glargine (LANTUS SOLOSTAR) 100 UNIT/ML Solostar Pen Inject 20 Units into the skin daily at 10 pm. (Patient not taking: Reported on 06/25/2020) 15 mL 3  . ondansetron (ZOFRAN ODT) 4 MG disintegrating tablet Take 1 tablet (4 mg total) by mouth every 8 (eight) hours as needed. (Patient not taking: Reported on 06/25/2020) 10 tablet 0  . Vitamin D, Ergocalciferol, (DRISDOL) 1.25 MG (50000 UT) CAPS capsule Take 1 capsule (50,000 Units total) by mouth every 7 (seven) days. (Patient not taking: Reported on 06/25/2020) 5 capsule 3  . sitaGLIPtin (JANUVIA) 50 MG tablet TAKE 1 TABLET (50 MG TOTAL) BY MOUTH DAILY. (Patient not taking: Reported on 06/25/2020) 30 tablet 6    No facility-administered medications prior to visit.    No Known Allergies  ROS Review of Systems  Constitutional: Negative.   HENT: Negative.   Eyes: Negative.   Respiratory: Negative.   Cardiovascular: Negative.   Gastrointestinal: Positive for abdominal distention.  Endocrine: Negative.   Genitourinary: Negative.   Musculoskeletal: Positive for arthralgias (generalized joint pain), back pain (left flank area) and myalgias.  Skin: Negative.   Allergic/Immunologic: Negative.   Neurological: Positive for dizziness (occasional ) and headaches (occasional ).  Hematological: Negative.   Psychiatric/Behavioral: Negative.     Objective:    Physical Exam Vitals and nursing note reviewed.  Constitutional:      Appearance: Normal appearance.  HENT:     Head: Normocephalic and atraumatic.  Nose: Nose normal.     Mouth/Throat:     Mouth: Mucous membranes are moist.     Pharynx: Oropharynx is clear.  Cardiovascular:     Rate and Rhythm: Normal rate and regular rhythm.     Pulses: Normal pulses.     Heart sounds: Normal heart sounds.  Pulmonary:     Effort: Pulmonary effort is normal.     Breath sounds: Normal breath sounds.  Abdominal:     General: Bowel sounds are normal. There is distension (occasional ).     Palpations: Abdomen is soft.  Musculoskeletal:        General: Normal range of motion.     Cervical back: Normal range of motion and neck supple.  Skin:    General: Skin is warm and dry.  Neurological:     General: No focal deficit present.     Mental Status: She is alert and oriented to person, place, and time.  Psychiatric:        Mood and Affect: Mood normal.        Behavior: Behavior normal.        Judgment: Judgment normal.     BP 136/76 (BP Location: Left Arm, Patient Position: Sitting, Cuff Size: Normal)   Pulse 76   Temp 97.9 F (36.6 C)   Ht 5' 7" (1.702 m)   Wt 198 lb 9.6 oz (90.1 kg)   SpO2 100%   BMI 31.11 kg/m  Wt Readings from Last  3 Encounters:  06/25/20 198 lb 9.6 oz (90.1 kg)  05/07/20 195 lb (88.5 kg)  03/18/20 198 lb 6.4 oz (90 kg)     Health Maintenance Due  Topic Date Due  . Hepatitis C Screening  Never done  . PNEUMOCOCCAL POLYSACCHARIDE VACCINE AGE 33-64 HIGH RISK  Never done  . OPHTHALMOLOGY EXAM  Never done  . COVID-19 Vaccine (1) Never done  . HIV Screening  Never done  . MAMMOGRAM  Never done  . COLONOSCOPY  Never done  . FOOT EXAM  12/09/2018  . HEMOGLOBIN A1C  02/26/2020  . INFLUENZA VACCINE  06/22/2020    There are no preventive care reminders to display for this patient.  Lab Results  Component Value Date   TSH 3.880 08/29/2019   Lab Results  Component Value Date   WBC 5.5 08/29/2019   HGB 12.3 08/29/2019   HCT 36.0 08/29/2019   MCV 86 08/29/2019   PLT 225 08/29/2019   Lab Results  Component Value Date   NA 141 05/08/2020   K 4.1 05/08/2020   CO2 26 05/08/2020   GLUCOSE 198 (H) 05/08/2020   BUN 24 05/08/2020   CREATININE 1.40 (H) 05/08/2020   BILITOT 0.6 08/29/2019   ALKPHOS 117 08/29/2019   AST 24 08/29/2019   ALT 31 08/29/2019   PROT 7.7 08/29/2019   ALBUMIN 4.3 08/29/2019   CALCIUM 9.7 05/08/2020   ANIONGAP 11 12/15/2018   Lab Results  Component Value Date   CHOL 151 05/08/2020   Lab Results  Component Value Date   HDL 46 05/08/2020   Lab Results  Component Value Date   LDLCALC 78 05/08/2020   Lab Results  Component Value Date   TRIG 155 (H) 05/08/2020   Lab Results  Component Value Date   CHOLHDL 3.3 05/08/2020   Lab Results  Component Value Date   HGBA1C 9.0 (A) 08/28/2019   Assessment & Plan:   1. Type 2 diabetes mellitus without complication, with long-term current use of  insulin ([place]) Sh will continue medication as prescribed, to decrease foods/beverages high in sugars and carbs and follow Heart Healthy or DASH diet. Increase physical activity to at least 30 minutes cardio exercise daily.  - POC Glucose (CBG) - POC HgB A1c - Urinalysis  Dipstick  2. Hemoglobin A1C less then 7%  3. Hyperglycemia  4. Encounter for diabetic foot exam ([place]) Negative. Foot exam tolerated well. No areas of decreased sensitivity noted upon foot exam. Patient counseled on proper foot hygiene. She is encouraged to exam feet often (daily), using mirror if necessary; keep feet clean and dry (especially between toes), keep feet moistened, wear cotton socks, and avoid wearing open-toed shoes, high-heel shoes, and sandals. Patient verbalized understanding.   5. Essential hypertension The current medical regimen is effective; blood pressure is stable at 136/76 today; continue present plan and medications as prescribed. She will continue to take medications as prescribed, to decrease high sodium intake, excessive alcohol intake, increase potassium intake, smoking cessation, and increase physical activity of at least 30 minutes of cardio activity daily. She will continue to follow Heart Healthy or DASH diet.  6. Left flank pain, chronic  7. Gastroesophageal reflux disease without esophagitis - esomeprazole (NEXIUM) 20 MG capsule; Take 1 capsule (20 mg total) by mouth as needed.  Dispense: 30 capsule; Refill: 11  8. Encounter for screening mammogram for malignant neoplasm of breast - MM DIGITAL SCREENING BILATERAL; Future  9. Colon cancer screening We will assess for Colon Cancer Screening via Cologuard.   10. Follow up She will follow up in 6 months.   Meds ordered this encounter  Medications  . esomeprazole (NEXIUM) 20 MG capsule    Sig: Take 1 capsule (20 mg total) by mouth as needed.    Dispense:  30 capsule    Refill:  11    Orders Placed This Encounter  Procedures  . MM DIGITAL SCREENING BILATERAL  . POC Glucose (CBG)  . POC HgB A1c  . Urinalysis Dipstick    Referral Orders  No referral(s) requested today   [name],  MSN, FNP-BC [facility] [address] [place], [place] [address] [phone] fax   Problem List Items Addressed This Visit      Cardiovascular and Mediastinum   HTN (hypertension)     Endocrine   Hemoglobin A1C between 7% and 9% indicating borderline diabetic control   T2DM (type 2 diabetes mellitus) ([place]) - Primary   Relevant Orders   POC Glucose (CBG)   POC HgB A1c   Urinalysis Dipstick     Other   Hyperglycemia    Other Visit Diagnoses    Encounter for diabetic foot exam ([place])       Left flank pain, chronic       Gastroesophageal reflux disease without esophagitis       Relevant Medications   esomeprazole (NEXIUM) 20 MG capsule   Encounter for screening mammogram for malignant neoplasm of breast       Relevant Orders   MM DIGITAL SCREENING BILATERAL   Follow up          Meds ordered this encounter  Medications  . esomeprazole (NEXIUM) 20 MG capsule    Sig: Take 1 capsule (20 mg total) by mouth as needed.    Dispense:  30 capsule    Refill:  11    Follow-up: Return in about 6 months (around 12/26/2020).    Ellie Lunch  Clovis Riley, FNP

## 2020-07-02 ENCOUNTER — Encounter: Payer: Self-pay | Admitting: Family Medicine

## 2020-07-03 ENCOUNTER — Other Ambulatory Visit: Payer: Self-pay

## 2020-07-03 ENCOUNTER — Ambulatory Visit (INDEPENDENT_AMBULATORY_CARE_PROVIDER_SITE_OTHER): Payer: 59 | Admitting: Pharmacist Clinician (PhC)/ Clinical Pharmacy Specialist

## 2020-07-03 DIAGNOSIS — I10 Essential (primary) hypertension: Secondary | ICD-10-CM | POA: Diagnosis not present

## 2020-07-03 MED ORDER — CARVEDILOL 6.25 MG PO TABS
6.2500 mg | ORAL_TABLET | Freq: Two times a day (BID) | ORAL | 3 refills | Status: DC
Start: 2020-07-03 — End: 2020-08-07

## 2020-07-03 MED FILL — CARVEDILOL 6.25 MG TABLET: 6.25 | 30 days supply | Qty: 60 | Fill #0

## 2020-07-03 NOTE — Assessment & Plan Note (Signed)
Patient with what appears to be mainly a diastolic hypertension.  She is already on maximum doses of ARB and chlorthalidone.  Would like to add amlodipine, however she did develop LEE with that.  Instead will try carvedilol 6.25 mg twice daily.  She was given information on correct BP technique and asked to check twice daily for the next month, when she returns for a follow up visit.  She will need to have metabolic panel at that time, to be sure renal function back to baseline.

## 2020-07-03 NOTE — Patient Instructions (Signed)
Return for a a follow up appointment Sept 14 at 11:30  Check your blood pressure at home daily (if able) and keep record of the readings.  Take your BP meds as follows:  Start carvedilol 6.25 mg twice daily  Continue with all other medications  Bring all of your meds, your BP cuff and your record of home blood pressures to your next appointment.  Exercise as you're able, try to walk approximately 30 minutes per day.  Keep salt intake to a minimum, especially watch canned and prepared boxed foods.  Eat more fresh fruits and vegetables and fewer canned items.  Avoid eating in fast food restaurants.    HOW TO TAKE YOUR BLOOD PRESSURE: . Rest 5 minutes before taking your blood pressure. .  Don't smoke or drink caffeinated beverages for at least 30 minutes before. . Take your blood pressure before (not after) you eat. . Sit comfortably with your back supported and both feet on the floor (don't cross your legs). . Elevate your arm to heart level on a table or a desk. . Use the proper sized cuff. It should fit smoothly and snugly around your bare upper arm. There should be enough room to slip a fingertip under the cuff. The bottom edge of the cuff should be 1 inch above the crease of the elbow. . Ideally, take 3 measurements at one sitting and record the average.

## 2020-07-03 NOTE — Progress Notes (Signed)
07/03/2020 Kimberly Herman Feb 04, 1966 [id number]   HPI:  Kimberly Herman is a 54 y.o. female patient of Dr Kimberly Herman, with a PMH below who presents today for hypertension clinic evaluation.  She was last seen by Kimberly Sharp PA, virtually, in June and Herman to have a BP of 128/79.  At that time she was on losartan, chlorthalidone and spironolactone.  Labs drawn shortly after her visit showed a worsening renal function and the spironolactone was discontinued.  Losartan was switched to candesartan.    She returns today for follow up.  Has been checking home BP readings, however forgot the list at home.  See below for patient recall of high and low readings.  She notes that she took amlodipine in the past, however this caused her to have lower extremity edema and was discontinued.  She is also a little frustrated that with her monitoring of diet and trying to exercise more, her pressure did not drop significantly and she needs further medication.  Based on her recall of home readings, she is mainly having diastolic concerns.     Past Medical History: DM2 A1c 5.6 (was 9.0 09/2019) - on glipizide 10 mg bid, Novolog, Lantus, metformin  hyperlipidemia TC 151, TG 155, HDL 46, LDL 78 - on atorvastatin 40     Blood Pressure Goal:  130/80  Current Medications: candesartan 32 mg hs, chlorthalidone 25 mg am,   Family Hx: both parents deceased, mother from lung cancer, father old age at 79; siblings and children healthy, one son deceased motorcycle accident  Social Hx: occasionall wine, no caffeine, no tobacco  Diet: tries to follow diabetic diet, lots of vegetables, has cut back on red meat, more chicken, [place] and fish; tries to eat pastas and breads in moderation;   Exercise: membership to [employer], walks on treadmill, some upper body; tries to go at least twice a week;   Home BP readings: forgot list at home, in past 2 weeks thinks highest at 148/90; lowest 126/82  Intolerances: spironolactone  caused increase in SCr  Labs: 05/08/20:  Na 141, K 4.1, Glu 198, BUN 24, SCr 1.4 GFR 48  Wt Readings from Last 3 Encounters:  07/03/20 198 lb (89.8 kg)  06/25/20 198 lb 9.6 oz (90.1 kg)  05/07/20 195 lb (88.5 kg)   BP Readings from Last 3 Encounters:  07/03/20 140/82  06/25/20 136/76  05/07/20 128/79   Pulse Readings from Last 3 Encounters:  07/03/20 74  06/25/20 76  04/15/20 73    Current Outpatient Medications  Medication Sig Dispense Refill  . atorvastatin (LIPITOR) 40 MG tablet TAKE 1 TABLET (40 MG TOTAL) BY MOUTH DAILY. 30 tablet 1  . blood glucose meter kit and supplies KIT Dispense based on patient and insurance preference. Use up to four times daily as directed. (FOR ICD-9 250.00, 250.01). 1 each 0  . Blood Pressure Monitoring (BLOOD PRESSURE CUFF) MISC Check blood pressure once daily at the same time and record readings 1 each 0  . candesartan (ATACAND) 32 MG tablet Take 1 tablet (32 mg total) by mouth daily. 30 tablet 11  . chlorthalidone (HYGROTON) 25 MG tablet TAKE 1 TABLET (25 MG TOTAL) BY MOUTH DAILY. 30 tablet 0  . esomeprazole (NEXIUM) 20 MG capsule Take 1 capsule (20 mg total) by mouth as needed. 30 capsule 11  . glipiZIDE (GLUCOTROL) 10 MG tablet Take 1 tablet (10 mg total) by mouth 2 (two) times daily before a meal. 60 tablet 11  .  glucose blood (TRUE METRIX BLOOD GLUCOSE TEST) test strip TEST UP TO FOUR TIMES DAILY 100 each 0  . metFORMIN (GLUCOPHAGE) 1000 MG tablet Take 1 tablet (1,000 mg total) by mouth 2 (two) times daily with a meal. 180 tablet 1  . insulin aspart (NOVOLOG) 100 UNIT/ML injection Inject 10 Units into the skin as needed for high blood sugar (blood sugar >240). (Patient not taking: Reported on 06/25/2020) 10 mL 3  . Insulin Glargine (LANTUS SOLOSTAR) 100 UNIT/ML Solostar Pen Inject 20 Units into the skin daily at 10 pm. (Patient not taking: Reported on 06/25/2020) 15 mL 3  . ondansetron (ZOFRAN ODT) 4 MG disintegrating tablet Take 1 tablet (4 mg  total) by mouth every 8 (eight) hours as needed. (Patient not taking: Reported on 06/25/2020) 10 tablet 0   No current facility-administered medications for this visit.    Allergies  Allergen Reactions  . Amlodipine Swelling    Past Medical History:  Diagnosis Date  . Diabetes mellitus without complication ([place])   . Hypertension   . Menopause 2015  . Vaginal polyp 10/2019  . Vitamin D deficiency 08/2019    Blood pressure 140/82, pulse 74, resp. rate 16, height '5\' 7"'  (1.702 m), weight 198 lb (89.8 kg), SpO2 98 %.  HTN (hypertension) Patient with what appears to be mainly a diastolic hypertension.  She is already on maximum doses of ARB and chlorthalidone.  Would like to add amlodipine, however she did develop LEE with that.  Instead will try carvedilol 6.25 mg twice daily.  She was given information on correct BP technique and asked to check twice daily for the next month, when she returns for a follow up visit.  She will need to have metabolic panel at that time, to be sure renal function back to baseline.    Kimberly Herman PharmD CPP [facility] [address] [place], [place] [address] [phone]

## 2020-07-14 ENCOUNTER — Other Ambulatory Visit: Payer: Self-pay | Admitting: Family Medicine

## 2020-07-14 ENCOUNTER — Telehealth: Payer: Self-pay

## 2020-07-14 DIAGNOSIS — K219 Gastro-esophageal reflux disease without esophagitis: Secondary | ICD-10-CM

## 2020-07-14 MED ORDER — OMEPRAZOLE 20 MG PO CPDR: 20.0000 mg | DELAYED_RELEASE_CAPSULE | Freq: Every day | ORAL | 11 refills | Status: DC

## 2020-07-14 MED FILL — OMEPRAZOLE 20 MG CAP: 20 | 30 days supply | Qty: 30 | Fill #0

## 2020-07-14 NOTE — Telephone Encounter (Signed)
-----   Message from Azzie Glatter, [place] sent at 07/02/2020 10:29 PM EDT ----- Regarding: "Cologuard Testing" Kimberly Herman, this patient does not want to get a Colonoscopy. However, she does want to be screened for Colon Cancer. I am unsure as to how to order Cologuard testing, as it is not clinic collected. Please refer to Cologuard Brochure that is located over your current working space in the front office. If you do not see any brochures in one of the baskets overhead, there are also brochures in the break room. Ordering this test has changed in the last few years, and I am not sure as to how to order. Could you please contact Customer Service at [facility] to get these instructions? If there is anything that I can do on my end, let me know. Patient may also need to contact their customer service. Mickel Baas may also be able to assist you with this. Please follow up with patient. Thank you.

## 2020-07-22 ENCOUNTER — Encounter: Payer: 59 | Admitting: Family Medicine

## 2020-08-01 ENCOUNTER — Ambulatory Visit
Admission: RE | Admit: 2020-08-01 | Discharge: 2020-08-01 | Disposition: A | Discharge status: Home / Self Care | Payer: 59 | Source: Ambulatory Visit | Attending: Family Medicine | Admitting: Family Medicine

## 2020-08-01 ENCOUNTER — Other Ambulatory Visit: Payer: Self-pay

## 2020-08-01 DIAGNOSIS — Z1231 Encounter for screening mammogram for malignant neoplasm of breast: Secondary | ICD-10-CM

## 2020-08-05 ENCOUNTER — Ambulatory Visit: Payer: 59

## 2020-08-06 ENCOUNTER — Ambulatory Visit: Payer: 59

## 2020-08-06 NOTE — Progress Notes (Deleted)
HPI:  Kimberly Herman is a 54 y.o. female patient of Dr Gwenlyn Found, with a PMH below who presents today for hypertension clinic follow up.  She was last seen by Fabian Sharp PA, virtually, in June and found to have a BP of 128/79.  At that time she was on losartan, chlorthalidone and spironolactone.  Labs drawn shortly after her visit showed a worsening renal function and the spironolactone was discontinued.  Losartan was switched to candesartan.     She notes that she took amlodipine in the past, however this caused her to have lower extremity edema and was discontinued. Carvedilol 6.61m BID was added to her regimen during last OV on 06/25/2020.  Past Medical History: DM2 A1c 5.6 (was 9.0 09/2019) - on glipizide 10 mg bid, Novolog, Lantus, metformin  hyperlipidemia TC 151, TG 155, HDL 46, LDL 78 - on atorvastatin 40     Blood Pressure Goal:  130/80  Current Medications:  candesartan 32 mg at bedtime  chlorthalidone 25 mg am  carvedilol 6.237mtwice daily  Family Hx: both parents deceased, mother from lung cancer, father old age at 927siblings and children healthy, one son deceased motorcycle accident  Social Hx: occasionall wine, no caffeine, no tobacco  Diet: tries to follow diabetic diet, lots of vegetables, has cut back on red meat, more chicken, [place] fish; tries to eat pastas and breads in moderation;   Exercise: membership to [employer] on treadmill, some upper body; tries to go at least twice a week;   Home BP readings:   Intolerances: spironolactone caused increase in SCr  Labs: 05/08/20:  Na 141, K 4.1, Glu 198, BUN 24, SCr 1.4 GFR 48  Wt Readings from Last 3 Encounters:  07/03/20 198 lb (89.8 kg)  06/25/20 198 lb 9.6 oz (90.1 kg)  05/07/20 195 lb (88.5 kg)   BP Readings from Last 3 Encounters:  07/03/20 140/82  06/25/20 136/76  05/07/20 128/79   Pulse Readings from Last 3 Encounters:  07/03/20 74  06/25/20 76  04/15/20 73    Current Outpatient  Medications  Medication Sig Dispense Refill  . atorvastatin (LIPITOR) 40 MG tablet TAKE 1 TABLET (40 MG TOTAL) BY MOUTH DAILY. 30 tablet 1  . blood glucose meter kit and supplies KIT Dispense based on patient and insurance preference. Use up to four times daily as directed. (FOR ICD-9 250.00, 250.01). 1 each 0  . Blood Pressure Monitoring (BLOOD PRESSURE CUFF) MISC Check blood pressure once daily at the same time and record readings 1 each 0  . candesartan (ATACAND) 32 MG tablet Take 1 tablet (32 mg total) by mouth daily. 30 tablet 11  . carvedilol (COREG) 6.25 MG tablet Take 1 tablet (6.25 mg total) by mouth 2 (two) times daily. 60 tablet 3  . chlorthalidone (HYGROTON) 25 MG tablet TAKE 1 TABLET (25 MG TOTAL) BY MOUTH DAILY. 30 tablet 0  . esomeprazole (NEXIUM) 20 MG capsule Take 1 capsule (20 mg total) by mouth as needed. 30 capsule 11  . glipiZIDE (GLUCOTROL) 10 MG tablet Take 1 tablet (10 mg total) by mouth 2 (two) times daily before a meal. 60 tablet 11  . glucose blood (TRUE METRIX BLOOD GLUCOSE TEST) test strip TEST UP TO FOUR TIMES DAILY 100 each 0  . insulin aspart (NOVOLOG) 100 UNIT/ML injection Inject 10 Units into the skin as needed for high blood sugar (blood sugar >240). (Patient not taking: Reported on 06/25/2020) 10 mL 3  .  Insulin Glargine (LANTUS SOLOSTAR) 100 UNIT/ML Solostar Pen Inject 20 Units into the skin daily at 10 pm. (Patient not taking: Reported on 06/25/2020) 15 mL 3  . metFORMIN (GLUCOPHAGE) 1000 MG tablet Take 1 tablet (1,000 mg total) by mouth 2 (two) times daily with a meal. 180 tablet 1  . omeprazole (PRILOSEC) 20 MG capsule Take 1 capsule (20 mg total) by mouth daily. 30 capsule 11  . ondansetron (ZOFRAN ODT) 4 MG disintegrating tablet Take 1 tablet (4 mg total) by mouth every 8 (eight) hours as needed. (Patient not taking: Reported on 06/25/2020) 10 tablet 0   No current facility-administered medications for this visit.    Allergies  Allergen Reactions  .  Amlodipine Swelling    Past Medical History:  Diagnosis Date  . Diabetes mellitus without complication ([place])   . Hypertension   . Menopause 2015  . Vaginal polyp 10/2019  . Vitamin D deficiency 08/2019    There were no vitals taken for this visit.  No problem-specific Assessment & Plan notes found for this encounter.   Jaretzy Lhommedieu Rodriguez-Guzman PharmD, BCPS, [place] [address] 08/06/2020 2:42 PM

## 2020-08-07 ENCOUNTER — Ambulatory Visit (INDEPENDENT_AMBULATORY_CARE_PROVIDER_SITE_OTHER): Payer: 59 | Admitting: Pharmacist Clinician (PhC)/ Clinical Pharmacy Specialist

## 2020-08-07 ENCOUNTER — Other Ambulatory Visit: Payer: Self-pay

## 2020-08-07 DIAGNOSIS — I10 Essential (primary) hypertension: Secondary | ICD-10-CM | POA: Diagnosis not present

## 2020-08-07 NOTE — Progress Notes (Signed)
08/11/2020 Kimberly Herman May 17, 1966 [id number]   HPI:  Kimberly Herman is a 54 y.o. female patient of Dr Gwenlyn Found, with a PMH below who presents today for hypertension clinic evaluation.  She was last seen by Fabian Sharp PA, virtually, in June and found to have a BP of 128/79.  At that time she was on losartan, chlorthalidone and spironolactone.  Labs drawn shortly after her visit showed a worsening renal function and the spironolactone was discontinued.  Losartan was switched to candesartan.  At her last visit she did not have any home readings with her.  She thought the highest had been about 810 systolic, however diastolic readings were mostly 80-95.  At that time we added carvedilol 6.25 mg bid.  She only took this for a short time as it caused her to have diarrhea.   She returns today for follow up.  She has some readings with her today - see below.  Despite not adding the carvedilol, her blood pressure has dropped nicely.  She has been working on her dietary habits and has recently started juicing.    9 morning readings 125/79  3 evening 127/72  Started carvedilol at last visit, caused diarrhea  Past Medical History: DM2 A1c 5.6 (was 9.0 09/2019) - on glipizide 10 mg bid, Novolog, Lantus, metformin  hyperlipidemia TC 151, TG 155, HDL 46, LDL 78 - on atorvastatin 40     Blood Pressure Goal:  130/80  Current Medications: candesartan 32 mg hs, chlorthalidone 25 mg am,   Family Hx: both parents deceased, mother from lung cancer, father old age at 21; siblings and children healthy, one son deceased motorcycle accident  Social Hx: occasionall wine, no caffeine, no tobacco  Diet: tries to follow diabetic diet, lots of vegetables, has cut back on red meat, more chicken, [place] and fish; tries to eat pastas and breads in moderation;  Has nutri-bullet, looking to get juicer to make celery water   Exercise: membership to [employer], walks on treadmill, some upper body; tries to go at  least twice a week;   Home BP readings: 9 morning readings average 125/79  3 evening readings average 127/72  Intolerances: spironolactone caused increase in SCr, amlodipine caused LEE, carvedilol caused diarrhea  Labs: 05/08/20:  Na 141, K 4.1, Glu 198, BUN 24, SCr 1.4 GFR 48  Wt Readings from Last 3 Encounters:  08/07/20 205 lb 6.4 oz (93.2 kg)  07/03/20 198 lb (89.8 kg)  06/25/20 198 lb 9.6 oz (90.1 kg)   BP Readings from Last 3 Encounters:  08/07/20 (!) 150/92  07/03/20 140/82  06/25/20 136/76   Pulse Readings from Last 3 Encounters:  08/07/20 71  07/03/20 74  06/25/20 76    Current Outpatient Medications  Medication Sig Dispense Refill  . atorvastatin (LIPITOR) 40 MG tablet TAKE 1 TABLET (40 MG TOTAL) BY MOUTH DAILY. 30 tablet 1  . blood glucose meter kit and supplies KIT Dispense based on patient and insurance preference. Use up to four times daily as directed. (FOR ICD-9 250.00, 250.01). 1 each 0  . Blood Pressure Monitoring (BLOOD PRESSURE CUFF) MISC Check blood pressure once daily at the same time and record readings 1 each 0  . candesartan (ATACAND) 32 MG tablet Take 1 tablet (32 mg total) by mouth daily. 30 tablet 11  . esomeprazole (NEXIUM) 20 MG capsule Take 1 capsule (20 mg total) by mouth as needed. 30 capsule 11  . glipiZIDE (GLUCOTROL) 10 MG tablet Take  1 tablet (10 mg total) by mouth 2 (two) times daily before a meal. 60 tablet 11  . glucose blood (TRUE METRIX BLOOD GLUCOSE TEST) test strip TEST UP TO FOUR TIMES DAILY 100 each 0  . insulin aspart (NOVOLOG) 100 UNIT/ML injection Inject 10 Units into the skin as needed for high blood sugar (blood sugar >240). 10 mL 3  . Insulin Glargine (LANTUS SOLOSTAR) 100 UNIT/ML Solostar Pen Inject 20 Units into the skin daily at 10 pm. 15 mL 3  . metFORMIN (GLUCOPHAGE) 1000 MG tablet Take 1 tablet (1,000 mg total) by mouth 2 (two) times daily with a meal. 180 tablet 1  . omeprazole (PRILOSEC) 20 MG capsule Take 1 capsule (20 mg  total) by mouth daily. 30 capsule 11  . ondansetron (ZOFRAN ODT) 4 MG disintegrating tablet Take 1 tablet (4 mg total) by mouth every 8 (eight) hours as needed. 10 tablet 0  . chlorthalidone (HYGROTON) 25 MG tablet TAKE 1 TABLET BY MOUTH DAILY. 30 tablet 11   No current facility-administered medications for this visit.    Allergies  Allergen Reactions  . Amlodipine Swelling  . Carvedilol Diarrhea    Past Medical History:  Diagnosis Date  . Diabetes mellitus without complication ([place])   . Hypertension   . Menopause 2015  . Vaginal polyp 10/2019  . Vitamin D deficiency 08/2019    Blood pressure (!) 150/92, pulse 71, resp. rate 15, weight 205 lb 6.4 oz (93.2 kg), SpO2 96 %.  HTN (hypertension) Patient with essential hypertension, now doing better hopefully in part to better eating habits.  She should continue with her current medications and regular home BP monitoring.  She is due to see Dr. Gwenlyn Found in December, so will have her follow up with him at that time.  Should she find her home readings increasing before then, she knows to call the office.     Tommy Medal PharmD CPP [facility] [address] [place], [place] [address] [phone]

## 2020-08-07 NOTE — Patient Instructions (Signed)
Schedule appointment with Dr. Gwenlyn Found for December  Check your blood pressure at home 3-4 days each week and keep record of the readings.  Take your BP meds as follows:  Continue with all current medications  Bring all of your meds, your BP cuff and your record of home blood pressures to your next appointment.  Exercise as youre able, try to walk approximately 30 minutes per day.  Keep salt intake to a minimum, especially watch canned and prepared boxed foods.  Eat more fresh fruits and vegetables and fewer canned items.  Avoid eating in fast food restaurants.    HOW TO TAKE YOUR BLOOD PRESSURE:  Rest 5 minutes before taking your blood pressure.   Dont smoke or drink caffeinated beverages for at least 30 minutes before.  Take your blood pressure before (not after) you eat.  Sit comfortably with your back supported and both feet on the floor (dont cross your legs).  Elevate your arm to heart level on a table or a desk.  Use the proper sized cuff. It should fit smoothly and snugly around your bare upper arm. There should be enough room to slip a fingertip under the cuff. The bottom edge of the cuff should be 1 inch above the crease of the elbow.  Ideally, take 3 measurements at one sitting and record the average.

## 2020-08-08 LAB — COLOGUARD: COLOGUARD: NEGATIVE

## 2020-08-11 ENCOUNTER — Other Ambulatory Visit (INDEPENDENT_AMBULATORY_CARE_PROVIDER_SITE_OTHER): Payer: Self-pay | Admitting: Primary Care

## 2020-08-11 ENCOUNTER — Other Ambulatory Visit (INDEPENDENT_AMBULATORY_CARE_PROVIDER_SITE_OTHER): Payer: Self-pay | Admitting: Family Medicine

## 2020-08-11 ENCOUNTER — Other Ambulatory Visit: Payer: Self-pay | Admitting: Family Medicine

## 2020-08-11 DIAGNOSIS — I10 Essential (primary) hypertension: Secondary | ICD-10-CM

## 2020-08-11 MED FILL — CANDESARTAN CILEXETIL 32 MG: 32 | 30 days supply | Qty: 30 | Fill #2

## 2020-08-11 MED FILL — glipiZIDE 10 MG TABS: 10 | 30 days supply | Qty: 60 | Fill #1

## 2020-08-11 MED FILL — CHLORTHALIDONE 25 MG TAB: 25 | 30 days supply | Qty: 30 | Fill #0

## 2020-08-11 NOTE — Progress Notes (Signed)
Pt was called to discuss her lab results. Pt stated she understood her lab results and will keep her f/u appt.

## 2020-08-11 NOTE — Telephone Encounter (Signed)
Sent to patients PCP to refill if appropriate.

## 2020-08-11 NOTE — Assessment & Plan Note (Signed)
Patient with essential hypertension, now doing better hopefully in part to better eating habits.  She should continue with her current medications and regular home BP monitoring.  She is due to see Dr. Gwenlyn Found in December, so will have her follow up with him at that time.  Should she find her home readings increasing before then, she knows to call the office.

## 2020-09-19 MED FILL — glipiZIDE 10 MG TABS: 10 | 30 days supply | Qty: 60 | Fill #2

## 2020-09-19 MED FILL — METFORMIN HCL 500 MG TABS: 500 | 30 days supply | Qty: 90 | Fill #1

## 2020-09-19 MED FILL — CHLORTHALIDONE 25 MG TAB: 25 | 30 days supply | Qty: 30 | Fill #1

## 2020-10-20 ENCOUNTER — Other Ambulatory Visit: Payer: Self-pay | Admitting: Family Medicine

## 2020-10-20 MED FILL — ATORVASTATIN CALCIUM 40 MG: 40 | 30 days supply | Qty: 30 | Fill #0

## 2020-10-24 ENCOUNTER — Other Ambulatory Visit: Payer: Self-pay | Admitting: Family Medicine

## 2020-10-24 DIAGNOSIS — Z794 Long term (current) use of insulin: Secondary | ICD-10-CM

## 2020-10-24 MED FILL — CANDESARTAN CILEXETIL 32 MG: 32 | 30 days supply | Qty: 30 | Fill #3

## 2020-10-24 MED FILL — CHLORTHALIDONE 25 MG TAB: 25 | 30 days supply | Qty: 30 | Fill #2

## 2020-10-28 NOTE — Telephone Encounter (Signed)
Please see refill request.

## 2020-10-29 ENCOUNTER — Other Ambulatory Visit: Payer: Self-pay | Admitting: Family Medicine

## 2020-10-29 DIAGNOSIS — Z794 Long term (current) use of insulin: Secondary | ICD-10-CM

## 2020-10-29 DIAGNOSIS — E119 Type 2 diabetes mellitus without complications: Secondary | ICD-10-CM

## 2020-10-29 MED ORDER — METFORMIN HCL 1000 MG PO TABS: 1000.0000 mg | ORAL_TABLET | Freq: Two times a day (BID) | ORAL | 3 refills | Status: DC

## 2020-10-29 MED FILL — METFORMIN HCL 1000 MG TABS: 1000 | 30 days supply | Qty: 60 | Fill #0

## 2020-11-07 ENCOUNTER — Ambulatory Visit: Payer: 59 | Admitting: Cardiovascular Disease

## 2020-11-11 MED FILL — METFORMIN HCL 1000 MG TABS: 1000 | 30 days supply | Qty: 60 | Fill #0

## 2020-12-15 MED FILL — METFORMIN HCL 1000 MG TABS: 1000 | 30 days supply | Qty: 60 | Fill #1

## 2020-12-15 MED FILL — glipiZIDE 10 MG TABS: 10 | 30 days supply | Qty: 60 | Fill #3

## 2020-12-15 MED FILL — CHLORTHALIDONE 25 MG TAB: 25 | 30 days supply | Qty: 30 | Fill #3

## 2020-12-15 MED FILL — ATORVASTATIN CALCIUM 40 MG: 40 | 30 days supply | Qty: 30 | Fill #1

## 2020-12-26 ENCOUNTER — Ambulatory Visit: Payer: 59 | Admitting: Family Medicine

## 2021-01-02 ENCOUNTER — Other Ambulatory Visit: Payer: Self-pay

## 2021-01-02 ENCOUNTER — Encounter: Payer: Self-pay | Admitting: Cardiovascular Disease

## 2021-01-02 ENCOUNTER — Ambulatory Visit (INDEPENDENT_AMBULATORY_CARE_PROVIDER_SITE_OTHER): Payer: 59 | Admitting: Cardiovascular Disease

## 2021-01-02 DIAGNOSIS — E782 Mixed hyperlipidemia: Secondary | ICD-10-CM | POA: Diagnosis not present

## 2021-01-02 DIAGNOSIS — I10 Essential (primary) hypertension: Secondary | ICD-10-CM | POA: Diagnosis not present

## 2021-01-02 NOTE — Assessment & Plan Note (Signed)
History of essential hypertension blood pressure measured 140/81.  She was on candesartan in the past where she has been out of this.  She currently is only on chlorthalidone.  We will check a basic metabolic panel today.

## 2021-01-02 NOTE — Patient Instructions (Signed)
Medication Instructions:  Your physician recommends that you continue on your current medications as directed. Please refer to the Current Medication list given to you today.  *If you need a refill on your cardiac medications before your next appointment, please call your pharmacy*  Lab Work: BMET today  If you have labs (blood work) drawn today and your tests are completely normal, you will receive your results only by: [name] MyChart Message (if you have MyChart) OR . A paper copy in the mail If you have any lab test that is abnormal or we need to change your treatment, we will call you to review the results.  Follow-Up: At [facility], you and your health needs are our priority.  As part of our continuing mission to provide you with exceptional heart care, we have created designated Provider Care Teams.  These Care Teams include your primary Cardiologist (physician) and Advanced Practice Providers (APPs -  Physician Assistants and Nurse Practitioners) who all work together to provide you with the care you need, when you need it.  We recommend signing up for the patient portal called "MyChart".  Sign up information is provided on this After Visit Summary.  MyChart is used to connect with patients for Virtual Visits (Telemedicine).  Patients are able to view lab/test results, encounter notes, upcoming appointments, etc.  Non-urgent messages can be sent to your provider as well.   To learn more about what you can do with MyChart, go to [email or web address].    Your next appointment:   12 month(s)  The format for your next appointment:   In Person  Provider:   Quay Burow, MD

## 2021-01-02 NOTE — Assessment & Plan Note (Signed)
History of dyspnea on exertion and atypical chest pain when I saw her several years ago.  2D echo performed 01/04/2018 revealed normal LV systolic function with grade 1 diastolic dysfunction.  There were no other abnormalities.  A Myoview stress test was also performed 12/30/2017 that showed no evidence of ischemia.  Her symptoms have since resolved.

## 2021-01-02 NOTE — Progress Notes (Signed)
01/02/2021 Kimberly Herman   07-31-1966  [id number]  Primary Physician Azzie Glatter, FNP Primary Cardiologist: Lorretta Harp MD Kimberly Herman, [place]  HPI:  Kimberly Herman is a 55 y.o.   severely overweight married African-American female mother of 2 living children (42 year old son recently passed away in a motorcycle accident), grandmother to 69 grandchildren who was working doing "in-home health". She is a [employer] manager.  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  I last saw her in the office 12/27/2017.  She was referred by Kimberly Barrows FNP for cardiovascular evaluation because of new-onset shortness of breath. Risk factors include treated hypertension, diabetes and hyperlipidemia. She has never had a heart attack or stroke. She denies chest pain but has had 3-4 episodes of shortness of breath over the last month lasting minutes that time weaning her feeling weak and washed out afterwards.  She saw Kimberly Duke PA-C for hypertension and has been followed up by our Pharm.D. who had adjusted her medications.  She has been off her candesartan for a while with blood pressures in the normal range.  Since I saw her 3 years ago she continues to do well.  She currently denies chest pain or shortness of breath.   Current Meds  Medication Sig  . atorvastatin (LIPITOR) 40 MG tablet TAKE 1 TABLET (40 MG TOTAL) BY MOUTH DAILY.  . blood glucose meter kit and supplies KIT Dispense based on patient and insurance preference. Use up to four times daily as directed. (FOR ICD-9 250.00, 250.01).  . Blood Pressure Monitoring (BLOOD PRESSURE CUFF) MISC Check blood pressure once daily at the same time and record readings  . candesartan (ATACAND) 32 MG tablet Take 1 tablet (32 mg total) by mouth daily.  . chlorthalidone (HYGROTON) 25 MG tablet TAKE 1 TABLET BY MOUTH DAILY.  [name] esomeprazole (NEXIUM) 20 MG capsule Take 1 capsule (20 mg total) by mouth as needed.  [name] glipiZIDE (GLUCOTROL) 10 MG tablet Take 1 tablet  (10 mg total) by mouth 2 (two) times daily before a meal.  . glucose blood (TRUE METRIX BLOOD GLUCOSE TEST) test strip TEST UP TO FOUR TIMES DAILY  . insulin aspart (NOVOLOG) 100 UNIT/ML injection Inject 10 Units into the skin as needed for high blood sugar (blood sugar >240).  . Insulin Glargine (LANTUS SOLOSTAR) 100 UNIT/ML Solostar Pen Inject 20 Units into the skin daily at 10 pm.  . metFORMIN (GLUCOPHAGE) 1000 MG tablet Take 1 tablet (1,000 mg total) by mouth 2 (two) times daily with a meal.  . omeprazole (PRILOSEC) 20 MG capsule Take 1 capsule (20 mg total) by mouth daily.  . ondansetron (ZOFRAN ODT) 4 MG disintegrating tablet Take 1 tablet (4 mg total) by mouth every 8 (eight) hours as needed.     Allergies  Allergen Reactions  . Amlodipine Swelling  . Carvedilol Diarrhea    Social History   Socioeconomic History  . Marital status: Single    Spouse name: Not on file  . Number of children: Not on file  . Years of education: Not on file  . Highest education level: Not on file  Occupational History  . Not on file  Tobacco Use  . Smoking status: Never Smoker  . Smokeless tobacco: Never Used  Vaping Use  . Vaping Use: Never used  Substance and Sexual Activity  . Alcohol use: Not Currently  . Drug use: No  . Sexual activity: Yes  Other Topics Concern  . Not  on file  Social History Narrative   Patient recently lost youngest son he was killed in a motor cycle accident at age 51. June 2018   Social Determinants of Health   Emergency planning/management officer Strain: Not on file  Food Insecurity: Not on file  Transportation Needs: Not on file  Physical Activity: Not on file  Stress: Not on file  Social Connections: Not on file  Intimate Partner Violence: Not on file     Review of Systems: General: negative for chills, fever, night sweats or weight changes.  Cardiovascular: negative for chest pain, dyspnea on exertion, edema, orthopnea, palpitations, paroxysmal nocturnal dyspnea or  shortness of breath Dermatological: negative for rash Respiratory: negative for cough or wheezing Urologic: negative for hematuria Abdominal: negative for nausea, vomiting, diarrhea, bright red blood per rectum, melena, or hematemesis Neurologic: negative for visual changes, syncope, or dizziness All other systems reviewed and are otherwise negative except as noted above.    Blood pressure 140/81, pulse 73, height '5\' 7"'  (1.702 m), weight 199 lb 9.6 oz (90.5 kg), SpO2 97 %.  General appearance: alert and no distress Neck: no adenopathy, no carotid bruit, no JVD, supple, symmetrical, trachea midline and thyroid not enlarged, symmetric, no tenderness/mass/nodules Lungs: clear to auscultation bilaterally Heart: regular rate and rhythm, S1, S2 normal, no murmur, click, rub or gallop Extremities: extremities normal, atraumatic, no cyanosis or edema Pulses: 2+ and symmetric Skin: Skin color, texture, turgor normal. No rashes or lesions Neurologic: Alert and oriented X 3, normal strength and tone. Normal symmetric reflexes. Normal coordination and gait  EKG sinus rhythm at 73 without ST or T wave changes.  I personally reviewed this EKG.  ASSESSMENT AND PLAN:   HTN (hypertension) History of essential hypertension blood pressure measured 140/81.  She was on candesartan in the past where she has been out of this.  She currently is only on chlorthalidone.  We will check a basic metabolic panel today.  Hyperlipidemia History of hyperlipidemia on statin therapy lipid profile performed 05/08/2020 revealing total cholesterol 151, LDL 78 and HDL 46.  Shortness of breath History of dyspnea on exertion and atypical chest pain when I saw her several years ago.  2D echo performed 01/04/2018 revealed normal LV systolic function with grade 1 diastolic dysfunction.  There were no other abnormalities.  A Myoview stress test was also performed 12/30/2017 that showed no evidence of ischemia.  Her symptoms have  since resolved.      Lorretta Harp MD FACP,FACC,FAHA, [facility] 01/02/2021 4:34 PM

## 2021-01-02 NOTE — Assessment & Plan Note (Signed)
History of hyperlipidemia on statin therapy lipid profile performed 05/08/2020 revealing total cholesterol 151, LDL 78 and HDL 46.

## 2021-01-03 LAB — BASIC METABOLIC PANEL
BUN/Creatinine Ratio: 14 (ref 9–23)
BUN: 16 mg/dL (ref 6–24)
CO2: 27 mmol/L (ref 20–29)
Calcium: 9.6 mg/dL (ref 8.7–10.2)
Chloride: 104 mmol/L (ref 96–106)
Creatinine, Ser: 1.11 mg/dL — ABNORMAL HIGH (ref 0.57–1.00)
GFR calc Af Amer: 64 mL/min/{1.73_m2} (ref >59)
GFR calc non Af Amer: 56 mL/min/{1.73_m2} — ABNORMAL LOW (ref >59)
Glucose: 108 mg/dL — ABNORMAL HIGH (ref 65–99)
Potassium: 3.8 mmol/L (ref 3.5–5.2)
Sodium: 147 mmol/L — ABNORMAL HIGH (ref 134–144)

## 2021-01-27 MED FILL — CHLORTHALIDONE 25 MG TAB: 25 | 30 days supply | Qty: 30 | Fill #4

## 2021-02-18 MED FILL — METFORMIN HCL 1000 MG TABS: 1000 | 30 days supply | Qty: 60 | Fill #2

## 2021-02-21 ENCOUNTER — Other Ambulatory Visit: Payer: Self-pay

## 2021-03-11 ENCOUNTER — Other Ambulatory Visit: Payer: Self-pay

## 2021-03-11 MED FILL — Chlorthalidone Tab 25 MG: ORAL | 30 days supply | Qty: 30 | Fill #0 | Status: AC

## 2021-03-12 ENCOUNTER — Other Ambulatory Visit: Payer: Self-pay

## 2021-03-13 ENCOUNTER — Other Ambulatory Visit (HOSPITAL_COMMUNITY): Payer: Self-pay

## 2021-04-28 ENCOUNTER — Other Ambulatory Visit: Payer: Self-pay

## 2021-04-28 MED FILL — Glipizide Tab 10 MG: ORAL | 30 days supply | Qty: 60 | Fill #0 | Status: AC

## 2021-04-28 MED FILL — Metformin HCl Tab 1000 MG: ORAL | 90 days supply | Qty: 180 | Fill #0 | Status: AC

## 2021-04-28 MED FILL — Chlorthalidone Tab 25 MG: ORAL | 30 days supply | Qty: 30 | Fill #1 | Status: AC

## 2021-06-19 ENCOUNTER — Other Ambulatory Visit: Payer: Self-pay

## 2021-06-19 MED FILL — Chlorthalidone Tab 25 MG: ORAL | 30 days supply | Qty: 30 | Fill #2 | Status: AC

## 2021-06-22 ENCOUNTER — Other Ambulatory Visit: Payer: Self-pay

## 2021-07-01 ENCOUNTER — Other Ambulatory Visit: Payer: Self-pay

## 2021-07-01 ENCOUNTER — Other Ambulatory Visit: Payer: Self-pay | Admitting: Obstetrics and Gynecology

## 2021-07-01 DIAGNOSIS — Z1231 Encounter for screening mammogram for malignant neoplasm of breast: Secondary | ICD-10-CM

## 2021-07-23 ENCOUNTER — Other Ambulatory Visit: Payer: Self-pay | Admitting: Nurse Practitioner

## 2021-07-23 ENCOUNTER — Other Ambulatory Visit: Payer: Self-pay

## 2021-07-23 DIAGNOSIS — E119 Type 2 diabetes mellitus without complications: Secondary | ICD-10-CM

## 2021-07-23 DIAGNOSIS — Z794 Long term (current) use of insulin: Secondary | ICD-10-CM

## 2021-07-23 MED FILL — Chlorthalidone Tab 25 MG: ORAL | 30 days supply | Qty: 30 | Fill #3 | Status: AC

## 2021-07-24 ENCOUNTER — Other Ambulatory Visit: Payer: Self-pay

## 2021-08-05 ENCOUNTER — Other Ambulatory Visit: Payer: Self-pay

## 2021-08-21 ENCOUNTER — Ambulatory Visit
Admission: RE | Admit: 2021-08-21 | Discharge: 2021-08-21 | Disposition: A | Discharge status: Home / Self Care | Payer: Self-pay | Source: Ambulatory Visit | Attending: Obstetrics and Gynecology | Admitting: Obstetrics and Gynecology

## 2021-08-21 DIAGNOSIS — Z1231 Encounter for screening mammogram for malignant neoplasm of breast: Secondary | ICD-10-CM

## 2021-09-18 ENCOUNTER — Inpatient Hospital Stay: Admission: RE | Admit: 2021-09-18 | Payer: Self-pay | Source: Ambulatory Visit

## 2021-09-21 ENCOUNTER — Other Ambulatory Visit: Payer: Self-pay | Admitting: Family Medicine

## 2021-09-21 ENCOUNTER — Other Ambulatory Visit: Payer: Self-pay

## 2021-09-21 ENCOUNTER — Ambulatory Visit: Payer: Self-pay

## 2021-09-21 ENCOUNTER — Other Ambulatory Visit (INDEPENDENT_AMBULATORY_CARE_PROVIDER_SITE_OTHER): Payer: Self-pay | Admitting: Family Medicine

## 2021-09-21 DIAGNOSIS — I10 Essential (primary) hypertension: Secondary | ICD-10-CM

## 2021-09-21 DIAGNOSIS — Z794 Long term (current) use of insulin: Secondary | ICD-10-CM

## 2021-09-21 DIAGNOSIS — E119 Type 2 diabetes mellitus without complications: Secondary | ICD-10-CM

## 2021-09-21 MED FILL — Metformin HCl Tab 1000 MG: ORAL | 90 days supply | Qty: 180 | Fill #1 | Status: CN

## 2021-09-22 ENCOUNTER — Ambulatory Visit: Payer: Self-pay | Admitting: Nurse Practitioner

## 2021-09-28 ENCOUNTER — Ambulatory Visit: Payer: Self-pay | Admitting: Nurse Practitioner

## 2021-09-28 ENCOUNTER — Other Ambulatory Visit: Payer: Self-pay

## 2021-10-01 ENCOUNTER — Other Ambulatory Visit: Payer: Self-pay

## 2021-10-01 ENCOUNTER — Ambulatory Visit (INDEPENDENT_AMBULATORY_CARE_PROVIDER_SITE_OTHER): Payer: Self-pay | Admitting: Nurse Practitioner

## 2021-10-01 ENCOUNTER — Encounter: Payer: Self-pay | Admitting: Nurse Practitioner

## 2021-10-01 VITALS — BP 181/88 | HR 72 | Temp 97.7°F | Ht 67.0 in | Wt 197.0 lb

## 2021-10-01 DIAGNOSIS — E119 Type 2 diabetes mellitus without complications: Secondary | ICD-10-CM

## 2021-10-01 DIAGNOSIS — G629 Polyneuropathy, unspecified: Secondary | ICD-10-CM

## 2021-10-01 DIAGNOSIS — Z794 Long term (current) use of insulin: Secondary | ICD-10-CM

## 2021-10-01 DIAGNOSIS — I10 Essential (primary) hypertension: Secondary | ICD-10-CM

## 2021-10-01 LAB — POCT GLYCOSYLATED HEMOGLOBIN (HGB A1C)
HbA1c POC (<> result, manual entry): 9.3 % (ref 4.0–5.6)
HbA1c, POC (controlled diabetic range): 9.3 % — AB (ref 0.0–7.0)
HbA1c, POC (prediabetic range): 9.3 % — AB (ref 5.7–6.4)
Hemoglobin A1C: 9.3 % — AB (ref 4.0–5.6)

## 2021-10-01 MED ORDER — CHLORTHALIDONE 25 MG PO TABS
25.0000 mg | ORAL_TABLET | Freq: Every day | ORAL | 2 refills | Status: DC
  Filled 2021-10-01: qty 30, 30d supply, fill #0

## 2021-10-01 MED ORDER — GABAPENTIN 300 MG PO CAPS
300.0000 mg | ORAL_CAPSULE | Freq: Three times a day (TID) | ORAL | 2 refills | Status: DC
  Filled 2021-10-01: qty 90, 30d supply, fill #0

## 2021-10-01 MED ORDER — CHLORTHALIDONE 25 MG PO TABS
ORAL_TABLET | Freq: Every day | ORAL | 11 refills | Status: DC
  Filled 2021-10-01: qty 30, 30d supply, fill #0

## 2021-10-01 MED ORDER — GLIPIZIDE 10 MG PO TABS
ORAL_TABLET | Freq: Two times a day (BID) | ORAL | 11 refills | Status: DC
  Filled 2021-10-01: qty 60, 30d supply, fill #0

## 2021-10-01 MED ORDER — METFORMIN HCL 1000 MG PO TABS
1000.0000 mg | ORAL_TABLET | Freq: Two times a day (BID) | ORAL | 2 refills | Status: DC
  Filled 2021-10-01: qty 60, 30d supply, fill #0

## 2021-10-01 MED ORDER — GLIPIZIDE 10 MG PO TABS
10.0000 mg | ORAL_TABLET | Freq: Two times a day (BID) | ORAL | 2 refills | Status: DC
  Filled 2021-10-01: qty 60, 30d supply, fill #0

## 2021-10-01 MED ORDER — ATORVASTATIN CALCIUM 40 MG PO TABS
40.0000 mg | ORAL_TABLET | Freq: Every day | ORAL | 2 refills | Status: DC
  Filled 2021-10-01: qty 30, 30d supply, fill #0

## 2021-10-01 NOTE — Patient Instructions (Signed)
You were seen today in the [facility] for follow up of chronic illness . Labs were collected, results will be available via MyChart or, if abnormal, you will be contacted by clinic staff. You were prescribed medications, please take as directed. Please follow up in 3 mths for reevaluation.

## 2021-10-01 NOTE — Progress Notes (Signed)
Kimberly Herman, [place]  [address] Phone:  [phone]   Fax:  [phone] Subjective:   Patient ID: Kimberly Herman, female    DOB: 06/19/1966, 55 y.o.   MRN: [id number]  Chief Complaint  Patient presents with   Follow-up    Pt is here for her follow up visit. Pt needs refills on her medications. Pt states that she has been having tingling sensations in her toes and fingers only when she is laying down x 6 months.   HPI Kimberly Herman 55 y.o. female  has a past medical history of Diabetes mellitus without complication ([place]), Hypertension, Menopause (2015), Vaginal polyp (10/2019), and Vitamin D deficiency (08/2019). To the [facility] for follow up of chronic illnesses.  Patient states that she is concerned about tingling and sharp needles in toes and fingers that have been occurring at night. States that she has had symptoms x 6 mths. Concerned symptoms related to diabetes diagnosis. Pins and needles feeling typically resolves without intervention. Has not been evaluated for symptoms. Has not taken any OTC and/ prescribed medications for symptoms. Denies any discoloration of extremities or changes in temperature. Verbalizes not being compliant with all medications. Has not taken lipitor, glipizide or prescribed blood pressure medications in longer than 1 mth. Continues to take blood glucose at home. Last fasting blood glucose 235, when she was taking all of her medications, her fasting BG would typically be 135. Requires refill of all medications.  Denies adhering to any diet and/ exercise regimen. Currently works at an [employer] and enjoys her work. Denies any other complaints today. Denies any recent trauma or injury. Denies any fever. Denies any fatigue, chest pain, shortness of breath, HA or dizziness. Denies any blurred vision or numbness.   Past Medical History:  Diagnosis Date   Diabetes mellitus without complication ([place])     Hypertension    Menopause 2015   Vaginal polyp 10/2019   Vitamin D deficiency 08/2019    No past surgical history on file.  Family History  Problem Relation Age of Onset   Cancer Mother     Social History   Socioeconomic History   Marital status: Single    Spouse name: Not on file   Number of children: Not on file   Years of education: Not on file   Highest education level: Not on file  Occupational History   Not on file  Tobacco Use   Smoking status: Never   Smokeless tobacco: Never  Vaping Use   Vaping Use: Never used  Substance and Sexual Activity   Alcohol use: Not Currently   Drug use: No   Sexual activity: Yes  Other Topics Concern   Not on file  Social History Narrative   Patient recently lost youngest son he was killed in a motor cycle accident at age 39. June 2018   Social Determinants of Health   Emergency planning/management officer Strain: Not on file  Food Insecurity: Not on file  Transportation Needs: Not on file  Physical Activity: Not on file  Stress: Not on file  Social Connections: Not on file  Intimate Partner Violence: Not on file    Outpatient Medications Prior to Visit  Medication Sig Dispense Refill   blood glucose meter kit and supplies KIT Dispense based on patient and insurance preference. Use up to four times daily as directed. (FOR ICD-9 250.00, 250.01). 1 each 0   Blood Pressure Monitoring (BLOOD PRESSURE CUFF)  MISC Check blood pressure once daily at the same time and record readings 1 each 0   esomeprazole (NEXIUM) 20 MG capsule Take 1 capsule (20 mg total) by mouth as needed. 30 capsule 11   glucose blood (TRUE METRIX BLOOD GLUCOSE TEST) test strip TEST UP TO FOUR TIMES DAILY 100 each 0   insulin aspart (NOVOLOG) 100 UNIT/ML injection Inject 10 Units into the skin as needed for high blood sugar (blood sugar >240). 10 mL 3   Insulin Glargine (LANTUS SOLOSTAR) 100 UNIT/ML Solostar Pen Inject 20 Units into the skin daily at 10 pm. 15 mL 3   metFORMIN  (GLUCOPHAGE) 1000 MG tablet TAKE 1 TABLET (1,000 MG TOTAL) BY MOUTH 2 (TWO) TIMES DAILY WITH A MEAL. 180 tablet 3   candesartan (ATACAND) 32 MG tablet Take 1 tablet (32 mg total) by mouth daily. 30 tablet 11   omeprazole (PRILOSEC) 20 MG capsule Take 1 capsule (20 mg total) by mouth daily. (Patient not taking: Reported on 10/01/2021) 30 capsule 11   ondansetron (ZOFRAN ODT) 4 MG disintegrating tablet Take 1 tablet (4 mg total) by mouth every 8 (eight) hours as needed. (Patient not taking: Reported on 10/01/2021) 10 tablet 0   atorvastatin (LIPITOR) 40 MG tablet TAKE 1 TABLET (40 MG TOTAL) BY MOUTH DAILY. (Patient not taking: Reported on 10/01/2021) 30 tablet 1   chlorthalidone (HYGROTON) 25 MG tablet TAKE 1 TABLET BY MOUTH DAILY. 30 tablet 11   glipiZIDE (GLUCOTROL) 10 MG tablet TAKE 1 TABLET (10 MG TOTAL) BY MOUTH 2 (TWO) TIMES DAILY BEFORE A MEAL. 60 tablet 11   No facility-administered medications prior to visit.    Allergies  Allergen Reactions   Amlodipine Swelling   Carvedilol Diarrhea    Review of Systems  Constitutional:  Negative for chills, fever and malaise/fatigue.  HENT: Negative.    Eyes: Negative.   Respiratory:  Negative for cough and shortness of breath.   Cardiovascular:  Negative for chest pain, palpitations and leg swelling.  Gastrointestinal:  Negative for abdominal pain, blood in stool, constipation, diarrhea, nausea and vomiting.  Genitourinary: Negative.   Musculoskeletal: Negative.   Skin: Negative.   Neurological:        See HPI  Psychiatric/Behavioral:  Negative for depression. The patient is not nervous/anxious.   All other systems reviewed and are negative.     Objective:    Physical Exam Vitals reviewed.  Constitutional:      General: She is not in acute distress.    Appearance: Normal appearance.  HENT:     Head: Normocephalic.     Right Ear: Tympanic membrane, ear canal and external ear normal.     Left Ear: Tympanic membrane, ear canal and  external ear normal.     Nose: Nose normal.     Mouth/Throat:     Mouth: Mucous membranes are moist.     Pharynx: Oropharynx is clear.  Eyes:     Extraocular Movements: Extraocular movements intact.     Conjunctiva/sclera: Conjunctivae normal.     Pupils: Pupils are equal, round, and reactive to light.  Cardiovascular:     Rate and Rhythm: Normal rate and regular rhythm.     Pulses: Normal pulses.     Heart sounds: Normal heart sounds.     Comments: No obvious peripheral edema Pulmonary:     Effort: Pulmonary effort is normal.     Breath sounds: Normal breath sounds.  Abdominal:     General: Abdomen is flat. Bowel sounds are  normal.     Palpations: Abdomen is soft.  Musculoskeletal:        General: Normal range of motion.     Cervical back: Normal range of motion and neck supple.  Skin:    General: Skin is warm and dry.     Capillary Refill: Capillary refill takes less than 2 seconds.  Neurological:     General: No focal deficit present.     Mental Status: She is alert and oriented to person, place, and time.  Psychiatric:        Mood and Affect: Mood normal.        Behavior: Behavior normal.        Thought Content: Thought content normal.        Judgment: Judgment normal.    BP (!) 181/88   Pulse 72   Temp 97.7 F (36.5 C)   Ht '5\' 7"'  (1.702 m)   Wt 197 lb (89.4 kg)   SpO2 98%   BMI 30.85 kg/m  Wt Readings from Last 3 Encounters:  10/01/21 197 lb (89.4 kg)  01/02/21 199 lb 9.6 oz (90.5 kg)  08/07/20 205 lb 6.4 oz (93.2 kg)    Immunization History  Administered Date(s) Administered   PFIZER(Purple Top)SARS-COV-2 Vaccination 05/07/2020, 06/24/2020    Diabetic Foot Exam - Simple   No data filed     Lab Results  Component Value Date   TSH 3.880 08/29/2019   Lab Results  Component Value Date   WBC 5.5 10/01/2021   HGB 12.6 10/01/2021   HCT 38.4 10/01/2021   MCV 87 10/01/2021   PLT 276 10/01/2021   Lab Results  Component Value Date   NA 137  10/01/2021   K 4.0 10/01/2021   CO2 27 10/01/2021   GLUCOSE 302 (H) 10/01/2021   BUN 11 10/01/2021   CREATININE 1.07 (H) 10/01/2021   BILITOT 0.4 10/01/2021   ALKPHOS 141 (H) 10/01/2021   AST 22 10/01/2021   ALT 29 10/01/2021   PROT 7.7 10/01/2021   ALBUMIN 4.2 10/01/2021   CALCIUM 9.5 10/01/2021   ANIONGAP 11 12/15/2018   EGFR 61 10/01/2021   Lab Results  Component Value Date   CHOL 188 10/01/2021   CHOL 151 05/08/2020   CHOL 199 08/29/2019   Lab Results  Component Value Date   HDL 40 10/01/2021   HDL 46 05/08/2020   HDL 43 08/29/2019   Lab Results  Component Value Date   LDLCALC 110 (H) 10/01/2021   LDLCALC 78 05/08/2020   LDLCALC 128 (H) 08/29/2019   Lab Results  Component Value Date   TRIG 217 (H) 10/01/2021   TRIG 155 (H) 05/08/2020   TRIG 155 (H) 08/29/2019   Lab Results  Component Value Date   CHOLHDL 4.7 (H) 10/01/2021   CHOLHDL 3.3 05/08/2020   CHOLHDL 4.6 (H) 08/29/2019   Lab Results  Component Value Date   HGBA1C 9.3 (A) 10/01/2021   HGBA1C 9.3 10/01/2021   HGBA1C 9.3 (A) 10/01/2021   HGBA1C 9.3 (A) 10/01/2021       Assessment & Plan:   Problem List Items Addressed This Visit       Endocrine   T2DM (type 2 diabetes mellitus) ([place]) - Primary   Relevant Medications   glipiZIDE (GLUCOTROL) 10 MG tablet   metFORMIN (GLUCOPHAGE) 1000 MG tablet   atorvastatin (LIPITOR) 40 MG tablet   Other Relevant Orders   POCT glycosylated hemoglobin (Hb A1C) (Completed): 9.3, not at goal, likely due to non compliance  with medications.    Microalbumin/Creatinine Ratio, Urine   CBC with Differential/Platelet (Completed)   Comprehensive metabolic panel (Completed)   Lipid panel (Completed) Encouraged continued diet and exercise efforts  Encouraged continued compliance with medication     Other Visit Diagnoses     Essential hypertension       Relevant Medications   chlorthalidone (HYGROTON) 25 MG tablet   atorvastatin (LIPITOR) 40 MG tablet   Other  Relevant Orders   CBC with Differential/Platelet (Completed)   Comprehensive metabolic panel (Completed)   Lipid panel (Completed) Encouraged continued diet and exercise efforts  Encouraged continued compliance with medication     Neuropathy       Relevant Medications   gabapentin (NEURONTIN) 300 MG capsule Suspects symptoms related to peripheral neuropathy, given patients history. Will trial gabapentin.   Follow up in 1 mth for reevaluation of B/P, diabetes and neuropathy, sooner as needed. Will reevaluate need for additional B/P medications at follow up.     I have discontinued Tinika Bucknam. Dimitrov's chlorthalidone and glipiZIDE. I have also changed her glipiZIDE and chlorthalidone. Additionally, I am having her start on gabapentin. Lastly, I am having her maintain her Blood Pressure Cuff, glucose blood, ondansetron, blood glucose meter kit and supplies, insulin aspart, Lantus SoloStar, candesartan, esomeprazole, omeprazole, metFORMIN, and atorvastatin.  Meds ordered this encounter  Medications   DISCONTD: glipiZIDE (GLUCOTROL) 10 MG tablet    Sig: TAKE 1 TABLET (10 MG TOTAL) BY MOUTH 2 (TWO) TIMES DAILY BEFORE A MEAL.    Dispense:  60 tablet    Refill:  11   DISCONTD: chlorthalidone (HYGROTON) 25 MG tablet    Sig: TAKE 1 TABLET BY MOUTH DAILY.    Dispense:  30 tablet    Refill:  11   gabapentin (NEURONTIN) 300 MG capsule    Sig: Take 1 capsule (300 mg total) by mouth 3 (three) times daily.    Dispense:  90 capsule    Refill:  2   glipiZIDE (GLUCOTROL) 10 MG tablet    Sig: Take 1 tablet (10 mg total) by mouth 2 (two) times daily.    Dispense:  60 tablet    Refill:  2   chlorthalidone (HYGROTON) 25 MG tablet    Sig: Take 1 tablet (25 mg total) by mouth daily.    Dispense:  30 tablet    Refill:  2   metFORMIN (GLUCOPHAGE) 1000 MG tablet    Sig: Take 1 tablet (1,000 mg total) by mouth 2 (two) times daily with a meal.    Dispense:  60 tablet    Refill:  2   atorvastatin (LIPITOR)  40 MG tablet    Sig: Take 1 tablet (40 mg total) by mouth daily.    Dispense:  30 tablet    Refill:  2     Teena Dunk, NP

## 2021-10-02 ENCOUNTER — Other Ambulatory Visit: Payer: Self-pay

## 2021-10-02 LAB — CBC WITH DIFFERENTIAL/PLATELET
Basophils Absolute: 0.1 10*3/uL (ref 0.0–0.2)
Basos: 1 %
EOS (ABSOLUTE): 0.1 10*3/uL (ref 0.0–0.4)
Eos: 2 %
Hematocrit: 38.4 % (ref 34.0–46.6)
Hemoglobin: 12.6 g/dL (ref 11.1–15.9)
Immature Grans (Abs): 0 10*3/uL (ref 0.0–0.1)
Immature Granulocytes: 0 %
Lymphocytes Absolute: 2.1 10*3/uL (ref 0.7–3.1)
Lymphs: 38 %
MCH: 28.4 pg (ref 26.6–33.0)
MCHC: 32.8 g/dL (ref 31.5–35.7)
MCV: 87 fL (ref 79–97)
Monocytes Absolute: 0.3 10*3/uL (ref 0.1–0.9)
Monocytes: 6 %
Neutrophils Absolute: 2.9 10*3/uL (ref 1.4–7.0)
Neutrophils: 53 %
Platelets: 276 10*3/uL (ref 150–450)
RBC: 4.44 x10E6/uL (ref 3.77–5.28)
RDW: 12.1 % (ref 11.7–15.4)
WBC: 5.5 10*3/uL (ref 3.4–10.8)

## 2021-10-02 LAB — COMPREHENSIVE METABOLIC PANEL
ALT: 29 IU/L (ref 0–32)
AST: 22 IU/L (ref 0–40)
Albumin/Globulin Ratio: 1.2 (ref 1.2–2.2)
Albumin: 4.2 g/dL (ref 3.8–4.9)
Alkaline Phosphatase: 141 IU/L — ABNORMAL HIGH (ref 44–121)
BUN/Creatinine Ratio: 10 (ref 9–23)
BUN: 11 mg/dL (ref 6–24)
Bilirubin Total: 0.4 mg/dL (ref 0.0–1.2)
CO2: 27 mmol/L (ref 20–29)
Calcium: 9.5 mg/dL (ref 8.7–10.2)
Chloride: 99 mmol/L (ref 96–106)
Creatinine, Ser: 1.07 mg/dL — ABNORMAL HIGH (ref 0.57–1.00)
Globulin, Total: 3.5 g/dL (ref 1.5–4.5)
Glucose: 302 mg/dL — ABNORMAL HIGH (ref 70–99)
Potassium: 4 mmol/L (ref 3.5–5.2)
Sodium: 137 mmol/L (ref 134–144)
Total Protein: 7.7 g/dL (ref 6.0–8.5)
eGFR: 61 mL/min/{1.73_m2} (ref >59)

## 2021-10-02 LAB — LIPID PANEL
Chol/HDL Ratio: 4.7 ratio — ABNORMAL HIGH (ref 0.0–4.4)
Cholesterol, Total: 188 mg/dL (ref 100–199)
HDL: 40 mg/dL (ref >39)
LDL Chol Calc (NIH): 110 mg/dL — ABNORMAL HIGH (ref 0–99)
Triglycerides: 217 mg/dL — ABNORMAL HIGH (ref 0–149)
VLDL Cholesterol Cal: 38 mg/dL (ref 5–40)

## 2021-10-19 ENCOUNTER — Telehealth: Payer: Self-pay

## 2021-10-19 ENCOUNTER — Ambulatory Visit (INDEPENDENT_AMBULATORY_CARE_PROVIDER_SITE_OTHER): Payer: Self-pay | Admitting: Nurse Practitioner

## 2021-10-19 ENCOUNTER — Other Ambulatory Visit: Payer: Self-pay

## 2021-10-19 ENCOUNTER — Encounter: Payer: Self-pay | Admitting: Nurse Practitioner

## 2021-10-19 VITALS — BP 143/84 | HR 96 | Temp 98.1°F | Ht 67.0 in | Wt 186.0 lb

## 2021-10-19 DIAGNOSIS — E119 Type 2 diabetes mellitus without complications: Secondary | ICD-10-CM

## 2021-10-19 DIAGNOSIS — Z794 Long term (current) use of insulin: Secondary | ICD-10-CM

## 2021-10-19 DIAGNOSIS — K219 Gastro-esophageal reflux disease without esophagitis: Secondary | ICD-10-CM

## 2021-10-19 DIAGNOSIS — R1084 Generalized abdominal pain: Secondary | ICD-10-CM

## 2021-10-19 DIAGNOSIS — R110 Nausea: Secondary | ICD-10-CM

## 2021-10-19 LAB — GLUCOSE, POCT (MANUAL RESULT ENTRY): POC Glucose: 159 mg/dl — AB (ref 70–99)

## 2021-10-19 MED ORDER — LANTUS SOLOSTAR 100 UNIT/ML ~~LOC~~ SOPN
20.0000 [IU] | PEN_INJECTOR | Freq: Every day | SUBCUTANEOUS | 3 refills | Status: DC
  Filled 2021-10-19: qty 15, 75d supply, fill #0
  Filled 2021-10-19: qty 6, 30d supply, fill #0

## 2021-10-19 MED ORDER — ONDANSETRON 4 MG PO TBDP: 4.0000 mg | ORAL_TABLET | Freq: Three times a day (TID) | ORAL | 0 refills | Status: DC | PRN

## 2021-10-19 MED ORDER — OMEPRAZOLE 20 MG PO CPDR
20.0000 mg | DELAYED_RELEASE_CAPSULE | Freq: Every day | ORAL | 11 refills | Status: DC
  Filled 2021-10-19: qty 30, 30d supply, fill #0

## 2021-10-19 MED ORDER — DICYCLOMINE HCL 10 MG PO CAPS
10.0000 mg | ORAL_CAPSULE | Freq: Three times a day (TID) | ORAL | 0 refills | Status: DC | PRN
  Filled 2021-10-19: qty 30, 10d supply, fill #0

## 2021-10-19 MED ORDER — ONDANSETRON 4 MG PO TBDP
4.0000 mg | ORAL_TABLET | Freq: Three times a day (TID) | ORAL | 0 refills | Status: DC | PRN
  Filled 2021-10-19: qty 10, 4d supply, fill #0

## 2021-10-19 NOTE — Telephone Encounter (Signed)
Pt called and said that her insurance didn't cover the [place] and she wanted to know if you can send another brand?  Semglee  YSGM brand

## 2021-10-19 NOTE — Progress Notes (Signed)
[place], [place]  [address] Phone:  [phone]   Fax:  [phone] Subjective:   Patient ID: Kimberly Herman, female    DOB: 04-14-1966, 55 y.o.   MRN: [id number]  Chief Complaint  Patient presents with   Nausea    No appetite, burning sensation in stomach.  Patient stated she noticed this started to happen when she started back on her routine medications.   HPI Kimberly Herman 55 y.o. female  has a past medical history of Diabetes mellitus without complication ([place]), Hypertension, Menopause (2015), Vaginal polyp (10/2019), and Vitamin D deficiency (08/2019). To the [facility] for abdominal pain with nausea.  Patient states that she has had abdominal pain and nausea x 2-3 wks. Has been taking OTC medications with no improvement in symptoms. Denies any vomiting or diarrhea. Rates pain 7-8/10 and describes as cramping. Pain worsens with eating. Denies any improving factors. Symptoms began a week after restarting glipizide for diabetes.Has had decreased appetite due to symptoms. Verbalizes taking metformin and glipizide, but not taking insulin, states, "I thought I was only supposed to take if my blood sugar was greater than 240." FBG at home is typically between 150 and 180. Has been compliant with all medications. Denies any sick contacts. Abdominal pain located in the diffuse abdomen.  Patient requesting refill of Prilosec.Denies any fever. Denies any fatigue, chest pain, shortness of breath, HA or dizziness. Denies any blurred vision, numbness or tingling.   Past Medical History:  Diagnosis Date   Diabetes mellitus without complication ([place])    Hypertension    Menopause 2015   Vaginal polyp 10/2019   Vitamin D deficiency 08/2019    No past surgical history on file.  Family History  Problem Relation Age of Onset   Cancer Mother     Social History   Socioeconomic History   Marital status: Single    Spouse name: Not on file   Number  of children: Not on file   Years of education: Not on file   Highest education level: Not on file  Occupational History   Not on file  Tobacco Use   Smoking status: Never   Smokeless tobacco: Never  Vaping Use   Vaping Use: Never used  Substance and Sexual Activity   Alcohol use: Not Currently   Drug use: No   Sexual activity: Yes  Other Topics Concern   Not on file  Social History Narrative   Patient recently lost youngest son he was killed in a motor cycle accident at age 57. June 2018   Social Determinants of Radio broadcast assistant Strain: Not on file  Food Insecurity: Not on file  Transportation Needs: Not on file  Physical Activity: Not on file  Stress: Not on file  Social Connections: Not on file  Intimate Partner Violence: Not on file    Outpatient Medications Prior to Visit  Medication Sig Dispense Refill   atorvastatin (LIPITOR) 40 MG tablet Take 1 tablet (40 mg total) by mouth daily. 30 tablet 2   blood glucose meter kit and supplies KIT Dispense based on patient and insurance preference. Use up to four times daily as directed. (FOR ICD-9 250.00, 250.01). 1 each 0   Blood Pressure Monitoring (BLOOD PRESSURE CUFF) MISC Check blood pressure once daily at the same time and record readings 1 each 0   chlorthalidone (HYGROTON) 25 MG tablet Take 1 tablet (25 mg total) by mouth daily. 30 tablet  2   esomeprazole (NEXIUM) 20 MG capsule Take 1 capsule (20 mg total) by mouth as needed. 30 capsule 11   gabapentin (NEURONTIN) 300 MG capsule Take 1 capsule (300 mg total) by mouth 3 (three) times daily. 90 capsule 2   glucose blood (TRUE METRIX BLOOD GLUCOSE TEST) test strip TEST UP TO FOUR TIMES DAILY 100 each 0   insulin aspart (NOVOLOG) 100 UNIT/ML injection Inject 10 Units into the skin as needed for high blood sugar (blood sugar >240). 10 mL 3   metFORMIN (GLUCOPHAGE) 1000 MG tablet Take 1 tablet (1,000 mg total) by mouth 2 (two) times daily with a meal. 60 tablet 2    glipiZIDE (GLUCOTROL) 10 MG tablet Take 1 tablet (10 mg total) by mouth 2 (two) times daily. 60 tablet 2   Insulin Glargine (LANTUS SOLOSTAR) 100 UNIT/ML Solostar Pen Inject 20 Units into the skin daily at 10 pm. 15 mL 3   omeprazole (PRILOSEC) 20 MG capsule Take 1 capsule (20 mg total) by mouth daily. 30 capsule 11   ondansetron (ZOFRAN ODT) 4 MG disintegrating tablet Take 1 tablet (4 mg total) by mouth every 8 (eight) hours as needed. 10 tablet 0   candesartan (ATACAND) 32 MG tablet Take 1 tablet (32 mg total) by mouth daily. 30 tablet 11   No facility-administered medications prior to visit.    Allergies  Allergen Reactions   Amlodipine Swelling   Carvedilol Diarrhea    Review of Systems  Constitutional:  Negative for chills, fever and malaise/fatigue.  Respiratory:  Negative for cough and shortness of breath.   Cardiovascular:  Negative for chest pain, palpitations and leg swelling.  Gastrointestinal:  Positive for abdominal pain and nausea. Negative for blood in stool, constipation, diarrhea and vomiting.  Genitourinary: Negative.   Skin: Negative.   Neurological: Negative.   Psychiatric/Behavioral:  Negative for depression. The patient is not nervous/anxious.   All other systems reviewed and are negative.     Objective:    Physical Exam Vitals reviewed.  Constitutional:      General: She is not in acute distress.    Appearance: Normal appearance.  HENT:     Head: Normocephalic.  Cardiovascular:     Rate and Rhythm: Normal rate and regular rhythm.     Pulses: Normal pulses.     Heart sounds: Normal heart sounds.     Comments: No obvious peripheral edema Pulmonary:     Effort: Pulmonary effort is normal.     Breath sounds: Normal breath sounds.  Abdominal:     General: Abdomen is flat. Bowel sounds are normal. There is no distension.     Palpations: Abdomen is soft. There is no mass.     Tenderness: There is no abdominal tenderness. There is no right CVA tenderness,  left CVA tenderness or guarding.  Skin:    General: Skin is warm and dry.     Capillary Refill: Capillary refill takes less than 2 seconds.  Neurological:     General: No focal deficit present.     Mental Status: She is alert and oriented to person, place, and time.  Psychiatric:        Mood and Affect: Mood normal.        Behavior: Behavior normal.        Thought Content: Thought content normal.        Judgment: Judgment normal.    BP (!) 143/84 (BP Location: Right Arm, Patient Position: Sitting)   Pulse 96  Temp 98.1 F (36.7 C)   Ht '5\' 7"'  (1.702 m)   Wt 186 lb 0.2 oz (84.4 kg)   SpO2 98%   BMI 29.13 kg/m  Wt Readings from Last 3 Encounters:  10/19/21 186 lb 0.2 oz (84.4 kg)  10/01/21 197 lb (89.4 kg)  01/02/21 199 lb 9.6 oz (90.5 kg)    Immunization History  Administered Date(s) Administered   PFIZER(Purple Top)SARS-COV-2 Vaccination 05/07/2020, 06/24/2020    Diabetic Foot Exam - Simple   No data filed     Lab Results  Component Value Date   TSH 3.880 08/29/2019   Lab Results  Component Value Date   WBC 8.2 10/19/2021   HGB 12.2 10/19/2021   HCT 36.1 10/19/2021   MCV 87 10/19/2021   PLT 273 10/19/2021   Lab Results  Component Value Date   NA 139 10/19/2021   K 3.0 (L) 10/19/2021   CO2 29 10/19/2021   GLUCOSE 142 (H) 10/19/2021   BUN 17 10/19/2021   CREATININE 1.35 (H) 10/19/2021   BILITOT 1.7 (H) 10/19/2021   ALKPHOS 110 10/19/2021   AST 38 10/19/2021   ALT 42 (H) 10/19/2021   PROT 8.1 10/19/2021   ALBUMIN 4.2 10/19/2021   CALCIUM 9.4 10/19/2021   ANIONGAP 11 12/15/2018   EGFR 46 (L) 10/19/2021   Lab Results  Component Value Date   CHOL 188 10/01/2021   CHOL 151 05/08/2020   CHOL 199 08/29/2019   Lab Results  Component Value Date   HDL 40 10/01/2021   HDL 46 05/08/2020   HDL 43 08/29/2019   Lab Results  Component Value Date   LDLCALC 110 (H) 10/01/2021   LDLCALC 78 05/08/2020   LDLCALC 128 (H) 08/29/2019   Lab Results   Component Value Date   TRIG 217 (H) 10/01/2021   TRIG 155 (H) 05/08/2020   TRIG 155 (H) 08/29/2019   Lab Results  Component Value Date   CHOLHDL 4.7 (H) 10/01/2021   CHOLHDL 3.3 05/08/2020   CHOLHDL 4.6 (H) 08/29/2019   Lab Results  Component Value Date   HGBA1C 9.3 (A) 10/01/2021   HGBA1C 9.3 10/01/2021   HGBA1C 9.3 (A) 10/01/2021   HGBA1C 9.3 (A) 10/01/2021       Assessment & Plan:   Problem List Items Addressed This Visit       Endocrine   T2DM (type 2 diabetes mellitus) ([place])   Relevant Orders   Glucose (CBG) (Completed): 159, at goal Suspect symptom related to glipizide, will discontinue  Patient informed to begin taking previously prescribed Lantus nightly Patient to have close follow up in clinic  Informed to continue checking BG at home  Encouraged continued diet and exercise efforts  Encouraged continued compliance with medication     Other Visit Diagnoses     Generalized abdominal pain    -  Primary   Relevant Medications   dicyclomine (BENTYL) 10 MG capsule Informed that symptoms should improve with discontinuation of glipizide  Informed to take OTC medications as needed to assist with management of pain    Other Relevant Orders   CBC with Differential/Platelet (Completed)   Comprehensive metabolic panel (Completed)   Lipase (Completed)   Gastroesophageal reflux disease without esophagitis       Relevant Medications   omeprazole (PRILOSEC) 20 MG capsule   dicyclomine (BENTYL) 10 MG capsule   ondansetron (ZOFRAN-ODT) 4 MG disintegrating tablet   Nausea       Relevant Medications   ondansetron (ZOFRAN-ODT) 4 MG disintegrating  tablet Discussed non pharmacological methods that may assist with management of nausea   Follow up in 2 mths for revaluation of chronic illness, sooner as needed    I have discontinued Kimberly Herman's ondansetron, Lantus SoloStar, and glipiZIDE. I have also changed her ondansetron. Additionally, I am having her start on  dicyclomine. Lastly, I am having her maintain her Blood Pressure Cuff, glucose blood, blood glucose meter kit and supplies, insulin aspart, candesartan, esomeprazole, gabapentin, chlorthalidone, metFORMIN, atorvastatin, and omeprazole.  Meds ordered this encounter  Medications   omeprazole (PRILOSEC) 20 MG capsule    Sig: Take 1 capsule (20 mg total) by mouth daily.    Dispense:  30 capsule    Refill:  11   DISCONTD: ondansetron (ZOFRAN ODT) 4 MG disintegrating tablet    Sig: Take 1 tablet (4 mg total) by mouth every 8 (eight) hours as needed.    Dispense:  10 tablet    Refill:  0   dicyclomine (BENTYL) 10 MG capsule    Sig: Take 1 capsule (10 mg total) by mouth 3 (three) times daily as needed for spasms (abdominal pain).    Dispense:  30 capsule    Refill:  0   ondansetron (ZOFRAN-ODT) 4 MG disintegrating tablet    Sig: Take 1 tablet (4 mg total) by mouth every 8 (eight) hours as needed for nausea.    Dispense:  10 tablet    Refill:  0   DISCONTD: insulin glargine (LANTUS SOLOSTAR) 100 UNIT/ML Solostar Pen    Sig: Inject 20 Units into the skin daily at 10 pm.    Dispense:  15 mL    Refill:  3     Teena Dunk, NP

## 2021-10-19 NOTE — Patient Instructions (Signed)
You were seen today in the [facility] for abdominal pain and reevaluation of medications. Labs were collected, results will be available via MyChart or, if abnormal, you will be contacted by clinic staff. You were prescribed medications, please take as directed. Please follow up in 2 mths for reevaluation of chronic illness.

## 2021-10-20 ENCOUNTER — Other Ambulatory Visit: Payer: Self-pay

## 2021-10-20 ENCOUNTER — Other Ambulatory Visit: Payer: Self-pay | Admitting: Nurse Practitioner

## 2021-10-20 DIAGNOSIS — E119 Type 2 diabetes mellitus without complications: Secondary | ICD-10-CM

## 2021-10-20 LAB — COMPREHENSIVE METABOLIC PANEL
ALT: 42 IU/L — ABNORMAL HIGH (ref 0–32)
AST: 38 IU/L (ref 0–40)
Albumin/Globulin Ratio: 1.1 — ABNORMAL LOW (ref 1.2–2.2)
Albumin: 4.2 g/dL (ref 3.8–4.9)
Alkaline Phosphatase: 110 IU/L (ref 44–121)
BUN/Creatinine Ratio: 13 (ref 9–23)
BUN: 17 mg/dL (ref 6–24)
Bilirubin Total: 1.7 mg/dL — ABNORMAL HIGH (ref 0.0–1.2)
CO2: 29 mmol/L (ref 20–29)
Calcium: 9.4 mg/dL (ref 8.7–10.2)
Chloride: 94 mmol/L — ABNORMAL LOW (ref 96–106)
Creatinine, Ser: 1.35 mg/dL — ABNORMAL HIGH (ref 0.57–1.00)
Globulin, Total: 3.9 g/dL (ref 1.5–4.5)
Glucose: 142 mg/dL — ABNORMAL HIGH (ref 70–99)
Potassium: 3 mmol/L — ABNORMAL LOW (ref 3.5–5.2)
Sodium: 139 mmol/L (ref 134–144)
Total Protein: 8.1 g/dL (ref 6.0–8.5)
eGFR: 46 mL/min/{1.73_m2} — ABNORMAL LOW (ref >59)

## 2021-10-20 LAB — LIPASE: Lipase: 16 U/L (ref 14–72)

## 2021-10-20 LAB — CBC WITH DIFFERENTIAL/PLATELET
Basophils Absolute: 0 10*3/uL (ref 0.0–0.2)
Basos: 1 %
EOS (ABSOLUTE): 0.1 10*3/uL (ref 0.0–0.4)
Eos: 1 %
Hematocrit: 36.1 % (ref 34.0–46.6)
Hemoglobin: 12.2 g/dL (ref 11.1–15.9)
Immature Grans (Abs): 0 10*3/uL (ref 0.0–0.1)
Immature Granulocytes: 0 %
Lymphocytes Absolute: 1.3 10*3/uL (ref 0.7–3.1)
Lymphs: 16 %
MCH: 29.3 pg (ref 26.6–33.0)
MCHC: 33.8 g/dL (ref 31.5–35.7)
MCV: 87 fL (ref 79–97)
Monocytes Absolute: 0.5 10*3/uL (ref 0.1–0.9)
Monocytes: 7 %
Neutrophils Absolute: 6.2 10*3/uL (ref 1.4–7.0)
Neutrophils: 75 %
Platelets: 273 10*3/uL (ref 150–450)
RBC: 4.16 x10E6/uL (ref 3.77–5.28)
RDW: 12.3 % (ref 11.7–15.4)
WBC: 8.2 10*3/uL (ref 3.4–10.8)

## 2021-10-20 MED ORDER — INSULIN DETEMIR 100 UNIT/ML ~~LOC~~ SOLN
10.0000 [IU] | Freq: Every day | SUBCUTANEOUS | 2 refills | Status: DC
  Filled 2021-10-20: qty 3, 30d supply, fill #0

## 2021-10-20 MED ORDER — POTASSIUM CHLORIDE CRYS ER 20 MEQ PO TBCR
20.0000 meq | EXTENDED_RELEASE_TABLET | Freq: Two times a day (BID) | ORAL | 1 refills | Status: DC
  Filled 2021-10-20: qty 60, 30d supply, fill #0

## 2021-10-20 MED ORDER — INSULIN DETEMIR 100 UNIT/ML ~~LOC~~ SOLN
10.0000 [IU] | Freq: Every day | SUBCUTANEOUS | 2 refills | Status: DC
  Filled 2021-10-20 – 2022-08-03 (×2): qty 10, 42d supply, fill #0

## 2021-10-20 NOTE — Telephone Encounter (Signed)
Patient notified of new rx

## 2021-10-21 ENCOUNTER — Other Ambulatory Visit: Payer: Self-pay

## 2021-10-22 ENCOUNTER — Other Ambulatory Visit: Payer: Self-pay

## 2021-12-21 ENCOUNTER — Other Ambulatory Visit: Payer: Self-pay

## 2021-12-21 ENCOUNTER — Ambulatory Visit: Payer: Self-pay | Admitting: Nurse Practitioner

## 2021-12-21 ENCOUNTER — Encounter: Payer: Self-pay | Admitting: Nurse Practitioner

## 2021-12-21 ENCOUNTER — Ambulatory Visit (INDEPENDENT_AMBULATORY_CARE_PROVIDER_SITE_OTHER): Payer: Self-pay | Admitting: Nurse Practitioner

## 2021-12-21 VITALS — BP 153/81 | HR 82 | Temp 98.0°F | Ht 67.0 in | Wt 186.2 lb

## 2021-12-21 DIAGNOSIS — I10 Essential (primary) hypertension: Secondary | ICD-10-CM

## 2021-12-21 DIAGNOSIS — E119 Type 2 diabetes mellitus without complications: Secondary | ICD-10-CM

## 2021-12-21 LAB — POCT GLYCOSYLATED HEMOGLOBIN (HGB A1C)
HbA1c POC (<> result, manual entry): 6.7 % (ref 4.0–5.6)
HbA1c, POC (controlled diabetic range): 6.7 % (ref 0.0–7.0)
HbA1c, POC (prediabetic range): 6.7 % — AB (ref 5.7–6.4)
Hemoglobin A1C: 6.7 % — AB (ref 4.0–5.6)

## 2021-12-21 MED ORDER — CHLORTHALIDONE 25 MG PO TABS
25.0000 mg | ORAL_TABLET | Freq: Every day | ORAL | 5 refills | Status: DC
  Filled 2021-12-21: qty 30, 30d supply, fill #0

## 2021-12-21 MED ORDER — CANDESARTAN CILEXETIL 32 MG PO TABS
32.0000 mg | ORAL_TABLET | Freq: Every day | ORAL | 11 refills | Status: DC
  Filled 2021-12-21 – 2022-08-03 (×2): qty 30, 30d supply, fill #0

## 2021-12-21 NOTE — Patient Instructions (Signed)
You were seen today in the [facility] for reevaluation of diabetes and hypertension. Labs were collected, results will be available via MyChart or, if abnormal, you will be contacted by clinic staff. You were prescribed medications, please take as directed. Please follow up in 6 mths for reevaluation.

## 2021-12-21 NOTE — Progress Notes (Signed)
[address], [place]  [address] Phone:  [phone]   Fax:  [phone] Subjective:   Patient ID: Kimberly Herman, female    DOB: 12-Jan-1966, 56 y.o.   MRN: [id number]  Chief Complaint  Patient presents with   Follow-up    Pt is here for follow up visit. Pt has no question or concerns    MAGUIRE KILLMER 56 y.o. female  has a past medical history of Diabetes mellitus without complication ([place]), Hypertension, Menopause (2015), Vaginal polyp (10/2019), and Vitamin D deficiency (08/2019).  To the [facility] for reevaluation of B/P and diabetes.  Hypertension: Patient here for follow-up of elevated blood pressure. She is not exercising and is adherent to low salt diet.  Patient denies checking B/P at home. Cardiac symptoms none. Patient denies chest pain, chest pressure/discomfort, exertional chest pressure/discomfort, fatigue, irregular heart beat, palpitations, and paroxysmal nocturnal dyspnea.  Cardiovascular risk factors: advanced age (older than 59 for men, 60 for women), diabetes mellitus, and hypertension. Use of agents associated with hypertension: none. History of target organ damage: none. Patient states that she monitors diet, but doe snot have an appetite most of the day. Typically eats 1-2 x/ day, with decreased amounts of carbohydrates.   Diabetes She presents for her follow-up diabetic visit. She has type 2 diabetes mellitus. Her disease course has been improving. There are no hypoglycemic associated symptoms. There are no diabetic associated symptoms. There are no hypoglycemic complications. There are no diabetic complications. Risk factors for coronary artery disease include diabetes mellitus, hypertension, obesity and sedentary lifestyle. Current diabetic treatment includes insulin injections, diet and oral agent (monotherapy). She is compliant with treatment all of the time. She is following a generally healthy diet. Meal planning includes  carbohydrate counting and avoidance of concentrated sweets. She has not had a previous visit with a dietitian. She rarely participates in exercise. Her home blood glucose trend is decreasing steadily. An ACE inhibitor/angiotensin II receptor blocker is being taken.     Past Medical History:  Diagnosis Date   Diabetes mellitus without complication ([place])    Hypertension    Menopause 2015   Vaginal polyp 10/2019   Vitamin D deficiency 08/2019    No past surgical history on file.  Family History  Problem Relation Age of Onset   Cancer Mother     Social History   Socioeconomic History   Marital status: Single    Spouse name: Not on file   Number of children: Not on file   Years of education: Not on file   Highest education level: Not on file  Occupational History   Not on file  Tobacco Use   Smoking status: Never   Smokeless tobacco: Never  Vaping Use   Vaping Use: Never used  Substance and Sexual Activity   Alcohol use: Not Currently   Drug use: No   Sexual activity: Yes  Other Topics Concern   Not on file  Social History Narrative   Patient recently lost youngest son he was killed in a motor cycle accident at age 82. June 2018   Social Determinants of Health   Emergency planning/management officer Strain: Not on file  Food Insecurity: Not on file  Transportation Needs: Not on file  Physical Activity: Not on file  Stress: Not on file  Social Connections: Not on file  Intimate Partner Violence: Not on file    Outpatient Medications Prior to Visit  Medication Sig Dispense Refill  atorvastatin (LIPITOR) 40 MG tablet Take 1 tablet (40 mg total) by mouth daily. 30 tablet 2   blood glucose meter kit and supplies KIT Dispense based on patient and insurance preference. Use up to four times daily as directed. (FOR ICD-9 250.00, 250.01). 1 each 0   Blood Pressure Monitoring (BLOOD PRESSURE CUFF) MISC Check blood pressure once daily at the same time and record readings 1 each 0    dicyclomine (BENTYL) 10 MG capsule Take 1 capsule (10 mg total) by mouth 3 (three) times daily as needed for spasms (abdominal pain). 30 capsule 0   esomeprazole (NEXIUM) 20 MG capsule Take 1 capsule (20 mg total) by mouth as needed. 30 capsule 11   gabapentin (NEURONTIN) 300 MG capsule Take 1 capsule (300 mg total) by mouth 3 (three) times daily. 90 capsule 2   glucose blood (TRUE METRIX BLOOD GLUCOSE TEST) test strip TEST UP TO FOUR TIMES DAILY 100 each 0   insulin detemir (LEVEMIR) 100 UNIT/ML injection Inject 0.1 mLs (10 Units total) into the skin at bedtime. 10 mL 2   metFORMIN (GLUCOPHAGE) 1000 MG tablet Take 1 tablet (1,000 mg total) by mouth 2 (two) times daily with a meal. 60 tablet 2   omeprazole (PRILOSEC) 20 MG capsule Take 1 capsule (20 mg total) by mouth daily. 30 capsule 11   ondansetron (ZOFRAN-ODT) 4 MG disintegrating tablet Take 1 tablet (4 mg total) by mouth every 8 (eight) hours as needed for nausea. 10 tablet 0   chlorthalidone (HYGROTON) 25 MG tablet Take 1 tablet (25 mg total) by mouth daily. 30 tablet 2   insulin aspart (NOVOLOG) 100 UNIT/ML injection Inject 10 Units into the skin as needed for high blood sugar (blood sugar >240). 10 mL 3   potassium chloride SA (KLOR-CON M) 20 MEQ tablet Take 1 tablet (20 mEq total) by mouth 2 (two) times daily. 60 tablet 1   candesartan (ATACAND) 32 MG tablet Take 1 tablet (32 mg total) by mouth daily. 30 tablet 11   No facility-administered medications prior to visit.    Allergies  Allergen Reactions   Amlodipine Swelling   Carvedilol Diarrhea    Review of Systems  Constitutional:  Negative for chills, fever and malaise/fatigue.  Respiratory:  Negative for cough and shortness of breath.   Cardiovascular:  Negative for chest pain, palpitations and leg swelling.  Gastrointestinal:  Negative for abdominal pain, blood in stool, constipation, diarrhea, nausea and vomiting.  Musculoskeletal: Negative.   Skin: Negative.    Neurological: Negative.   Psychiatric/Behavioral:  Negative for depression. The patient is not nervous/anxious.   All other systems reviewed and are negative.     Objective:    Physical Exam Vitals reviewed.  Constitutional:      General: She is not in acute distress.    Appearance: Normal appearance. She is obese.  HENT:     Head: Normocephalic.     Nose: Nose normal.     Mouth/Throat:     Mouth: Mucous membranes are moist.     Pharynx: Oropharynx is clear.  Neck:     Vascular: No carotid bruit.  Cardiovascular:     Rate and Rhythm: Normal rate and regular rhythm.     Pulses: Normal pulses.     Heart sounds: Normal heart sounds.     Comments: No obvious peripheral edema Pulmonary:     Effort: Pulmonary effort is normal.     Breath sounds: Normal breath sounds.  Musculoskeletal:  General: No swelling, tenderness, deformity or signs of injury. Normal range of motion.     Cervical back: Normal range of motion and neck supple. No rigidity or tenderness.     Right lower leg: No edema.     Left lower leg: No edema.  Lymphadenopathy:     Cervical: No cervical adenopathy.  Skin:    General: Skin is warm and dry.     Capillary Refill: Capillary refill takes less than 2 seconds.  Neurological:     General: No focal deficit present.     Mental Status: She is alert and oriented to person, place, and time.  Psychiatric:        Mood and Affect: Mood normal.        Behavior: Behavior normal.        Thought Content: Thought content normal.        Judgment: Judgment normal.    BP (!) 153/81    Pulse 82    Temp 98 F (36.7 C)    Ht '5\' 7"'  (1.702 m)    Wt 186 lb 4 oz (84.5 kg)    SpO2 98%    BMI 29.17 kg/m  Wt Readings from Last 3 Encounters:  12/21/21 186 lb 4 oz (84.5 kg)  10/19/21 186 lb 0.2 oz (84.4 kg)  10/01/21 197 lb (89.4 kg)    Immunization History  Administered Date(s) Administered   PFIZER(Purple Top)SARS-COV-2 Vaccination 05/07/2020, 06/24/2020     Diabetic Foot Exam - Simple   No data filed     Lab Results  Component Value Date   TSH 3.880 08/29/2019   Lab Results  Component Value Date   WBC 8.2 10/19/2021   HGB 12.2 10/19/2021   HCT 36.1 10/19/2021   MCV 87 10/19/2021   PLT 273 10/19/2021   Lab Results  Component Value Date   NA 139 10/19/2021   K 3.0 (L) 10/19/2021   CO2 29 10/19/2021   GLUCOSE 142 (H) 10/19/2021   BUN 17 10/19/2021   CREATININE 1.35 (H) 10/19/2021   BILITOT 1.7 (H) 10/19/2021   ALKPHOS 110 10/19/2021   AST 38 10/19/2021   ALT 42 (H) 10/19/2021   PROT 8.1 10/19/2021   ALBUMIN 4.2 10/19/2021   CALCIUM 9.4 10/19/2021   ANIONGAP 11 12/15/2018   EGFR 46 (L) 10/19/2021   Lab Results  Component Value Date   CHOL 188 10/01/2021   CHOL 151 05/08/2020   CHOL 199 08/29/2019   Lab Results  Component Value Date   HDL 40 10/01/2021   HDL 46 05/08/2020   HDL 43 08/29/2019   Lab Results  Component Value Date   LDLCALC 110 (H) 10/01/2021   LDLCALC 78 05/08/2020   LDLCALC 128 (H) 08/29/2019   Lab Results  Component Value Date   TRIG 217 (H) 10/01/2021   TRIG 155 (H) 05/08/2020   TRIG 155 (H) 08/29/2019   Lab Results  Component Value Date   CHOLHDL 4.7 (H) 10/01/2021   CHOLHDL 3.3 05/08/2020   CHOLHDL 4.6 (H) 08/29/2019   Lab Results  Component Value Date   HGBA1C 6.7 (A) 12/21/2021   HGBA1C 6.7 12/21/2021   HGBA1C 6.7 (A) 12/21/2021   HGBA1C 6.7 12/21/2021       Assessment & Plan:   Problem List Items Addressed This Visit       Endocrine   T2DM (type 2 diabetes mellitus) ([place]) - Primary   Relevant Medications   candesartan (ATACAND) 32 MG tablet   Other Relevant  Orders   POCT glycosylated hemoglobin (Hb A1C) (Completed), 6.7, at goal and improved since previous visit Encouraged continued diet and exercise efforts  Encouraged continued compliance with medication     Other Visit Diagnoses     Essential hypertension       Relevant Medications   candesartan  (ATACAND) 32 MG tablet, medication added to patient treatment regimen    chlorthalidone (HYGROTON) 25 MG tablet, medication refilled  Encouraged continued diet and exercise efforts  Encouraged continued compliance with medication     Follow up in 6 mths for reevaluation of B/P and diabetes, sooner as needed    I am having Amarie Viles. Woerner maintain her Blood Pressure Cuff, glucose blood, blood glucose meter kit and supplies, insulin aspart, esomeprazole, gabapentin, metFORMIN, atorvastatin, omeprazole, dicyclomine, ondansetron, insulin detemir, potassium chloride SA, candesartan, and chlorthalidone.  Meds ordered this encounter  Medications   candesartan (ATACAND) 32 MG tablet    Sig: Take 1 tablet (32 mg total) by mouth daily.    Dispense:  30 tablet    Refill:  11   chlorthalidone (HYGROTON) 25 MG tablet    Sig: Take 1 tablet (25 mg total) by mouth daily.    Dispense:  30 tablet    Refill:  5     Teena Dunk, NP

## 2021-12-22 ENCOUNTER — Other Ambulatory Visit: Payer: Self-pay

## 2021-12-29 ENCOUNTER — Other Ambulatory Visit: Payer: Self-pay

## 2022-01-11 ENCOUNTER — Other Ambulatory Visit (HOSPITAL_BASED_OUTPATIENT_CLINIC_OR_DEPARTMENT_OTHER): Payer: Self-pay

## 2022-01-25 ENCOUNTER — Emergency Department (HOSPITAL_BASED_OUTPATIENT_CLINIC_OR_DEPARTMENT_OTHER)
Admission: EM | Admit: 2022-01-25 | Discharge: 2022-01-25 | Disposition: A | Discharge status: Home / Self Care | Payer: BLUE CROSS/BLUE SHIELD | Attending: Emergency Medicine | Admitting: Emergency Medicine

## 2022-01-25 ENCOUNTER — Other Ambulatory Visit: Payer: Self-pay

## 2022-01-25 ENCOUNTER — Emergency Department (HOSPITAL_BASED_OUTPATIENT_CLINIC_OR_DEPARTMENT_OTHER): Payer: BLUE CROSS/BLUE SHIELD

## 2022-01-25 ENCOUNTER — Encounter (HOSPITAL_BASED_OUTPATIENT_CLINIC_OR_DEPARTMENT_OTHER): Payer: Self-pay | Admitting: *Deleted

## 2022-01-25 DIAGNOSIS — Z7984 Long term (current) use of oral hypoglycemic drugs: Secondary | ICD-10-CM | POA: Diagnosis not present

## 2022-01-25 DIAGNOSIS — M62838 Other muscle spasm: Secondary | ICD-10-CM

## 2022-01-25 DIAGNOSIS — M62830 Muscle spasm of back: Secondary | ICD-10-CM | POA: Diagnosis not present

## 2022-01-25 DIAGNOSIS — M546 Pain in thoracic spine: Secondary | ICD-10-CM | POA: Diagnosis present

## 2022-01-25 DIAGNOSIS — Z794 Long term (current) use of insulin: Secondary | ICD-10-CM | POA: Diagnosis not present

## 2022-01-25 IMAGING — DX DG RIBS W/ CHEST 3+V*L*
3 series · 3 of 3 positions shown · non-contrast
Comparison: None.

CLINICAL DATA: Left rib pain

EXAM:
LEFT RIBS AND CHEST - 3+ VIEW

[chest pa]
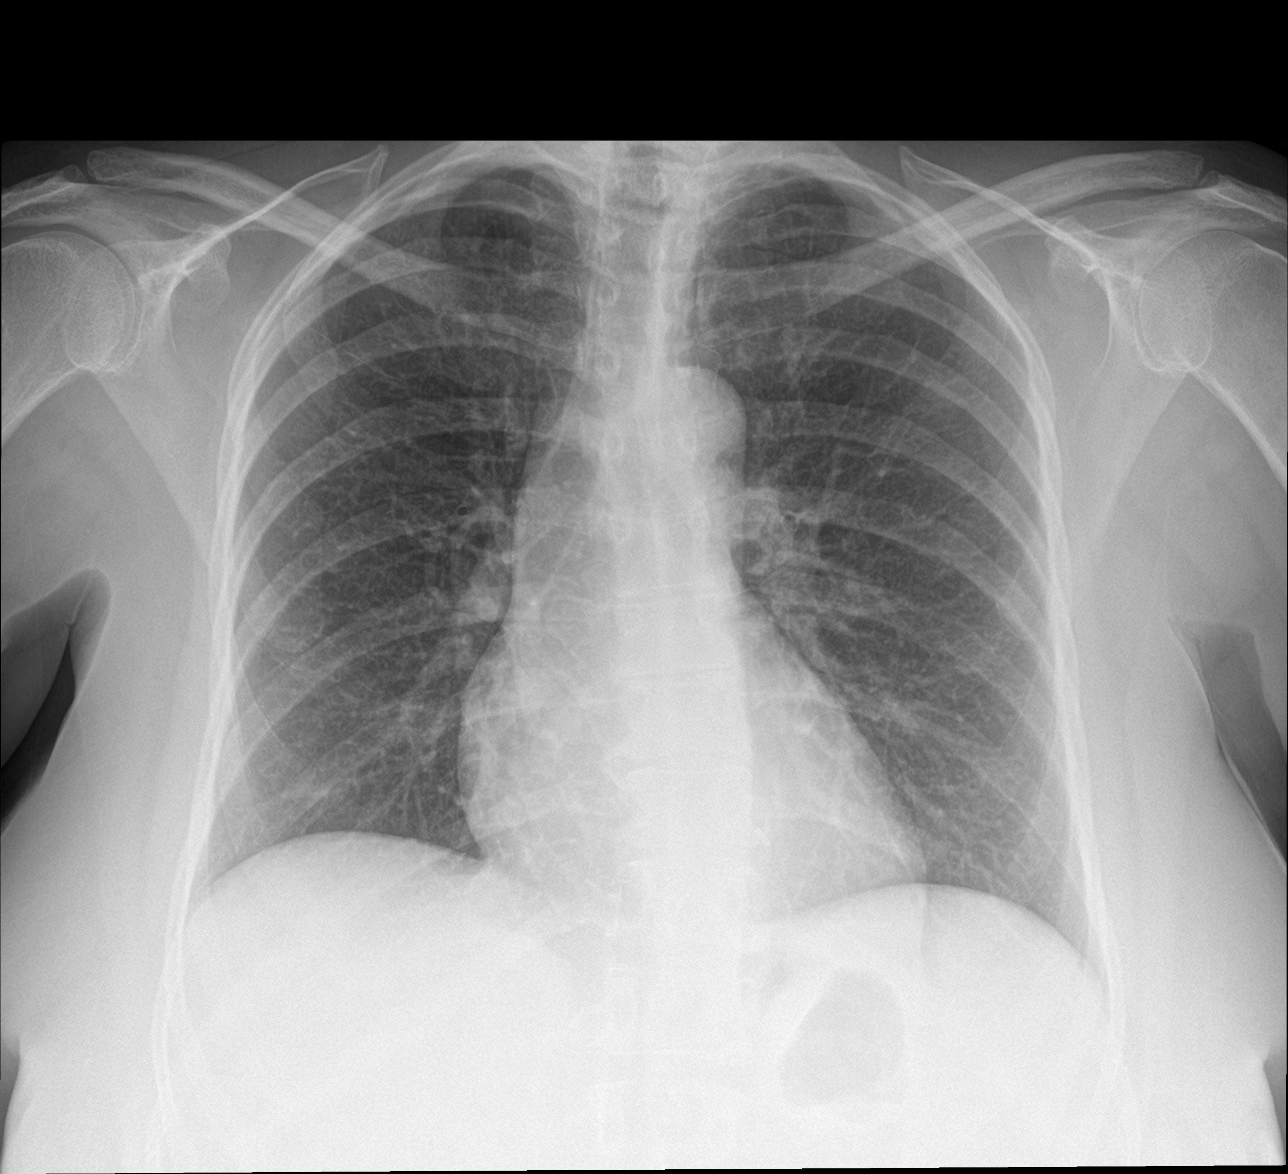

[rib ap]
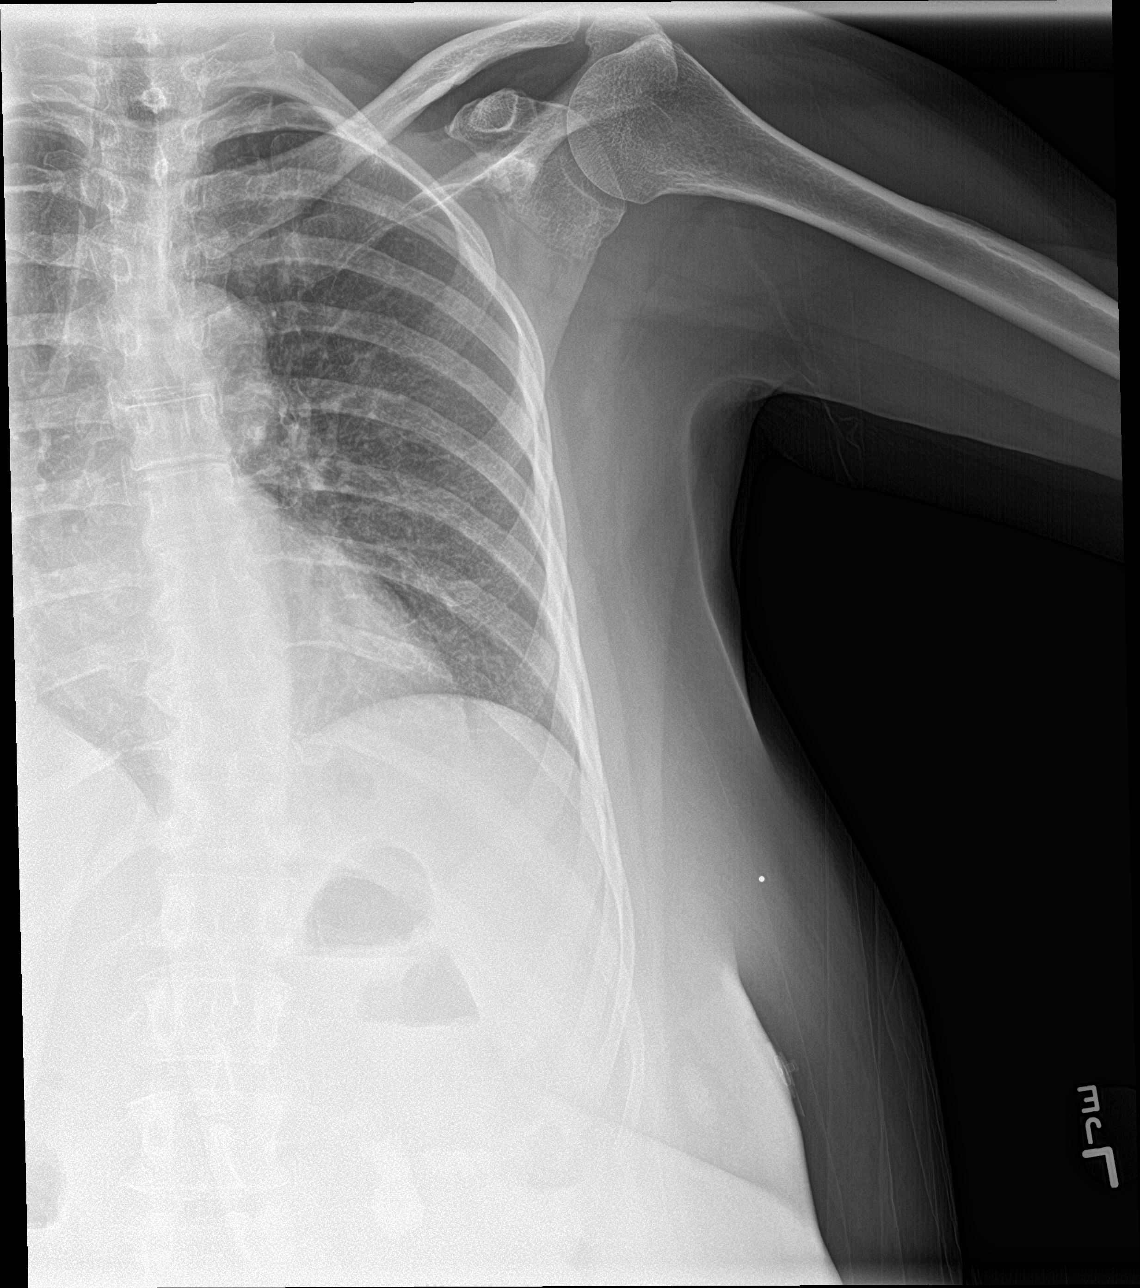

[rib ap obl]
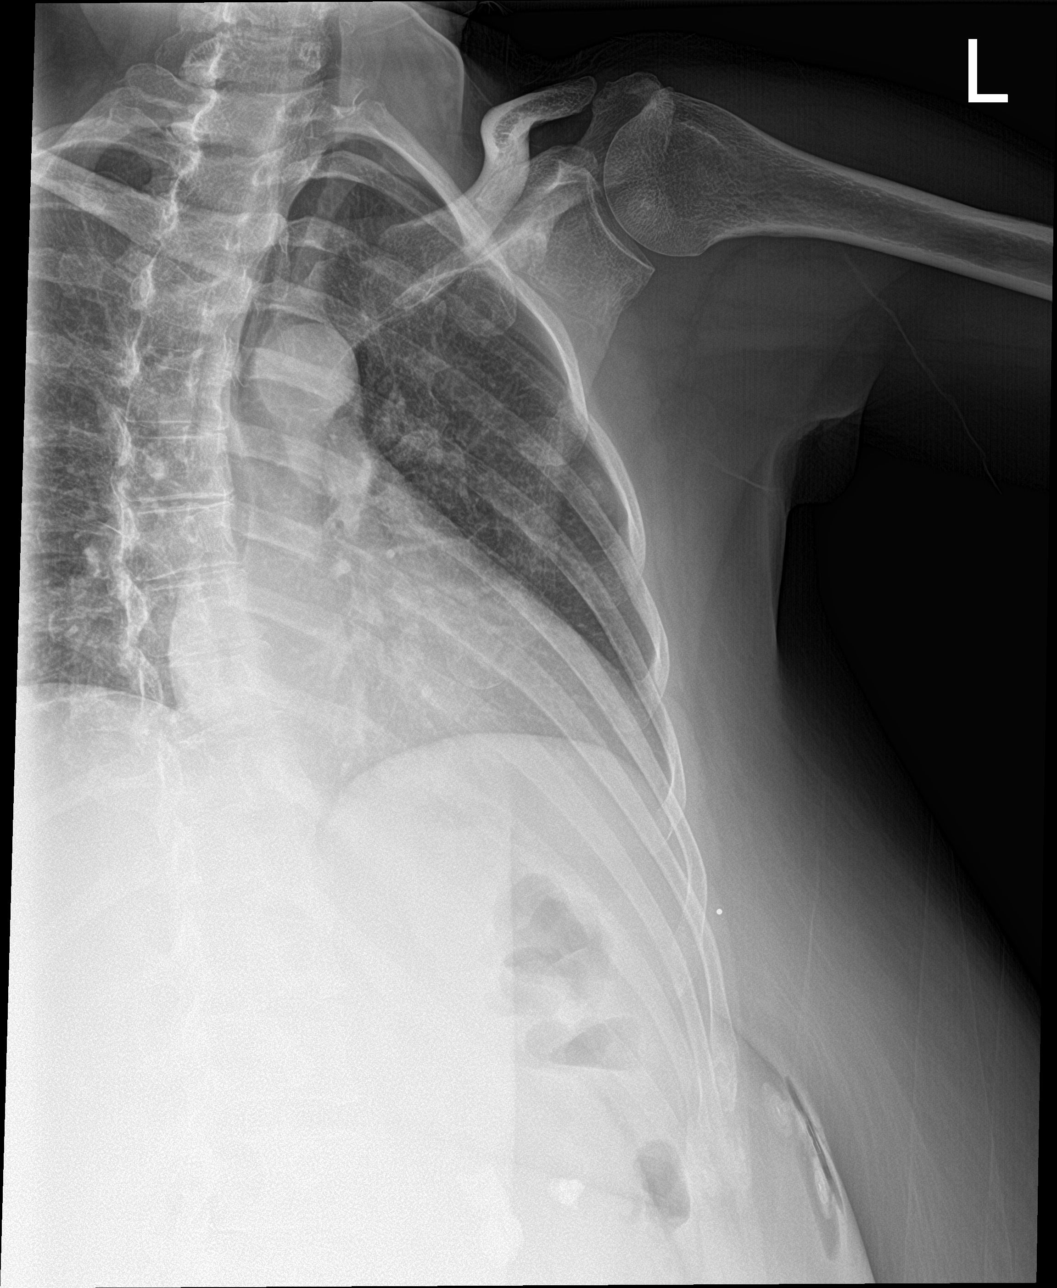

[3 of 3 positions shown; findings below may reference images not displayed]

FINDINGS: No fracture or other bone lesions are seen involving the ribs. There
is no evidence of pneumothorax or pleural effusion. Both lungs are
clear. Heart size and mediastinal contours are within normal limits.
IMPRESSION: No acute fracture or acute intrathoracic process.

## 2022-01-25 MED ORDER — CYCLOBENZAPRINE HCL 5 MG PO TABS
5.0000 mg | ORAL_TABLET | Freq: Once | ORAL | Status: AC
  Administered 2022-01-25: 5 mg via ORAL
  Filled 2022-01-25: qty 1

## 2022-01-25 MED ORDER — CYCLOBENZAPRINE HCL 7.5 MG PO TABS: 7.5000 mg | ORAL_TABLET | Freq: Two times a day (BID) | ORAL | 0 refills | Status: DC | PRN

## 2022-01-25 MED ORDER — LIDOCAINE 5 % EX PTCH: 1.0000 | MEDICATED_PATCH | CUTANEOUS | 0 refills | Status: DC

## 2022-01-25 MED ORDER — LIDOCAINE 5 % EX PTCH
1.0000 | MEDICATED_PATCH | CUTANEOUS | Status: DC
  Administered 2022-01-25: 1 via TRANSDERMAL
  Filled 2022-01-25: qty 1

## 2022-01-25 NOTE — Discharge Instructions (Addendum)
You have been seen and discharged from the emergency department.  I believe that you are suffering from muscle spasm.  Use pain patches as directed.  You may take Tylenol/ibuprofen for pain control.  Use ice/heat on the area for relief.  Take muscle relaxer as needed.  Do not mix this medication with alcohol or other sedating medications. Do not drive or do heavy physical activity until you know how this medication affects you.  It may cause drowsiness.  Follow-up with your primary provider for further evaluation and further care. Take home medications as prescribed. If you have any worsening symptoms or further concerns for your health please return to an emergency department for further evaluation. ?

## 2022-01-25 NOTE — ED Notes (Signed)
Pt to xray

## 2022-01-25 NOTE — ED Notes (Signed)
Pt returned from xray

## 2022-01-25 NOTE — ED Provider Notes (Signed)
?[facility] EMERGENC[facility] DEPARTMENT ?Provider Note ? ? ?CSN: [id number] ?Arrival date & time: 01/25/22  1810 ? ?  ? ?History ? ?Chief Complaint  ?Patient presents with  ? Scapula Pain  ? ? ?Kimberly Herman is a 56 y.o. female. ? ?HPI ? ?56 year old female presents emergency department with left upper back pain.  Patient states that this has been going on for the past 2 months, intermittent, sometimes movement specific, worse at night.  Patient has no associated chest pain, shortness of breath, palpitations, cough.  Patient's daughter has a newborn baby that she has been helping with, she has also had a recent intentional weight loss but continues to be large chested.  She feels as if she is having left upper back strain and discomfort.  Denies any trauma to the area, no rash noted.  Otherwise has been in her usual state of health. ? ?Home Medications ?Prior to Admission medications   ?Medication Sig Start Date End Date Taking? Authorizing Provider  ?cyclobenzaprine (FEXMID) 7.5 MG tablet Take 1 tablet (7.5 mg total) by mouth 2 (two) times daily as needed for muscle spasms. 01/25/22  Yes Lillith Mcneff, Alvin Critchley, DO  ?lidocaine (LIDODERM) 5 % Place 1 patch onto the skin daily. Remove & Discard patch within 12 hours or as directed by MD 01/25/22  Yes Querida Beretta, Alvin Critchley, DO  ?atorvastatin (LIPITOR) 40 MG tablet Take 1 tablet (40 mg total) by mouth daily. 10/01/21 12/30/21  Bo Merino I, NP  ?blood glucose meter kit and supplies KIT Dispense based on patient and insurance preference. Use up to four times daily as directed. (FOR ICD-9 250.00, 250.01). 08/29/19   Azzie Glatter, FNP  ?Blood Pressure Monitoring (BLOOD PRESSURE CUFF) MISC Check blood pressure once daily at the same time and record readings 03/07/17   Scot Jun, FNP  ?candesartan (ATACAND) 32 MG tablet Take 1 tablet (32 mg total) by mouth daily. 12/21/21 12/21/22  Bo Merino I, NP  ?chlorthalidone (HYGROTON) 25 MG tablet Take 1 tablet (25 mg total)  by mouth daily. 12/21/21 06/19/22  Bo Merino I, NP  ?dicyclomine (BENTYL) 10 MG capsule Take 1 capsule (10 mg total) by mouth 3 (three) times daily as needed for spasms (abdominal pain). 10/19/21   Bo Merino I, NP  ?esomeprazole (NEXIUM) 20 MG capsule Take 1 capsule (20 mg total) by mouth as needed. 06/25/20   Azzie Glatter, FNP  ?gabapentin (NEURONTIN) 300 MG capsule Take 1 capsule (300 mg total) by mouth 3 (three) times daily. 10/01/21   Bo Merino I, NP  ?glucose blood (TRUE METRIX BLOOD GLUCOSE TEST) test strip TEST UP TO FOUR TIMES DAILY 05/11/17   Scot Jun, FNP  ?insulin aspart (NOVOLOG) 100 UNIT/ML injection Inject 10 Units into the skin as needed for high blood sugar (blood sugar >240). 08/29/19   Azzie Glatter, FNP  ?insulin detemir (LEVEMIR) 100 UNIT/ML injection Inject 0.1 mLs (10 Units total) into the skin at bedtime. 10/20/21 01/18/22  Bo Merino I, NP  ?metFORMIN (GLUCOPHAGE) 1000 MG tablet Take 1 tablet (1,000 mg total) by mouth 2 (two) times daily with a meal. 10/01/21 12/30/21  Passmore, Jake Church I, NP  ?omeprazole (PRILOSEC) 20 MG capsule Take 1 capsule (20 mg total) by mouth daily. 10/19/21   Bo Merino I, NP  ?ondansetron (ZOFRAN-ODT) 4 MG disintegrating tablet Take 1 tablet (4 mg total) by mouth every 8 (eight) hours as needed for nausea. 10/19/21   Bo Merino I, NP  ?potassium chloride SA (  KLOR-CON M) 20 MEQ tablet Take 1 tablet (20 mEq total) by mouth 2 (two) times daily. 10/20/21 12/19/21  Bo Merino I, NP  ?   ? ?Allergies    ?Amlodipine and Carvedilol   ? ?Review of Systems   ?Review of Systems  ?Constitutional:  Negative for fever.  ?Respiratory:  Negative for shortness of breath.   ?Cardiovascular:  Negative for chest pain and palpitations.  ?Gastrointestinal:  Negative for abdominal pain, diarrhea and vomiting.  ?Musculoskeletal:  Positive for back pain. Negative for neck pain.  ?Skin:  Negative for rash.  ?Neurological:  Negative for  headaches.  ? ?Physical Exam ?Updated Vital Signs ?BP 130/70   Pulse 73   Temp 98.9 ?F (37.2 ?C) (Oral)   Resp 18   Ht '5\' 7"'  (1.702 m)   Wt 84.5 kg   SpO2 99%   BMI 29.18 kg/m?  ?Physical Exam ?Vitals and nursing note reviewed.  ?Constitutional:   ?   General: She is not in acute distress. ?   Appearance: Normal appearance. She is not diaphoretic.  ?HENT:  ?   Head: Normocephalic.  ?   Mouth/Throat:  ?   Mouth: Mucous membranes are moist.  ?Cardiovascular:  ?   Rate and Rhythm: Normal rate.  ?Pulmonary:  ?   Effort: Pulmonary effort is normal. No respiratory distress.  ?Abdominal:  ?   Palpations: Abdomen is soft.  ?Musculoskeletal:  ?   Comments: Small area of swelling and spasm in the left thoracic paraspinal musculature that is very tender to palpation, no overlying rash, no overlying skin changes, exam of the left upper extremity is normal, lung sounds are equal  ?Skin: ?   General: Skin is warm.  ?Neurological:  ?   Mental Status: She is alert and oriented to person, place, and time. Mental status is at baseline.  ?Psychiatric:     ?   Mood and Affect: Mood normal.  ? ? ?ED Results / Procedures / Treatments   ?Labs ?(all labs ordered are listed, but only abnormal results are displayed) ?Labs Reviewed - No data to display ? ?EKG ?EKG Interpretation ? ?Date/Time:  Monday January 25 2022 18:27:02 EST ?Ventricular Rate:  98 ?PR Interval:  130 ?QRS Duration: 86 ?QT Interval:  334 ?QTC Calculation: 426 ?R Axis:   49 ?Text Interpretation: Normal sinus rhythm Nonspecific T wave abnormality Abnormal ECG When compared with ECG of 19-Nov-2002 17:28, PREVIOUS ECG IS PRESENT Confirmed by Lavenia Atlas [phone]) on 01/25/2022 7:56:50 PM ? ?Radiology ?DG Ribs Unilateral W/Chest Left ? ?Result Date: 01/25/2022 ?CLINICAL DATA:  Left rib pain EXAM: LEFT RIBS AND CHEST - 3+ VIEW COMPARISON:  None. FINDINGS: No fracture or other bone lesions are seen involving the ribs. There is no evidence of pneumothorax or pleural effusion.  Both lungs are clear. Heart size and mediastinal contours are within normal limits. IMPRESSION: No acute fracture or acute intrathoracic process. Electronically Signed   By: Ofilia Neas M.D.   On: 01/25/2022 20:41   ? ?Procedures ?Procedures  ? ? ?Medications Ordered in ED ?Medications  ?lidocaine (LIDODERM) 5 % 1 patch (1 patch Transdermal Patch Applied 01/25/22 2034)  ?cyclobenzaprine (FLEXERIL) tablet 5 mg (5 mg Oral Given 01/25/22 2052)  ? ? ?ED Course/ Medical Decision Making/ A&P ?  ?                        ?Medical Decision Making ?Amount and/or Complexity of Data Reviewed ?Radiology: ordered. ? ?Risk ?Prescription  drug management. ? ? ?56 year old female presents emergency department left upper back pain.  Vitals are stable on arrival.  She has no associated chest pain, shortness of breath or palpitations.  She has a focal area in the left upper back with reproducible tenderness to palpation, palpable muscle spasm, no overlying rash.  X-ray of the ribs and spine are unremarkable.  After Lidoderm patch and muscle relaxer she feels significantly improved.  Plan for musculoskeletal symptomatic treatment with outpatient follow-up.  Very low suspicion for ACS, PE or other acute process at this time given the reproducible musculoskeletal nature.  Patient at this time appears safe and stable for discharge and close outpatient follow up. Discharge plan and strict return to ED precautions discussed, patient verbalizes understanding and agreement. ? ? ? ? ? ? ? ?Final Clinical Impression(s) / ED Diagnoses ?Final diagnoses:  ?Muscle spasm  ? ? ?Rx / DC Orders ?ED Discharge Orders   ? ?      Ordered  ?  lidocaine (LIDODERM) 5 %  Every 24 hours       ? 01/25/22 2144  ?  cyclobenzaprine (FEXMID) 7.5 MG tablet  2 times daily PRN       ? 01/25/22 2224  ? ?  ?  ? ?  ? ? ?  ?Lorelle Gibbs, DO ?01/25/22 2229 ? ?

## 2022-01-25 NOTE — ED Triage Notes (Signed)
Left scapula pain x 2 months. States it feels like a mass. Her mammograms and colonoscopy test have been normal.   ?

## 2022-02-15 ENCOUNTER — Other Ambulatory Visit (HOSPITAL_BASED_OUTPATIENT_CLINIC_OR_DEPARTMENT_OTHER): Payer: Self-pay

## 2022-03-04 ENCOUNTER — Emergency Department (HOSPITAL_BASED_OUTPATIENT_CLINIC_OR_DEPARTMENT_OTHER): Payer: BLUE CROSS/BLUE SHIELD

## 2022-03-04 ENCOUNTER — Other Ambulatory Visit: Payer: Self-pay

## 2022-03-04 ENCOUNTER — Encounter (HOSPITAL_BASED_OUTPATIENT_CLINIC_OR_DEPARTMENT_OTHER): Payer: Self-pay | Admitting: Urology

## 2022-03-04 ENCOUNTER — Inpatient Hospital Stay (HOSPITAL_BASED_OUTPATIENT_CLINIC_OR_DEPARTMENT_OTHER)
Admission: EM | Admit: 2022-03-04 | Discharge: 2022-03-09 | DRG: 871 | Disposition: A | Discharge status: Home / Self Care | Payer: BLUE CROSS/BLUE SHIELD | Attending: Family Medicine | Admitting: Family Medicine

## 2022-03-04 DIAGNOSIS — N136 Pyonephrosis: Secondary | ICD-10-CM | POA: Diagnosis present

## 2022-03-04 DIAGNOSIS — N201 Calculus of ureter: Secondary | ICD-10-CM

## 2022-03-04 DIAGNOSIS — I10 Essential (primary) hypertension: Secondary | ICD-10-CM | POA: Diagnosis present

## 2022-03-04 DIAGNOSIS — I5032 Chronic diastolic (congestive) heart failure: Secondary | ICD-10-CM | POA: Diagnosis present

## 2022-03-04 DIAGNOSIS — D509 Iron deficiency anemia, unspecified: Secondary | ICD-10-CM | POA: Diagnosis present

## 2022-03-04 DIAGNOSIS — E559 Vitamin D deficiency, unspecified: Secondary | ICD-10-CM | POA: Diagnosis present

## 2022-03-04 DIAGNOSIS — N151 Renal and perinephric abscess: Secondary | ICD-10-CM | POA: Diagnosis present

## 2022-03-04 DIAGNOSIS — A419 Sepsis, unspecified organism: Principal | ICD-10-CM | POA: Diagnosis present

## 2022-03-04 DIAGNOSIS — Z888 Allergy status to other drugs, medicaments and biological substances status: Secondary | ICD-10-CM

## 2022-03-04 DIAGNOSIS — Z79899 Other long term (current) drug therapy: Secondary | ICD-10-CM

## 2022-03-04 DIAGNOSIS — E876 Hypokalemia: Secondary | ICD-10-CM

## 2022-03-04 DIAGNOSIS — E785 Hyperlipidemia, unspecified: Secondary | ICD-10-CM | POA: Diagnosis present

## 2022-03-04 DIAGNOSIS — B9689 Other specified bacterial agents as the cause of diseases classified elsewhere: Secondary | ICD-10-CM | POA: Diagnosis present

## 2022-03-04 DIAGNOSIS — N200 Calculus of kidney: Secondary | ICD-10-CM | POA: Diagnosis not present

## 2022-03-04 DIAGNOSIS — N1339 Other hydronephrosis: Secondary | ICD-10-CM

## 2022-03-04 DIAGNOSIS — E119 Type 2 diabetes mellitus without complications: Secondary | ICD-10-CM

## 2022-03-04 DIAGNOSIS — E1165 Type 2 diabetes mellitus with hyperglycemia: Secondary | ICD-10-CM | POA: Diagnosis present

## 2022-03-04 DIAGNOSIS — K760 Fatty (change of) liver, not elsewhere classified: Secondary | ICD-10-CM | POA: Diagnosis present

## 2022-03-04 DIAGNOSIS — I110 Hypertensive heart disease with heart failure: Secondary | ICD-10-CM | POA: Diagnosis present

## 2022-03-04 DIAGNOSIS — N3000 Acute cystitis without hematuria: Secondary | ICD-10-CM

## 2022-03-04 DIAGNOSIS — Z794 Long term (current) use of insulin: Secondary | ICD-10-CM

## 2022-03-04 DIAGNOSIS — Z87442 Personal history of urinary calculi: Secondary | ICD-10-CM

## 2022-03-04 DIAGNOSIS — Z7984 Long term (current) use of oral hypoglycemic drugs: Secondary | ICD-10-CM

## 2022-03-04 DIAGNOSIS — K6812 Psoas muscle abscess: Secondary | ICD-10-CM | POA: Diagnosis present

## 2022-03-04 LAB — COMPREHENSIVE METABOLIC PANEL
ALT: 19 U/L (ref 0–44)
AST: 20 U/L (ref 15–41)
Albumin: 3.3 g/dL — ABNORMAL LOW (ref 3.5–5.0)
Alkaline Phosphatase: 71 U/L (ref 38–126)
Anion gap: 10 (ref 5–15)
BUN: 10 mg/dL (ref 6–20)
CO2: 24 mmol/L (ref 22–32)
Calcium: 8.7 mg/dL — ABNORMAL LOW (ref 8.9–10.3)
Chloride: 100 mmol/L (ref 98–111)
Creatinine, Ser: 1.06 mg/dL — ABNORMAL HIGH (ref 0.44–1.00)
GFR, Estimated: >60 mL/min (ref >60)
Glucose, Bld: 168 mg/dL — ABNORMAL HIGH (ref 70–99)
Potassium: 3.5 mmol/L (ref 3.5–5.1)
Sodium: 134 mmol/L — ABNORMAL LOW (ref 135–145)
Total Bilirubin: 1 mg/dL (ref 0.3–1.2)
Total Protein: 8 g/dL (ref 6.5–8.1)

## 2022-03-04 LAB — CBC WITH DIFFERENTIAL/PLATELET
Abs Immature Granulocytes: 0.04 10*3/uL (ref 0.00–0.07)
Basophils Absolute: 0 10*3/uL (ref 0.0–0.1)
Basophils Relative: 0 %
Eosinophils Absolute: 0 10*3/uL (ref 0.0–0.5)
Eosinophils Relative: 0 %
HCT: 31.6 % — ABNORMAL LOW (ref 36.0–46.0)
Hemoglobin: 10.8 g/dL — ABNORMAL LOW (ref 12.0–15.0)
Immature Granulocytes: 0 %
Lymphocytes Relative: 11 %
Lymphs Abs: 1.4 10*3/uL (ref 0.7–4.0)
MCH: 28.6 pg (ref 26.0–34.0)
MCHC: 34.2 g/dL (ref 30.0–36.0)
MCV: 83.8 fL (ref 80.0–100.0)
Monocytes Absolute: 1.1 10*3/uL — ABNORMAL HIGH (ref 0.1–1.0)
Monocytes Relative: 8 %
Neutro Abs: 10.9 10*3/uL — ABNORMAL HIGH (ref 1.7–7.7)
Neutrophils Relative %: 81 %
Platelets: 299 10*3/uL (ref 150–400)
RBC: 3.77 MIL/uL — ABNORMAL LOW (ref 3.87–5.11)
RDW: 15.4 % (ref 11.5–15.5)
WBC: 13.4 10*3/uL — ABNORMAL HIGH (ref 4.0–10.5)
nRBC: 0 % (ref 0.0–0.2)

## 2022-03-04 LAB — URINALYSIS, ROUTINE W REFLEX MICROSCOPIC
Bilirubin Urine: NEGATIVE
Glucose, UA: NEGATIVE mg/dL
Ketones, ur: NEGATIVE mg/dL
Nitrite: POSITIVE — AB
Protein, ur: 100 mg/dL — AB
Specific Gravity, Urine: 1.01 (ref 1.005–1.030)
pH: 6 (ref 5.0–8.0)

## 2022-03-04 LAB — PREGNANCY, URINE: Preg Test, Ur: NEGATIVE

## 2022-03-04 LAB — URINALYSIS, MICROSCOPIC (REFLEX)

## 2022-03-04 LAB — LACTIC ACID, PLASMA: Lactic Acid, Venous: 1.1 mmol/L (ref 0.5–1.9)

## 2022-03-04 LAB — LIPASE, BLOOD: Lipase: 24 U/L (ref 11–51)

## 2022-03-04 MED ORDER — ONDANSETRON HCL 4 MG/2ML IJ SOLN
4.0000 mg | Freq: Once | INTRAMUSCULAR | Status: AC
  Administered 2022-03-04: 4 mg via INTRAVENOUS
  Filled 2022-03-04: qty 2

## 2022-03-04 MED ORDER — SODIUM CHLORIDE 0.9 % IV BOLUS
1000.0000 mL | Freq: Once | INTRAVENOUS | Status: AC
  Administered 2022-03-05: 1000 mL via INTRAVENOUS

## 2022-03-04 MED ORDER — SODIUM CHLORIDE 0.9 % IV SOLN: 2.0000 g | Freq: Once | INTRAVENOUS | Status: DC

## 2022-03-04 MED ORDER — ACETAMINOPHEN 500 MG PO TABS
1000.0000 mg | ORAL_TABLET | Freq: Once | ORAL | Status: AC
  Administered 2022-03-04: 1000 mg via ORAL
  Filled 2022-03-04: qty 2

## 2022-03-04 MED ORDER — FENTANYL CITRATE PF 50 MCG/ML IJ SOSY
50.0000 ug | PREFILLED_SYRINGE | Freq: Once | INTRAMUSCULAR | Status: AC
  Administered 2022-03-04: 50 ug via INTRAVENOUS
  Filled 2022-03-04: qty 1

## 2022-03-04 MED ORDER — IOHEXOL 300 MG/ML  SOLN
100.0000 mL | Freq: Once | INTRAMUSCULAR | Status: AC | PRN
  Administered 2022-03-04: 100 mL via INTRAVENOUS

## 2022-03-04 MED ORDER — SODIUM CHLORIDE 0.9 % IV BOLUS
1000.0000 mL | Freq: Once | INTRAVENOUS | Status: AC
  Administered 2022-03-04: 1000 mL via INTRAVENOUS

## 2022-03-04 MED ORDER — PIPERACILLIN-TAZOBACTAM 3.375 G IVPB 30 MIN
3.3750 g | Freq: Once | INTRAVENOUS | Status: AC
Start: 2022-03-04 — End: 2022-03-05
  Administered 2022-03-05: 3.375 g via INTRAVENOUS
  Filled 2022-03-04: qty 50

## 2022-03-04 MED ORDER — PIPERACILLIN-TAZOBACTAM 3.375 G IVPB: 3.3750 g | Freq: Three times a day (TID) | INTRAVENOUS | Status: DC

## 2022-03-04 NOTE — ED Provider Notes (Signed)
?[facility] ?Provider Note ? ? ?CSN: [id number] ?Arrival date & time: 03/04/22  2012 ? ?  ? ?History ? ?Chief Complaint  ?Patient presents with  ? Back Pain  ? ? ?Kimberly Herman is a 56 y.o. female. ? ?Left flank pain on and off for the last several weeks.  Felt better after antibiotics that she was given at urgent care.  Muscle relaxants have not helped.  Pain back for the last several days now.  Pain in the left flank radiates around to her left lower quadrant.  Denies any nausea or vomiting.  No chest pain or shortness of breath.  No weakness or tingling in her legs. ? ?The history is provided by the patient.  ? ?  ? ?Home Medications ?Prior to Admission medications   ?Medication Sig Start Date End Date Taking? Authorizing Provider  ?atorvastatin (LIPITOR) 40 MG tablet Take 1 tablet (40 mg total) by mouth daily. 10/01/21 12/30/21  Bo Merino I, NP  ?blood glucose meter kit and supplies KIT Dispense based on patient and insurance preference. Use up to four times daily as directed. (FOR ICD-9 250.00, 250.01). 08/29/19   Azzie Glatter, FNP  ?Blood Pressure Monitoring (BLOOD PRESSURE CUFF) MISC Check blood pressure once daily at the same time and record readings 03/07/17   Scot Jun, FNP  ?candesartan (ATACAND) 32 MG tablet Take 1 tablet (32 mg total) by mouth daily. 12/21/21 12/21/22  Bo Merino I, NP  ?chlorthalidone (HYGROTON) 25 MG tablet Take 1 tablet (25 mg total) by mouth daily. 12/21/21 06/19/22  Bo Merino I, NP  ?cyclobenzaprine (FEXMID) 7.5 MG tablet Take 1 tablet (7.5 mg total) by mouth 2 (two) times daily as needed for muscle spasms. 01/25/22   Horton, Alvin Critchley, DO  ?dicyclomine (BENTYL) 10 MG capsule Take 1 capsule (10 mg total) by mouth 3 (three) times daily as needed for spasms (abdominal pain). 10/19/21   Bo Merino I, NP  ?esomeprazole (NEXIUM) 20 MG capsule Take 1 capsule (20 mg total) by mouth as needed. 06/25/20   Azzie Glatter, FNP   ?gabapentin (NEURONTIN) 300 MG capsule Take 1 capsule (300 mg total) by mouth 3 (three) times daily. 10/01/21   Bo Merino I, NP  ?glucose blood (TRUE METRIX BLOOD GLUCOSE TEST) test strip TEST UP TO FOUR TIMES DAILY 05/11/17   Scot Jun, FNP  ?insulin aspart (NOVOLOG) 100 UNIT/ML injection Inject 10 Units into the skin as needed for high blood sugar (blood sugar >240). 08/29/19   Azzie Glatter, FNP  ?insulin detemir (LEVEMIR) 100 UNIT/ML injection Inject 0.1 mLs (10 Units total) into the skin at bedtime. 10/20/21 01/18/22  Bo Merino I, NP  ?lidocaine (LIDODERM) 5 % Place 1 patch onto the skin daily. Remove & Discard patch within 12 hours or as directed by MD 01/25/22   Horton, Alvin Critchley, DO  ?metFORMIN (GLUCOPHAGE) 1000 MG tablet Take 1 tablet (1,000 mg total) by mouth 2 (two) times daily with a meal. 10/01/21 12/30/21  Passmore, Jake Church I, NP  ?omeprazole (PRILOSEC) 20 MG capsule Take 1 capsule (20 mg total) by mouth daily. 10/19/21   Bo Merino I, NP  ?ondansetron (ZOFRAN-ODT) 4 MG disintegrating tablet Take 1 tablet (4 mg total) by mouth every 8 (eight) hours as needed for nausea. 10/19/21   Bo Merino I, NP  ?potassium chloride SA (KLOR-CON M) 20 MEQ tablet Take 1 tablet (20 mEq total) by mouth 2 (two) times daily. 10/20/21 12/19/21  Bo Merino  I, NP  ?   ? ?Allergies    ?Amlodipine and Carvedilol   ? ?Review of Systems   ?Review of Systems ? ?Physical Exam ?Updated Vital Signs ?BP 139/70 (BP Location: Left Arm)   Pulse (!) 103   Temp (!) 103.2 ?F (39.6 ?C) (Oral)   Resp 16   Ht '5\' 7"'  (1.702 m)   Wt 84.5 kg   SpO2 94%   BMI 29.18 kg/m?  ?Physical Exam ?Vitals and nursing note reviewed.  ?Constitutional:   ?   General: She is not in acute distress. ?   Appearance: She is well-developed. She is not ill-appearing.  ?HENT:  ?   Head: Normocephalic and atraumatic.  ?   Nose: Nose normal.  ?   Mouth/Throat:  ?   Mouth: Mucous membranes are moist.  ?Eyes:  ?   Extraocular  Movements: Extraocular movements intact.  ?   Conjunctiva/sclera: Conjunctivae normal.  ?   Pupils: Pupils are equal, round, and reactive to light.  ?Cardiovascular:  ?   Rate and Rhythm: Normal rate and regular rhythm.  ?   Pulses: Normal pulses.  ?   Heart sounds: Normal heart sounds. No murmur heard. ?Pulmonary:  ?   Effort: Pulmonary effort is normal. No respiratory distress.  ?   Breath sounds: Normal breath sounds.  ?Abdominal:  ?   Palpations: Abdomen is soft.  ?   Tenderness: There is no abdominal tenderness. There is left CVA tenderness.  ?Musculoskeletal:     ?   General: No swelling.  ?   Cervical back: Neck supple.  ?Skin: ?   General: Skin is warm and dry.  ?   Capillary Refill: Capillary refill takes less than 2 seconds.  ?Neurological:  ?   General: No focal deficit present.  ?   Mental Status: She is alert and oriented to person, place, and time.  ?   Cranial Nerves: No cranial nerve deficit.  ?   Sensory: No sensory deficit.  ?   Motor: No weakness.  ?   Coordination: Coordination normal.  ?   Comments: 5+ out of 5 strength throughout, normal sensation, no drift, normal finger-nose-finger, normal speech  ?Psychiatric:     ?   Mood and Affect: Mood normal.  ? ? ?ED Results / Procedures / Treatments   ?Labs ?(all labs ordered are listed, but only abnormal results are displayed) ?Labs Reviewed  ?CBC WITH DIFFERENTIAL/PLATELET - Abnormal; Notable for the following components:  ?    Result Value  ? WBC 13.4 (*)   ? RBC 3.77 (*)   ? Hemoglobin 10.8 (*)   ? HCT 31.6 (*)   ? Neutro Abs 10.9 (*)   ? Monocytes Absolute 1.1 (*)   ? All other components within normal limits  ?COMPREHENSIVE METABOLIC PANEL - Abnormal; Notable for the following components:  ? Sodium 134 (*)   ? Glucose, Bld 168 (*)   ? Creatinine, Ser 1.06 (*)   ? Calcium 8.7 (*)   ? Albumin 3.3 (*)   ? All other components within normal limits  ?CULTURE, BLOOD (ROUTINE X 2)  ?CULTURE, BLOOD (ROUTINE X 2)  ?URINE CULTURE  ?LIPASE, BLOOD   ?URINALYSIS, ROUTINE W REFLEX MICROSCOPIC  ?LACTIC ACID, PLASMA  ?LACTIC ACID, PLASMA  ?PREGNANCY, URINE  ? ? ?EKG ?EKG Interpretation ? ?Date/Time:  Thursday March 04 2022 20:34:49 EDT ?Ventricular Rate:  126 ?PR Interval:  124 ?QRS Duration: 80 ?QT Interval:  302 ?QTC Calculation: 437 ?R Axis:  39 ?Text Interpretation: Sinus tachycardia Minimal voltage criteria for LVH, may be normal variant ( Sokolow-Lyon ) Cannot rule out Anterior infarct , age undetermined Abnormal ECG When compared with ECG of 25-Jan-2022 18:27, PREVIOUS ECG IS PRESENT Confirmed by Lennice Sites [phone]) on 03/04/2022 8:40:52 PM ? ?Radiology ?DG Chest 2 View ? ?Result Date: 03/04/2022 ?CLINICAL DATA:  Left back pain EXAM: CHEST - 2 VIEW COMPARISON:  01/25/2022 FINDINGS: The heart size and mediastinal contours are within normal limits. Both lungs are clear. The visualized skeletal structures are unremarkable. IMPRESSION: No active cardiopulmonary disease. Electronically Signed   By: Donavan Foil M.D.   On: 03/04/2022 20:56  ? ?CT Renal Stone Study ? ?Result Date: 03/04/2022 ?CLINICAL DATA:  Left flank pain EXAM: CT ABDOMEN AND PELVIS WITHOUT CONTRAST TECHNIQUE: Multidetector CT imaging of the abdomen and pelvis was performed following the standard protocol without IV contrast. RADIATION DOSE REDUCTION: This exam was performed according to the departmental dose-optimization program which includes automated exposure control, adjustment of the mA and/or kV according to patient size and/or use of iterative reconstruction technique. COMPARISON:  None. FINDINGS: Lower chest: Lung bases demonstrate no acute consolidation or effusion. Normal cardiac size. Hepatobiliary: Liver is enlarged measuring 21 cm craniocaudad. Diffuse hypodensity consistent with steatosis. No calcified gallstone or biliary dilatation Pancreas: Unremarkable. No pancreatic ductal dilatation or surrounding inflammatory changes. Spleen: Normal in size without focal abnormality.  Adrenals/Urinary Tract: Adrenal glands are normal. The right kidney shows no hydronephrosis. Large lower pole stones on the left measuring to 28 mm. There may be minimal hydronephrosis. There are multiple stacked stones in the dis

## 2022-03-04 NOTE — Progress Notes (Signed)
Pharmacy Antibiotic Note ? ?Kimberly Herman is a 56 y.o. female admitted on 03/04/2022 with  intra-abdominal infection . Pharmacy has been consulted for Zosyn dosing. ? ?WBC 13.4, fever of 103.47F on presentation, serum creatinine 1.06. ? ?Plan: ?Zosyn 3.375 g IV q8h  ?F/u clinical course, fever curve, cultures ? ?Height: '5\' 7"'$  (170.2 cm) ?Weight: 84.5 kg (186 lb 4.6 oz) ?IBW/kg (Calculated) : 61.6 ? ?Temp (24hrs), Avg:101.4 ?F (38.6 ?C), Min:99.5 ?F (37.5 ?C), Max:103.2 ?F (39.6 ?C) ? ?Recent Labs  ?Lab 03/04/22 ?2145  ?WBC 13.4*  ?CREATININE 1.06*  ?  ?Estimated Creatinine Clearance: 65.4 mL/min (A) (by C-G formula based on SCr of 1.06 mg/dL (H)).   ? ?Allergies  ?Allergen Reactions  ? Amlodipine Swelling  ? Carvedilol Diarrhea  ? ? ?Antimicrobials this admission: ?Zosyn 4/13 >>  ? ?Dose adjustments this admission: ?N/A ? ?Microbiology results: ?4/13 UCx: ordered  ? ?Thank you for allowing pharmacy to be a part of this patient?s care. ? ?Zenaida Deed, PharmD ?PGY1 Acute Care Pharmacy Resident  ?Phone: [phone] ?03/04/2022  11:17 PM ? ?Please check [email or web address] for unit-specific pharmacy phone numbers. ? ? ?

## 2022-03-04 NOTE — ED Notes (Signed)
Pt unable to give a urine sample at this time. RN Misty informed of pt temp of 103.2. Also pt doesn't want to wear her BP cuff continuously. ?

## 2022-03-04 NOTE — ED Notes (Signed)
Pt. Comes in tonight with C/O lower back pain stating she has had this x 2 weeks with not getting any relief from the meds she was prescribed before.  Pt. Is having more pain.  Pt. Has no shob. And no chest pain per Pt. ?

## 2022-03-05 ENCOUNTER — Inpatient Hospital Stay (HOSPITAL_COMMUNITY): Payer: BLUE CROSS/BLUE SHIELD

## 2022-03-05 DIAGNOSIS — I5032 Chronic diastolic (congestive) heart failure: Secondary | ICD-10-CM | POA: Diagnosis not present

## 2022-03-05 DIAGNOSIS — E1165 Type 2 diabetes mellitus with hyperglycemia: Secondary | ICD-10-CM | POA: Diagnosis not present

## 2022-03-05 DIAGNOSIS — R6520 Severe sepsis without septic shock: Secondary | ICD-10-CM

## 2022-03-05 DIAGNOSIS — N200 Calculus of kidney: Secondary | ICD-10-CM | POA: Diagnosis present

## 2022-03-05 DIAGNOSIS — E785 Hyperlipidemia, unspecified: Secondary | ICD-10-CM | POA: Diagnosis not present

## 2022-03-05 DIAGNOSIS — Z7984 Long term (current) use of oral hypoglycemic drugs: Secondary | ICD-10-CM | POA: Diagnosis not present

## 2022-03-05 DIAGNOSIS — N201 Calculus of ureter: Secondary | ICD-10-CM

## 2022-03-05 DIAGNOSIS — Z794 Long term (current) use of insulin: Secondary | ICD-10-CM | POA: Diagnosis not present

## 2022-03-05 DIAGNOSIS — A419 Sepsis, unspecified organism: Secondary | ICD-10-CM | POA: Diagnosis present

## 2022-03-05 DIAGNOSIS — Z87442 Personal history of urinary calculi: Secondary | ICD-10-CM | POA: Diagnosis not present

## 2022-03-05 DIAGNOSIS — B9689 Other specified bacterial agents as the cause of diseases classified elsewhere: Secondary | ICD-10-CM | POA: Diagnosis not present

## 2022-03-05 DIAGNOSIS — Z79899 Other long term (current) drug therapy: Secondary | ICD-10-CM | POA: Diagnosis not present

## 2022-03-05 DIAGNOSIS — Z888 Allergy status to other drugs, medicaments and biological substances status: Secondary | ICD-10-CM | POA: Diagnosis not present

## 2022-03-05 DIAGNOSIS — E876 Hypokalemia: Secondary | ICD-10-CM

## 2022-03-05 DIAGNOSIS — K760 Fatty (change of) liver, not elsewhere classified: Secondary | ICD-10-CM | POA: Diagnosis not present

## 2022-03-05 DIAGNOSIS — N151 Renal and perinephric abscess: Secondary | ICD-10-CM | POA: Diagnosis not present

## 2022-03-05 DIAGNOSIS — K6812 Psoas muscle abscess: Secondary | ICD-10-CM | POA: Diagnosis present

## 2022-03-05 DIAGNOSIS — E782 Mixed hyperlipidemia: Secondary | ICD-10-CM | POA: Diagnosis not present

## 2022-03-05 DIAGNOSIS — I10 Essential (primary) hypertension: Secondary | ICD-10-CM | POA: Diagnosis not present

## 2022-03-05 DIAGNOSIS — I110 Hypertensive heart disease with heart failure: Secondary | ICD-10-CM | POA: Diagnosis not present

## 2022-03-05 DIAGNOSIS — E559 Vitamin D deficiency, unspecified: Secondary | ICD-10-CM | POA: Diagnosis not present

## 2022-03-05 DIAGNOSIS — D509 Iron deficiency anemia, unspecified: Secondary | ICD-10-CM | POA: Diagnosis not present

## 2022-03-05 DIAGNOSIS — N136 Pyonephrosis: Secondary | ICD-10-CM | POA: Diagnosis not present

## 2022-03-05 LAB — PHOSPHORUS: Phosphorus: 3.5 mg/dL (ref 2.5–4.6)

## 2022-03-05 LAB — CBC WITH DIFFERENTIAL/PLATELET
Abs Immature Granulocytes: 0.05 10*3/uL (ref 0.00–0.07)
Basophils Absolute: 0.1 10*3/uL (ref 0.0–0.1)
Basophils Relative: 0 %
Eosinophils Absolute: 0 10*3/uL (ref 0.0–0.5)
Eosinophils Relative: 0 %
HCT: 28.7 % — ABNORMAL LOW (ref 36.0–46.0)
Hemoglobin: 9.3 g/dL — ABNORMAL LOW (ref 12.0–15.0)
Immature Granulocytes: 0 %
Lymphocytes Relative: 14 %
Lymphs Abs: 1.6 10*3/uL (ref 0.7–4.0)
MCH: 28.9 pg (ref 26.0–34.0)
MCHC: 32.4 g/dL (ref 30.0–36.0)
MCV: 89.1 fL (ref 80.0–100.0)
Monocytes Absolute: 1 10*3/uL (ref 0.1–1.0)
Monocytes Relative: 9 %
Neutro Abs: 9.3 10*3/uL — ABNORMAL HIGH (ref 1.7–7.7)
Neutrophils Relative %: 77 %
Platelets: 238 10*3/uL (ref 150–400)
RBC: 3.22 MIL/uL — ABNORMAL LOW (ref 3.87–5.11)
RDW: 15.7 % — ABNORMAL HIGH (ref 11.5–15.5)
WBC: 12.1 10*3/uL — ABNORMAL HIGH (ref 4.0–10.5)
nRBC: 0 % (ref 0.0–0.2)

## 2022-03-05 LAB — MAGNESIUM: Magnesium: 1.5 mg/dL — ABNORMAL LOW (ref 1.7–2.4)

## 2022-03-05 LAB — COMPREHENSIVE METABOLIC PANEL
ALT: 16 U/L (ref 0–44)
AST: 14 U/L — ABNORMAL LOW (ref 15–41)
Albumin: 2.9 g/dL — ABNORMAL LOW (ref 3.5–5.0)
Alkaline Phosphatase: 57 U/L (ref 38–126)
Anion gap: 7 (ref 5–15)
BUN: 9 mg/dL (ref 6–20)
CO2: 22 mmol/L (ref 22–32)
Calcium: 8 mg/dL — ABNORMAL LOW (ref 8.9–10.3)
Chloride: 110 mmol/L (ref 98–111)
Creatinine, Ser: 0.94 mg/dL (ref 0.44–1.00)
GFR, Estimated: >60 mL/min (ref >60)
Glucose, Bld: 155 mg/dL — ABNORMAL HIGH (ref 70–99)
Potassium: 3.4 mmol/L — ABNORMAL LOW (ref 3.5–5.1)
Sodium: 139 mmol/L (ref 135–145)
Total Bilirubin: 1.3 mg/dL — ABNORMAL HIGH (ref 0.3–1.2)
Total Protein: 6.7 g/dL (ref 6.5–8.1)

## 2022-03-05 LAB — HIV ANTIBODY (ROUTINE TESTING W REFLEX): HIV Screen 4th Generation wRfx: NONREACTIVE

## 2022-03-05 LAB — MRSA NEXT GEN BY PCR, NASAL: MRSA by PCR Next Gen: NOT DETECTED

## 2022-03-05 LAB — PROTIME-INR
INR: 1.5 — ABNORMAL HIGH (ref 0.8–1.2)
Prothrombin Time: 18 seconds — ABNORMAL HIGH (ref 11.4–15.2)

## 2022-03-05 LAB — LACTIC ACID, PLASMA: Lactic Acid, Venous: 0.9 mmol/L (ref 0.5–1.9)

## 2022-03-05 LAB — GLUCOSE, CAPILLARY: Glucose-Capillary: 165 mg/dL — ABNORMAL HIGH (ref 70–99)

## 2022-03-05 MED ORDER — FENTANYL CITRATE (PF) 100 MCG/2ML IJ SOLN
INTRAMUSCULAR | Status: AC | PRN
  Administered 2022-03-05: 50 ug via INTRAVENOUS

## 2022-03-05 MED ORDER — ATORVASTATIN CALCIUM 40 MG PO TABS
40.0000 mg | ORAL_TABLET | Freq: Every day | ORAL | Status: DC
  Administered 2022-03-06 – 2022-03-09 (×4): 40 mg via ORAL
  Filled 2022-03-05 (×4): qty 1

## 2022-03-05 MED ORDER — HYDROMORPHONE HCL 1 MG/ML IJ SOLN
0.5000 mg | INTRAMUSCULAR | Status: DC | PRN
  Administered 2022-03-05 – 2022-03-07 (×4): 0.5 mg via INTRAVENOUS
  Filled 2022-03-05 (×2): qty 1
  Filled 2022-03-05: qty 0.5
  Filled 2022-03-05: qty 1
  Filled 2022-03-05: qty 0.5

## 2022-03-05 MED ORDER — LACTATED RINGERS IV SOLN: INTRAVENOUS | Status: DC

## 2022-03-05 MED ORDER — FENTANYL CITRATE PF 50 MCG/ML IJ SOSY
50.0000 ug | PREFILLED_SYRINGE | Freq: Once | INTRAMUSCULAR | Status: AC
  Administered 2022-03-05: 50 ug via INTRAVENOUS
  Filled 2022-03-05: qty 1

## 2022-03-05 MED ORDER — CHLORHEXIDINE GLUCONATE CLOTH 2 % EX PADS
6.0000 | MEDICATED_PAD | Freq: Every day | CUTANEOUS | Status: DC
  Administered 2022-03-05: 6 via TOPICAL

## 2022-03-05 MED ORDER — DIPHENHYDRAMINE HCL 50 MG/ML IJ SOLN
INTRAMUSCULAR | Status: AC
  Filled 2022-03-05: qty 1

## 2022-03-05 MED ORDER — ACETAMINOPHEN 325 MG PO TABS
650.0000 mg | ORAL_TABLET | Freq: Four times a day (QID) | ORAL | Status: DC | PRN
  Administered 2022-03-05: 650 mg via ORAL
  Filled 2022-03-05: qty 2

## 2022-03-05 MED ORDER — HYDROMORPHONE HCL 1 MG/ML IJ SOLN
0.5000 mg | INTRAMUSCULAR | Status: DC | PRN
Start: 2022-03-05 — End: 2022-03-05
  Administered 2022-03-05 (×2): 0.5 mg via INTRAVENOUS
  Filled 2022-03-05 (×2): qty 1

## 2022-03-05 MED ORDER — FENTANYL CITRATE (PF) 100 MCG/2ML IJ SOLN
INTRAMUSCULAR | Status: AC
  Filled 2022-03-05: qty 6

## 2022-03-05 MED ORDER — DIPHENHYDRAMINE HCL 50 MG/ML IJ SOLN
12.5000 mg | Freq: Once | INTRAMUSCULAR | Status: AC
  Administered 2022-03-05: 12.5 mg via INTRAVENOUS

## 2022-03-05 MED ORDER — FLUMAZENIL 0.5 MG/5ML IV SOLN
INTRAVENOUS | Status: AC
  Filled 2022-03-05: qty 5

## 2022-03-05 MED ORDER — MIDAZOLAM HCL 2 MG/2ML IJ SOLN
INTRAMUSCULAR | Status: AC
  Filled 2022-03-05: qty 6

## 2022-03-05 MED ORDER — POTASSIUM CHLORIDE 10 MEQ/100ML IV SOLN
10.0000 meq | INTRAVENOUS | Status: AC
  Administered 2022-03-05 (×3): 10 meq via INTRAVENOUS
  Filled 2022-03-05 (×3): qty 100

## 2022-03-05 MED ORDER — LIP MEDEX EX OINT
1.0000 "application " | TOPICAL_OINTMENT | CUTANEOUS | Status: DC | PRN
  Filled 2022-03-05: qty 7

## 2022-03-05 MED ORDER — MIDAZOLAM HCL 2 MG/2ML IJ SOLN
INTRAMUSCULAR | Status: AC | PRN
  Administered 2022-03-05: 1 mg via INTRAVENOUS

## 2022-03-05 MED ORDER — ONDANSETRON HCL 4 MG/2ML IJ SOLN
4.0000 mg | Freq: Four times a day (QID) | INTRAMUSCULAR | Status: DC | PRN
  Administered 2022-03-05 (×2): 4 mg via INTRAVENOUS
  Filled 2022-03-05 (×3): qty 2

## 2022-03-05 MED ORDER — MAGNESIUM SULFATE 2 GM/50ML IV SOLN
2.0000 g | INTRAVENOUS | Status: AC
  Administered 2022-03-05: 2 g via INTRAVENOUS
  Filled 2022-03-05: qty 50

## 2022-03-05 MED ORDER — LIDOCAINE HCL (PF) 1 % IJ SOLN
INTRAMUSCULAR | Status: AC | PRN
  Administered 2022-03-05: 10 mL via INTRADERMAL

## 2022-03-05 MED ORDER — NALOXONE HCL 0.4 MG/ML IJ SOLN
INTRAMUSCULAR | Status: AC
  Filled 2022-03-05: qty 1

## 2022-03-05 MED ORDER — SODIUM CHLORIDE 0.9 % IV SOLN
2.0000 g | INTRAVENOUS | Status: DC
  Administered 2022-03-05 – 2022-03-07 (×3): 2 g via INTRAVENOUS
  Filled 2022-03-05 (×4): qty 20

## 2022-03-05 MED ORDER — SODIUM CHLORIDE 0.9 % IV SOLN
1.0000 g | Freq: Three times a day (TID) | INTRAVENOUS | Status: DC
  Administered 2022-03-05: 1 g via INTRAVENOUS
  Filled 2022-03-05 (×3): qty 20

## 2022-03-05 MED ORDER — ENOXAPARIN SODIUM 40 MG/0.4ML IJ SOSY: 40.0000 mg | PREFILLED_SYRINGE | INTRAMUSCULAR | Status: DC

## 2022-03-05 MED ORDER — OXYCODONE HCL 5 MG PO TABS
5.0000 mg | ORAL_TABLET | Freq: Four times a day (QID) | ORAL | Status: DC | PRN
  Administered 2022-03-05 – 2022-03-06 (×3): 5 mg via ORAL
  Filled 2022-03-05 (×2): qty 1

## 2022-03-05 MED ORDER — SENNOSIDES-DOCUSATE SODIUM 8.6-50 MG PO TABS
1.0000 | ORAL_TABLET | Freq: Every day | ORAL | Status: DC
  Administered 2022-03-05 – 2022-03-08 (×4): 1 via ORAL
  Filled 2022-03-05 (×4): qty 1

## 2022-03-05 MED ORDER — POLYETHYLENE GLYCOL 3350 17 G PO PACK
17.0000 g | PACK | Freq: Every day | ORAL | Status: DC | PRN
Start: 2022-03-05 — End: 2022-03-09

## 2022-03-05 NOTE — Assessment & Plan Note (Addendum)
Patient is on atorvastatin as an outpatient. Continue home atorvastatin. ?

## 2022-03-05 NOTE — Progress Notes (Signed)
Pharmacy Antibiotic Note ? ?Kimberly Herman is a 55 y.o. female admitted on 03/04/2022 with  sepsis .  ?4/13 Abdominal CT shows large abscess involving L perinephric space/L retroperitoneum/L psoas muscle ?She also consistently had citrobacter koseri in previous urine cultures from 2-3 years ago that have been resistant to Zosyn. ?Pharmacy has been consulted to change to Meropenem dosing based on previous cx data.  ? ?Plan: ?Zosyn 3.375 g IV q8h  ?F/u clinical course, fever curve, cultures ? ?Height: '5\' 7"'$  (170.2 cm) ?Weight: 84.5 kg (186 lb 4.6 oz) ?IBW/kg (Calculated) : 61.6 ? ?Temp (24hrs), Avg:100.2 ?F (37.9 ?C), Min:97.9 ?F (36.6 ?C), Max:103.2 ?F (39.6 ?C) ? ?Recent Labs  ?Lab 03/04/22 ?2145 03/04/22 ?2314  ?WBC 13.4*  --   ?CREATININE 1.06*  --   ?LATICACIDVEN  --  1.1  ? ?  ?Estimated Creatinine Clearance: 65.4 mL/min (A) (by C-G formula based on SCr of 1.06 mg/dL (H)).   ? ?Allergies  ?Allergen Reactions  ? Amlodipine Swelling  ? Carvedilol Diarrhea  ? ? ?Antimicrobials this admission: ?Zosyn 4/13 >> 4/14 ?4/14 Meropenem >> ? ?Dose adjustments this admission: ? ?Microbiology results: ?4/13 BCx: ?4/13 UCx: ordered  ? ?Thank you for allowing pharmacy to be a part of this patient?s care. ? ?Netta Cedars, PharmD, BCPS ?03/05/2022  1:47 AM ? ?Please check [email or web address] for unit-specific pharmacy phone numbers. ? ? ?

## 2022-03-05 NOTE — H&P (Addendum)
?History and Physical ? ?Kimberly Herman YBR:[id number] DOB: [birth year] DOA: 03/04/2022 ? ?Referring physician: Dr. Laverta Baltimore, [place]  ?PCP: Teena Dunk, NP  ?Outpatient Specialists: Cardiology ?Patient coming from: Home through [facility] ED; ED to ED transfer to [facility] ED ? ?Chief Complaint: Left flank pain.  ? ?HPI: Kimberly Herman is a 56 y.o. female with medical history significant for essential hypertension, hyperlipidemia, type 2 diabetes, HFpEF grade 1 diastolic dysfunction who presented to [facility] ED with gradually worsening left flank pain, intermittently for the past several weeks.  Left flank pain radiates to her left lower quadrant abdomen.  No nausea or vomiting.  No weakness or tingling in her legs.  Work-up in the ED revealed multiple stacked stones within the distal ureter over 3.1 cm in length.  There is also suspected 4.5 cm mass of complex collection at the lower pole of the left kidney with slight dense moderate volume fluid enlargement.  Differential per radiology includes renal abscess, inferior pole mass/hemorrhage.  EDP discussed with urology Dr. Milford Cage who recommended transfer to [facility] long ED for further evaluation and possible stent placement after drainage of the abscess by IR.  Dr. Milford Cage discussed with IR, plan is to drain the abscess with percutaneous drain nephrostomy tube placement by IR on 03/05/22.  Patient made NPO and pharmacological DVT prophylaxis held in anticipation for procedures. ? ?She was started on IV fluid and empiric IV antibiotics.  Urology and IR will consult.  TRH, hospitalist service, was asked to admit. ? ?ED Course: Tmax 103.2.  BP 113/58, pulse 78, respiratory 16, O2 saturation 93% on room air. ? ?Review of Systems: ?Review of systems as noted in the HPI. All other systems reviewed and are negative. ? ? ?Past Medical History:  ?Diagnosis Date  ? Diabetes mellitus without complication ([place])   ? Hypertension   ? Menopause 2015  ? Vaginal polyp 10/2019  ? Vitamin D deficiency  08/2019  ? ?History reviewed. No pertinent surgical history. ? ?Social History:  reports that she has never smoked. She has never used smokeless tobacco. She reports that she does not currently use alcohol. She reports that she does not use drugs. ? ? ?Allergies  ?Allergen Reactions  ? Amlodipine Swelling  ? Carvedilol Diarrhea  ? ? ?Family History  ?Problem Relation Age of Onset  ? Cancer Mother   ?  ? ? ?Prior to Admission medications   ?Medication Sig Start Date End Date Taking? Authorizing Provider  ?atorvastatin (LIPITOR) 40 MG tablet Take 1 tablet (40 mg total) by mouth daily. 10/01/21 12/30/21  Bo Merino I, NP  ?blood glucose meter kit and supplies KIT Dispense based on patient and insurance preference. Use up to four times daily as directed. (FOR ICD-9 250.00, 250.01). 08/29/19   Azzie Glatter, FNP  ?Blood Pressure Monitoring (BLOOD PRESSURE CUFF) MISC Check blood pressure once daily at the same time and record readings 03/07/17   Scot Jun, FNP  ?candesartan (ATACAND) 32 MG tablet Take 1 tablet (32 mg total) by mouth daily. 12/21/21 12/21/22  Bo Merino I, NP  ?chlorthalidone (HYGROTON) 25 MG tablet Take 1 tablet (25 mg total) by mouth daily. 12/21/21 06/19/22  Bo Merino I, NP  ?cyclobenzaprine (FEXMID) 7.5 MG tablet Take 1 tablet (7.5 mg total) by mouth 2 (two) times daily as needed for muscle spasms. 01/25/22   Horton, Alvin Critchley, DO  ?dicyclomine (BENTYL) 10 MG capsule Take 1 capsule (10 mg total) by mouth 3 (three) times daily as needed for  spasms (abdominal pain). 10/19/21   Bo Merino I, NP  ?esomeprazole (NEXIUM) 20 MG capsule Take 1 capsule (20 mg total) by mouth as needed. 06/25/20   Azzie Glatter, FNP  ?gabapentin (NEURONTIN) 300 MG capsule Take 1 capsule (300 mg total) by mouth 3 (three) times daily. 10/01/21   Bo Merino I, NP  ?glucose blood (TRUE METRIX BLOOD GLUCOSE TEST) test strip TEST UP TO FOUR TIMES DAILY 05/11/17   Scot Jun, FNP  ?insulin  aspart (NOVOLOG) 100 UNIT/ML injection Inject 10 Units into the skin as needed for high blood sugar (blood sugar >240). 08/29/19   Azzie Glatter, FNP  ?insulin detemir (LEVEMIR) 100 UNIT/ML injection Inject 0.1 mLs (10 Units total) into the skin at bedtime. 10/20/21 01/18/22  Bo Merino I, NP  ?lidocaine (LIDODERM) 5 % Place 1 patch onto the skin daily. Remove & Discard patch within 12 hours or as directed by MD 01/25/22   Horton, Alvin Critchley, DO  ?metFORMIN (GLUCOPHAGE) 1000 MG tablet Take 1 tablet (1,000 mg total) by mouth 2 (two) times daily with a meal. 10/01/21 12/30/21  Passmore, Jake Church I, NP  ?omeprazole (PRILOSEC) 20 MG capsule Take 1 capsule (20 mg total) by mouth daily. 10/19/21   Bo Merino I, NP  ?ondansetron (ZOFRAN-ODT) 4 MG disintegrating tablet Take 1 tablet (4 mg total) by mouth every 8 (eight) hours as needed for nausea. 10/19/21   Bo Merino I, NP  ?potassium chloride SA (KLOR-CON M) 20 MEQ tablet Take 1 tablet (20 mEq total) by mouth 2 (two) times daily. 10/20/21 12/19/21  Bo Merino I, NP  ? ? ?Physical Exam: ?BP 135/69   Pulse 91   Temp 97.9 ?F (36.6 ?C) (Oral)   Resp 17   Ht _0  (1.702 m)   Wt 84.5 kg   SpO2 97%   BMI 29.18 kg/m?  ? ?General: 56 y.o. year-old female well developed well nourished in no acute distress.  Alert and oriented x3. ?Cardiovascular: Regular rate and rhythm with no rubs or gallops.  No thyromegaly or JVD noted.  No lower extremity edema. 2/4 pulses in all 4 extremities. ?Respiratory: Clear to auscultation with no wheezes or rales. Good inspiratory effort. ?Abdomen: Soft with left flank tenderness nondistended with normal bowel sounds x4 quadrants. ?Muskuloskeletal: No cyanosis, clubbing or edema noted bilaterally ?Neuro: CN II-XII intact, strength, sensation, reflexes ?Skin: No ulcerative lesions noted or rashes ?Psychiatry: Judgement and insight appear normal. Mood is appropriate for condition and setting ?   ?   ?   ?Labs on Admission:   ?Basic Metabolic Panel: ?Recent Labs  ?Lab 03/04/22 ?2145  ?NA 134*  ?K 3.5  ?CL 100  ?CO2 24  ?GLUCOSE 168*  ?BUN 10  ?CREATININE 1.06*  ?CALCIUM 8.7*  ? ?Liver Function Tests: ?Recent Labs  ?Lab 03/04/22 ?2145  ?AST 20  ?ALT 19  ?ALKPHOS 71  ?BILITOT 1.0  ?PROT 8.0  ?ALBUMIN 3.3*  ? ?Recent Labs  ?Lab 03/04/22 ?2145  ?LIPASE 24  ? ?No results for input(s): AMMONIA in the last 168 hours. ?CBC: ?Recent Labs  ?Lab 03/04/22 ?2145  ?WBC 13.4*  ?NEUTROABS 10.9*  ?HGB 10.8*  ?HCT 31.6*  ?MCV 83.8  ?PLT 299  ? ?Cardiac Enzymes: ?No results for input(s): CKTOTAL, CKMB, CKMBINDEX, TROPONINI in the last 168 hours. ? ?BNP (last 3 results) ?No results for input(s): BNP in the last 8760 hours. ? ?ProBNP (last 3 results) ?No results for input(s): PROBNP in the last 8760 hours. ? ?CBG: ?No  results for input(s): GLUCAP in the last 168 hours. ? ?Radiological Exams on Admission: ?DG Chest 2 View ? ?Result Date: 03/04/2022 ?CLINICAL DATA:  Left back pain EXAM: CHEST - 2 VIEW COMPARISON:  01/25/2022 FINDINGS: The heart size and mediastinal contours are within normal limits. Both lungs are clear. The visualized skeletal structures are unremarkable. IMPRESSION: No active cardiopulmonary disease. Electronically Signed   By: Donavan Foil M.D.   On: 03/04/2022 20:56  ? ?CT ABDOMEN PELVIS W CONTRAST ? ?Result Date: 03/05/2022 ?CLINICAL DATA:  Low back pain EXAM: CT ABDOMEN AND PELVIS WITH CONTRAST TECHNIQUE: Multidetector CT imaging of the abdomen and pelvis was performed using the standard protocol following bolus administration of intravenous contrast. RADIATION DOSE REDUCTION: This exam was performed according to the departmental dose-optimization program which includes automated exposure control, adjustment of the mA and/or kV according to patient size and/or use of iterative reconstruction technique. CONTRAST:  158m OMNIPAQUE IOHEXOL 300 MG/ML  SOLN COMPARISON:  CT 03/04/2022 FINDINGS: Lower chest: Lung bases demonstrate no acute  consolidation or pleural effusion. Normal cardiac size. Hepatobiliary: Hepatic steatosis. No calcified gallstone or biliary dilatation Pancreas: Unremarkable. No pancreatic ductal dilatation or surrounding inflammatory chan

## 2022-03-05 NOTE — Assessment & Plan Note (Addendum)
Large abscess measuring 7.6 x 6.7 x 4.4 cm. IR consulted for percutaneous drain placement. Urine/blood cultures obtained. Empiric meropenem initiated on admission. Percutaneous drain placed by IR on 4/14. Wound culture with abundant GNR. Urine and wound cultures significant for Citrobacter Koseri. Switched from meropenem to Ceftriaxone. Ceftriaxone was deescalated to Cefazolin after culture sensitivities were available and finally to cefadroxil for 14 days on discharge per ID recommendations. Patient to follow-up with IR and ID as an outpatient. ?

## 2022-03-05 NOTE — Procedures (Signed)
Interventional Radiology Procedure Note ? ?Procedure: CT guided left perinephric drain placement ? ?Findings: Please refer to procedural dictation for full description. 10 Fr pigtail placed in perinephric abscess.  Approximately 80 mL purulent aspirate.  No safe window for percutaneous nephrostomy access at this time.  Drain placed to bulb suction. ? ?Complications: None immediate ? ?Estimated Blood Loss: < 5 mL ? ?Recommendations: ?Keep drain to bulb suction. ?Follow up cultures. ?As there was no safe window for percutaneous nephrostomy at this time due to abscess/bowel guarding lower pole access, in addition to the fact that there was no significant hydronephrosis, IR will be available for nephrostomy placement for PCNL once operative plan is formulated, or sooner if the patient declines from a urosepsis standpoint.  We will plan to allow decompression over the weekend and see how she responds to continued antibiotics.  Consider repeat CT abdomen/pelvis on Monday 4/17 to assess resolution of abscess and target for percutaneous nephrostomy access. ? ? ?Ruthann Cancer, MD ?Pager: [phone] ? ? ? ?

## 2022-03-05 NOTE — Assessment & Plan Note (Addendum)
Patient is on chlorthalidone as an outpatient. Previously prescribed candesartan but listed as not taking. Blood pressure was soft on admission and candesartan and chlorthalidone were held. Blood pressure elevated so can resume home regimen on discharge. ?

## 2022-03-05 NOTE — Progress Notes (Signed)
? ?PROGRESS NOTE ? ? ? ?Kimberly Herman  HYI:[id number] DOB: 1966/09/15 DOA: 03/04/2022 ?PCP: Bo Merino I, NP ? ? ?Brief Narrative: ?Kimberly Herman is a 56 y.o. female with a history of hypertension, hyperlipidemia, diabetes, chronic diastolic heart failure. Patient presented secondary to left flank pain and found to have a large left perinephric/left psoas abscess with associated infected stone, meeting sepsis criteria. Meropenem initiated. IR and urology consulted. ? ? ?Assessment and Plan: ?* Sepsis ([place]) ?Secondary to infected stone and kidney abscess. Blood and urine cultures obtained on admission. Empiric antibiotics initiated targeting urinary source. Sepsis physiology improved with initiation of antibiotics. ?-See problem, Perinephric abscess ? ?Ureteral calculus, left ?Associated mild hydronephrosis. Multiple stones. Large. Urology consulted with recommendation for nephrostomy tube. ?-Follow-up IR nephrostomy tube ? ?Psoas abscess, left ([place]) ?See problem, Perinephric abscess ? ?Perinephric abscess ?Large abscess measuring 7.6 x 6.7 x 4.4 cm. IR consulted for percutaneous drain placement. Urine/blood cultures obtained. Empiric meropenem initiated on admission. ?-Continue meropenem ?-Follow-up blood and urine cultures; anticipate wound cultures after drainage ?-Follow-up IR recommendations/management ?-Dilaudid IV prn while NPO for pain management ? ?ANEMIA, IRON DEFICIENCY, UNSPEC. ?Hemoglobin of 10.8 on admission. Currently stable. ?-CBC in AM ? ?Hypomagnesemia ?Given magnesium supplementation ? ?Hypokalemia ?Given potassium supplementation ? ?Hyperlipidemia ?Patient is on atorvastatin as an outpatient. ?-Resume home atorvastatin ? ?HTN (hypertension) ?Patient is on chlorthalidone as an outpatient. Previously prescribed candesartan but listed as not taking. Blood pressure was soft on admission and chlorthalidone was held. ?-Watch BP ? ?T2DM (type 2 diabetes mellitus) ([place]) ?Well controlled. Hemoglobin  A1C of 6.7% from January 2023. Patient is on metformin as an outpatient. ? ? ? ?DVT prophylaxis: SCDs ?Code Status:   Code Status: Full Code ?Family Communication: Husband at bedside ?Disposition Plan: Discharge home likely in 2-4 days pending IR/Urology recommendations, culture data, transition to oral antibiotics ? ? ?Consultants:  ?Urology ?Interventional radiology ? ?Procedures:  ?None ? ?Antimicrobials: ?Meropenem  ? ? ?Subjective: ?Patient reports continued left flank pain. Overall feels a little better than on admission. ? ?Objective: ?BP (!) 147/69   Pulse 98   Temp 97.8 ?F (36.6 ?C) (Oral)   Resp 16   Ht '5\' 7"'$  (1.702 m)   Wt 84.5 kg   SpO2 91%   BMI 29.18 kg/m?  ? ?Examination: ? ?General exam: Appears calm and comfortable  ?Respiratory system: Clear to auscultation. Respiratory effort normal. ?Cardiovascular system: S1 & S2 heard, RRR. 2/6 systolic murmur. ?Gastrointestinal system: Abdomen is nondistended, soft and nontender. Left flank/back tenderness. Normal bowel sounds heard. ?Central nervous system: Alert and oriented. No focal neurological deficits. ?Musculoskeletal: No edema. No calf tenderness ?Skin: No cyanosis. No rashes ?Psychiatry: Judgement and insight appear normal. Mood & affect appropriate.  ? ? ?Data Reviewed: I have personally reviewed following labs and imaging studies ? ?CBC ?Lab Results  ?Component Value Date  ? WBC 12.1 (H) 03/05/2022  ? RBC 3.22 (L) 03/05/2022  ? HGB 9.3 (L) 03/05/2022  ? HCT 28.7 (L) 03/05/2022  ? MCV 89.1 03/05/2022  ? MCH 28.9 03/05/2022  ? PLT 238 03/05/2022  ? MCHC 32.4 03/05/2022  ? RDW 15.7 (H) 03/05/2022  ? LYMPHSABS 1.6 03/05/2022  ? MONOABS 1.0 03/05/2022  ? EOSABS 0.0 03/05/2022  ? BASOSABS 0.1 03/05/2022  ? ? ? ?Last metabolic panel ?Lab Results  ?Component Value Date  ? NA 139 03/05/2022  ? K 3.4 (L) 03/05/2022  ? CL 110 03/05/2022  ? CO2 22 03/05/2022  ? BUN 9 03/05/2022  ?  CREATININE 0.94 03/05/2022  ? GLUCOSE 155 (H) 03/05/2022  ? GFRNONAA >60  03/05/2022  ? GFRAA 64 01/02/2021  ? CALCIUM 8.0 (L) 03/05/2022  ? PHOS 3.5 03/05/2022  ? PROT 6.7 03/05/2022  ? ALBUMIN 2.9 (L) 03/05/2022  ? LABGLOB 3.9 10/19/2021  ? AGRATIO 1.1 (L) 10/19/2021  ? BILITOT 1.3 (H) 03/05/2022  ? ALKPHOS 57 03/05/2022  ? AST 14 (L) 03/05/2022  ? ALT 16 03/05/2022  ? ANIONGAP 7 03/05/2022  ? ? ?GFR: ?Estimated Creatinine Clearance: 73.8 mL/min (by C-G formula based on SCr of 0.94 mg/dL). ? ?Recent Results (from the past 240 hour(s))  ?MRSA Next Gen by PCR, Nasal     Status: None  ? Collection Time: 03/05/22  2:28 AM  ? Specimen: Nasal Mucosa; Nasal Swab  ?Result Value Ref Range Status  ? MRSA by PCR Next Gen NOT DETECTED NOT DETECTED Final  ?  Comment: (NOTE) ?The GeneXpert MRSA Assay (FDA approved for NASAL specimens only), ?is one component of a comprehensive MRSA colonization surveillance ?program. It is not intended to diagnose MRSA infection nor to guide ?or monitor treatment for MRSA infections. ?Test performance is not FDA approved in patients less than 2 years ?old. ?Performed at [facility], [address] ?[place], [place] [address] ?  ?  ? ? ?Radiology Studies: ?DG Chest 2 View ? ?Result Date: 03/04/2022 ?CLINICAL DATA:  Left back pain EXAM: CHEST - 2 VIEW COMPARISON:  01/25/2022 FINDINGS: The heart size and mediastinal contours are within normal limits. Both lungs are clear. The visualized skeletal structures are unremarkable. IMPRESSION: No active cardiopulmonary disease. Electronically Signed   By: Donavan Foil M.D.   On: 03/04/2022 20:56  ? ?CT ABDOMEN PELVIS W CONTRAST ? ?Result Date: 03/05/2022 ?CLINICAL DATA:  Low back pain EXAM: CT ABDOMEN AND PELVIS WITH CONTRAST TECHNIQUE: Multidetector CT imaging of the abdomen and pelvis was performed using the standard protocol following bolus administration of intravenous contrast. RADIATION DOSE REDUCTION: This exam was performed according to the departmental dose-optimization program which includes  automated exposure control, adjustment of the mA and/or kV according to patient size and/or use of iterative reconstruction technique. CONTRAST:  154m OMNIPAQUE IOHEXOL 300 MG/ML  SOLN COMPARISON:  CT 03/04/2022 FINDINGS: Lower chest: Lung bases demonstrate no acute consolidation or pleural effusion. Normal cardiac size. Hepatobiliary: Hepatic steatosis. No calcified gallstone or biliary dilatation Pancreas: Unremarkable. No pancreatic ductal dilatation or surrounding inflammatory changes. Spleen: Normal in size without focal abnormality. Adrenals/Urinary Tract: Adrenal glands are normal. Right kidney shows no hydronephrosis. Minimal left hydronephrosis and proximal hydroureter with more normal caliber distal ureter. Redemonstrated multiple stacked stones in the distal left ureter proximal to the UVJ. Considerable left perinephric fat stranding. Irregular rim enhancing fluid collection within the left perinephric space with mass effect on the lower pole of the left kidney. This is contiguous with multi loculated rim enhancing collection within the left retroperitoneum involving the psoas muscle. Collection measures approximately 7.6 cm craniocaudad by 6.7 cm AP by 4.4 cm transverse. Edema and asymmetric thickening of the left iliacus muscle without definitive enhancing fluid collection. Bladder is unremarkable. Some excretion of contrast from left kidney on delayed views. Stomach/Bowel: The stomach is nonenlarged. No dilated small bowel. No acute bowel wall thickening. Vascular/Lymphatic: Nonaneurysmal aorta. Mild retroperitoneal adenopathy. Reproductive: Enlarged lobulated uterus with multiple masses consistent with fibroids. No adnexal mass Other: Negative for pelvic effusion or free air Musculoskeletal: No acute osseous abnormality. Enlarged left psoas and iliacus muscles consistent with edema. Left  psoas muscle appears involved by the multiloculated abscess in the retroperitoneum. IMPRESSION: 1. Considerable  left perinephric inflammatory change with large 7.6 by 6.7 x 4.4 cm multiloculated rim enhancing irregular fluid collection within the left perinephric space/left retroperitoneum and partially involving the left psoas

## 2022-03-05 NOTE — Consult Note (Addendum)
? ?Chief Complaint: ?Patient was seen in consultation today for image guided left percutaneous nephrostomy and left perinephric abscess drain placement ? ?Chief Complaint  ?Patient presents with  ? Back Pain  ?  ?Referring Physician(s): ?Newsome,G ? ?Supervising Physician: Michaelle Birks ? ?Patient Status: [facility] - In-pt ? ?History of Present Illness: ?Kimberly Herman is a 56 y.o. female with past medical history significant for diabetes, hypertension, vitamin D deficiency, chronic diastolic heart failure who was admitted to [facility] on 4/13 with left flank pain ,fever, nausea , sepsis protocol. ?A noncontrasted CT abdomen pelvis performed yesterday revealed: ?1. Multiple large stones within the left kidney. Only minimal left ?hydronephrosis and proximal hydroureter. Multiple stacked stones ?within the distal ureter over a 3.1 cm craniocaudad length. ?Additional findings of suspected 4.5 cm mass or complex collection ?at the lower pole of the left kidney with slightly dense moderate ?volume left retroperitoneal fluid with enlargement of left psoas and ?iliacus muscles. Differential considerations include possible left ?lower pole renal abscess with retroperitoneal infection versus ?hemorrhage associated with left inferior pole mass lesion. A ?follow-up contrast enhanced examination is recommended for further ?evaluation if renal function permits. ?2. Enlarged fibroid uterus ?3. Enlarged fatty liver ? ? ?Follow-up contrasted CT abdomen pelvis performed earlier today revealed: ? ? ? ?1. Considerable left perinephric inflammatory change with large 7.6 ?by 6.7 x 4.4 cm multiloculated rim enhancing irregular fluid ?collection within the left perinephric space/left retroperitoneum ?and partially involving the left psoas muscle, consistent with a ?large abscess. ?2. Multiple large left kidney stones as before. Only mild left ?hydronephrosis and proximal hydroureter. Redemonstrated multiple ?stacked stones in the  distal ureter proximal to the left UVJ. ?3. Enlarged fibroid uterus ? ?She is currently afebrile, BP okay, PT 18, INR 1.5, creatinine normal, WBC 12.1, hemoglobin 9.3, platelets normal; she is on meropenem.  Request now received from urology for left percutaneous nephrostomy as well as left perinephric abscess drain placement ? ? ?Past Medical History:  ?Diagnosis Date  ? Diabetes mellitus without complication ([place])   ? Hypertension   ? Menopause 2015  ? Vaginal polyp 10/2019  ? Vitamin D deficiency 08/2019  ?History reviewed. No pertinent surgical history. ? ? ?Allergies: ?Amlodipine, Bactrim [sulfamethoxazole-trimethoprim], and Carvedilol ? ?Medications: ?Prior to Admission medications   ?Medication Sig Start Date End Date Taking? Authorizing Provider  ?atorvastatin (LIPITOR) 40 MG tablet Take 1 tablet (40 mg total) by mouth daily. 10/01/21 03/06/23 Yes Passmore, Jake Church I, NP  ?chlorthalidone (HYGROTON) 25 MG tablet Take 1 tablet (25 mg total) by mouth daily. 12/21/21 06/19/22 Yes Passmore, Jake Church I, NP  ?gabapentin (NEURONTIN) 300 MG capsule Take 1 capsule (300 mg total) by mouth 3 (three) times daily. ?Patient taking differently: Take 300 mg by mouth daily as needed (for pain). 10/01/21  Yes Passmore, Jake Church I, NP  ?insulin detemir (LEVEMIR) 100 UNIT/ML injection Inject 0.1 mLs (10 Units total) into the skin at bedtime. ?Patient taking differently: Inject 10 Units into the skin at bedtime as needed (for blood glucose greater than 240). 10/20/21 03/06/23 Yes Passmore, Jake Church I, NP  ?metFORMIN (GLUCOPHAGE) 1000 MG tablet Take 1 tablet (1,000 mg total) by mouth 2 (two) times daily with a meal. 10/01/21 03/06/23 Yes Passmore, Jake Church I, NP  ?omeprazole (PRILOSEC) 20 MG capsule Take 1 capsule (20 mg total) by mouth daily. ?Patient taking differently: Take 20 mg by mouth daily as needed (for heartburn). 10/19/21  Yes Passmore, Jake Church I, NP  ?ondansetron (ZOFRAN-ODT) 4 MG disintegrating tablet Take 1 tablet (  4 mg total) by  mouth every 8 (eight) hours as needed for nausea. 10/19/21  Yes Passmore, Jake Church I, NP  ?blood glucose meter kit and supplies KIT Dispense based on patient and insurance preference. Use up to four times daily as directed. (FOR ICD-9 250.00, 250.01). 08/29/19   Azzie Glatter, FNP  ?Blood Pressure Monitoring (BLOOD PRESSURE CUFF) MISC Check blood pressure once daily at the same time and record readings 03/07/17   Scot Jun, FNP  ?candesartan (ATACAND) 32 MG tablet Take 1 tablet (32 mg total) by mouth daily. ?Patient not taking: Reported on 03/05/2022 12/21/21 12/21/22  Bo Merino I, NP  ?glucose blood (TRUE METRIX BLOOD GLUCOSE TEST) test strip TEST UP TO FOUR TIMES DAILY 05/11/17   Scot Jun, FNP  ?potassium chloride SA (KLOR-CON M) 20 MEQ tablet Take 1 tablet (20 mEq total) by mouth 2 (two) times daily. ?Patient not taking: Reported on 03/05/2022 10/20/21 12/19/21  Bo Merino I, NP  ?  ? ?Family History  ?Problem Relation Age of Onset  ? Cancer Mother   ? ? ?Social History  ? ?Socioeconomic History  ? Marital status: Single  ?  Spouse name: Not on file  ? Number of children: Not on file  ? Years of education: Not on file  ? Highest education level: Not on file  ?Occupational History  ? Not on file  ?Tobacco Use  ? Smoking status: Never  ? Smokeless tobacco: Never  ?Vaping Use  ? Vaping Use: Never used  ?Substance and Sexual Activity  ? Alcohol use: Not Currently  ? Drug use: No  ? Sexual activity: Yes  ?Other Topics Concern  ? Not on file  ?Social History Narrative  ? Patient recently lost youngest son he was killed in a motor cycle accident at age 63. June 2018  ? ?Social Determinants of Health  ? ?Financial Resource Strain: Not on file  ?Food Insecurity: Not on file  ?Transportation Needs: Not on file  ?Physical Activity: Not on file  ?Stress: Not on file  ?Social Connections: Not on file  ? ? ? ?Review of Systems currently denies fever, headache, chest pain, dyspnea, cough, abdominal  pain, nausea, vomiting or bleeding.  She does have left flank pain. ? ?Vital Signs: ?BP (!) 147/69   Pulse 98   Temp 97.8 ?F (36.6 ?C) (Oral)   Resp 16   Ht _0  (1.702 m)   Wt 186 lb 4.6 oz (84.5 kg)   SpO2 91%   BMI 29.18 kg/m?  ? ?Physical Exam awake, alert.  Chest clear to auscultation bilaterally; heart with regular rate and rhythm.  Abdomen soft, positive bowel sounds, some left lateral/CVA tenderness to palpation.  No significant lower extremity edema ? ?Imaging: ?DG Chest 2 View ? ?Result Date: 03/04/2022 ?CLINICAL DATA:  Left back pain EXAM: CHEST - 2 VIEW COMPARISON:  01/25/2022 FINDINGS: The heart size and mediastinal contours are within normal limits. Both lungs are clear. The visualized skeletal structures are unremarkable. IMPRESSION: No active cardiopulmonary disease. Electronically Signed   By: Donavan Foil M.D.   On: 03/04/2022 20:56  ? ?CT ABDOMEN PELVIS W CONTRAST ? ?Result Date: 03/05/2022 ?CLINICAL DATA:  Low back pain EXAM: CT ABDOMEN AND PELVIS WITH CONTRAST TECHNIQUE: Multidetector CT imaging of the abdomen and pelvis was performed using the standard protocol following bolus administration of intravenous contrast. RADIATION DOSE REDUCTION: This exam was performed according to the departmental dose-optimization program which includes automated exposure control, adjustment of the mA  and/or kV according to patient size and/or use of iterative reconstruction technique. CONTRAST:  172m OMNIPAQUE IOHEXOL 300 MG/ML  SOLN COMPARISON:  CT 03/04/2022 FINDINGS: Lower chest: Lung bases demonstrate no acute consolidation or pleural effusion. Normal cardiac size. Hepatobiliary: Hepatic steatosis. No calcified gallstone or biliary dilatation Pancreas: Unremarkable. No pancreatic ductal dilatation or surrounding inflammatory changes. Spleen: Normal in size without focal abnormality. Adrenals/Urinary Tract: Adrenal glands are normal. Right kidney shows no hydronephrosis. Minimal left hydronephrosis  and proximal hydroureter with more normal caliber distal ureter. Redemonstrated multiple stacked stones in the distal left ureter proximal to the UVJ. Considerable left perinephric fat stranding. Irregular rim enha

## 2022-03-05 NOTE — Assessment & Plan Note (Addendum)
Given potassium supplementation. Resolved. ?

## 2022-03-05 NOTE — Assessment & Plan Note (Addendum)
Secondary to infected stone and kidney abscess. Blood and urine cultures obtained on admission. Empiric antibiotics initiated targeting urinary source. Sepsis physiology improved with initiation of antibiotics. See problem, Perinephric abscess ?

## 2022-03-05 NOTE — Assessment & Plan Note (Signed)
See problem, Perinephric abscess ?

## 2022-03-05 NOTE — ED Provider Notes (Signed)
Blood pressure 135/69, pulse (!) 106, temperature (!) 103.2 ?F (39.6 ?C), temperature source Oral, resp. rate 18, height '5\' 7"'$  (1.702 m), weight 84.5 kg, SpO2 96 %. ? ?Assuming care from Dr. Ronnald Nian.  In short, Kimberly Herman is a 56 y.o. female with a chief complaint of Back Pain ?Kimberly Herman  Refer to the original H&P for additional details. ? ?12:50 AM ?Patient arrived from [facility] as transfer.  Discussed the case with Dr. Milford Cage with Urology. Reviewed the results of the CT scan with contrast showing likely abscess adjacent to the kidney and extending to the psoas.  He advises to keep the patient n.p.o. and continue antibiotics.  Plan will be for medicine admit.  He will consult along with IR for treatment planning/timing. Patient feeling well. No pain currently. Tylenol given PTA with fever. BP WNL.  ? ?Discussed patient's case with [facility], Dr. Nevada Crane to request admission. Patient and family (if present) updated with plan. ? ?I reviewed all nursing notes, vitals, pertinent old records, EKGs, labs, imaging (as available). ? ? ?  ?Margette Fast, MD ?03/05/22 0104 ? ?

## 2022-03-05 NOTE — Assessment & Plan Note (Addendum)
Given magnesium supplementation. Resolved. ?

## 2022-03-05 NOTE — Consult Note (Signed)
Urology Consult   Physician requesting consult: Dr Margo Aye  Reason for consult: Perinephric/psoas abscess and renal and ureteral calculi  History of Present Illness: Kimberly Herman is a 56 y.o. African female who presented to the [facility] emergency room last night with left-sided flank pain and back pain with fever to 103.  Also having some nausea.  No prior history of stones.  During evaluation with noncontrasted CT scan was found to have large left renal calculi with mild hydronephrosis and also a column of distal ureteral stones with 9 mm lead stone fragment and a column of smaller stones behind this at the UVJ heading proximally.  Patient also noted to have what appeared to be an inflammatory process involving the lower pole of the left kidney and the psoas muscle.  Subsequent contrasted scan confirmed large perinephric abscess extending into the psoas muscle.  Patient was transferred to [facility] for further management.  Patient relates history of recent UTI treated with Cipro at urgent care but had recurrent symptoms promptly after the antibiotics finished.  Here now for evaluation management of perinephric and psoas abscess along with large renal calculi and column of left distal ureteral stones.  She denies a history of voiding or storage urinary symptoms, hematuria, UTIs, STDs, urolithiasis, GU malignancy/trauma/surgery.  Past Medical History:  Diagnosis Date   Diabetes mellitus without complication (HCC)    Hypertension    Menopause 2015   Vaginal polyp 10/2019   Vitamin D deficiency 08/2019    History reviewed. No pertinent surgical history.   Current Hospital Medications:  Home meds:  No current facility-administered medications on file prior to encounter.   Current Outpatient Medications on File Prior to Encounter  Medication Sig Dispense Refill   atorvastatin (LIPITOR) 40 MG tablet Take 1 tablet (40 mg total) by mouth daily. 30 tablet 2   chlorthalidone  (HYGROTON) 25 MG tablet Take 1 tablet (25 mg total) by mouth daily. 30 tablet 5   gabapentin (NEURONTIN) 300 MG capsule Take 1 capsule (300 mg total) by mouth 3 (three) times daily. (Patient taking differently: Take 300 mg by mouth daily as needed (for pain).) 90 capsule 2   insulin detemir (LEVEMIR) 100 UNIT/ML injection Inject 0.1 mLs (10 Units total) into the skin at bedtime. (Patient taking differently: Inject 10 Units into the skin at bedtime as needed (for blood glucose greater than 240).) 10 mL 2   metFORMIN (GLUCOPHAGE) 1000 MG tablet Take 1 tablet (1,000 mg total) by mouth 2 (two) times daily with a meal. 60 tablet 2   omeprazole (PRILOSEC) 20 MG capsule Take 1 capsule (20 mg total) by mouth daily. (Patient taking differently: Take 20 mg by mouth daily as needed (for heartburn).) 30 capsule 11   ondansetron (ZOFRAN-ODT) 4 MG disintegrating tablet Take 1 tablet (4 mg total) by mouth every 8 (eight) hours as needed for nausea. 10 tablet 0   blood glucose meter kit and supplies KIT Dispense based on patient and insurance preference. Use up to four times daily as directed. (FOR ICD-9 250.00, 250.01). 1 each 0   Blood Pressure Monitoring (BLOOD PRESSURE CUFF) MISC Check blood pressure once daily at the same time and record readings 1 each 0   candesartan (ATACAND) 32 MG tablet Take 1 tablet (32 mg total) by mouth daily. (Patient not taking: Reported on 03/05/2022) 30 tablet 11   glucose blood (TRUE METRIX BLOOD GLUCOSE TEST) test strip TEST UP TO FOUR TIMES DAILY 100 each 0  potassium chloride SA (KLOR-CON M) 20 MEQ tablet Take 1 tablet (20 mEq total) by mouth 2 (two) times daily. (Patient not taking: Reported on 03/05/2022) 60 tablet 1     Scheduled Meds:  Chlorhexidine Gluconate Cloth  6 each Topical Daily   senna-docusate  1 tablet Oral QHS   Continuous Infusions:  lactated ringers Stopped (03/05/22 0624)   meropenem (MERREM) IV Stopped (03/05/22 0751)   potassium chloride 10 mEq (03/05/22  0757)   PRN Meds:.acetaminophen, HYDROmorphone (DILAUDID) injection, lip balm, ondansetron (ZOFRAN) IV, oxyCODONE, polyethylene glycol  Allergies:  Allergies  Allergen Reactions   Amlodipine Swelling   Bactrim [Sulfamethoxazole-Trimethoprim] Nausea Only    Extreme nausea and felt really bad   Carvedilol Diarrhea    Family History  Problem Relation Age of Onset   Cancer Mother     Social History:  reports that she has never smoked. She has never used smokeless tobacco. She reports that she does not currently use alcohol. She reports that she does not use drugs.  ROS: A complete review of systems was performed.  All systems are negative except for pertinent findings as noted.  Physical Exam:  Vital signs in last 24 hours: Temp:  [97.9 F (36.6 C)-103.2 F (39.6 C)] 98.1 F (36.7 C) (04/14 0241) Pulse Rate:  [78-132] 78 (04/14 0400) Resp:  [16-18] 16 (04/14 0400) BP: (113-152)/(48-85) 113/58 (04/14 0400) SpO2:  [93 %-97 %] 93 % (04/14 0400) Weight:  [84.5 kg] 84.5 kg (04/13 2022) Constitutional:  Alert and oriented, No acute distress Cardiovascular: Regular rate and rhythm, No JVD Respiratory: Normal respiratory effort, Lungs clear bilaterally GI: Abdomen is soft moderate left-sided lower abdominal discomfort without rebound tenderness GU: Left CVA tenderness Lymphatic: No lymphadenopathy Neurologic: Grossly intact, no focal deficits Psychiatric: Normal mood and affect  Laboratory Data:  Recent Labs    03/04/22 2145 03/05/22 0312  WBC 13.4* 12.1*  HGB 10.8* 9.3*  HCT 31.6* 28.7*  PLT 299 238    Recent Labs    03/04/22 2145 03/05/22 0312  NA 134* 139  K 3.5 3.4*  CL 100 110  GLUCOSE 168* 155*  BUN 10 9  CALCIUM 8.7* 8.0*  CREATININE 1.06* 0.94     Results for orders placed or performed during the hospital encounter of 03/04/22 (from the past 24 hour(s))  CBC with Differential     Status: Abnormal   Collection Time: 03/04/22  9:45 PM  Result Value Ref  Range   WBC 13.4 (H) 4.0 - 10.5 K/uL   RBC 3.77 (L) 3.87 - 5.11 MIL/uL   Hemoglobin 10.8 (L) 12.0 - 15.0 g/dL   HCT 16.1 (L) 09.6 - 04.5 %   MCV 83.8 80.0 - 100.0 fL   MCH 28.6 26.0 - 34.0 pg   MCHC 34.2 30.0 - 36.0 g/dL   RDW 40.9 81.1 - 91.4 %   Platelets 299 150 - 400 K/uL   nRBC 0.0 0.0 - 0.2 %   Neutrophils Relative % 81 %   Neutro Abs 10.9 (H) 1.7 - 7.7 K/uL   Lymphocytes Relative 11 %   Lymphs Abs 1.4 0.7 - 4.0 K/uL   Monocytes Relative 8 %   Monocytes Absolute 1.1 (H) 0.1 - 1.0 K/uL   Eosinophils Relative 0 %   Eosinophils Absolute 0.0 0.0 - 0.5 K/uL   Basophils Relative 0 %   Basophils Absolute 0.0 0.0 - 0.1 K/uL   Immature Granulocytes 0 %   Abs Immature Granulocytes 0.04 0.00 - 0.07 K/uL  Comprehensive metabolic panel     Status: Abnormal   Collection Time: 03/04/22  9:45 PM  Result Value Ref Range   Sodium 134 (L) 135 - 145 mmol/L   Potassium 3.5 3.5 - 5.1 mmol/L   Chloride 100 98 - 111 mmol/L   CO2 24 22 - 32 mmol/L   Glucose, Bld 168 (H) 70 - 99 mg/dL   BUN 10 6 - 20 mg/dL   Creatinine, Ser 4.09 (H) 0.44 - 1.00 mg/dL   Calcium 8.7 (L) 8.9 - 10.3 mg/dL   Total Protein 8.0 6.5 - 8.1 g/dL   Albumin 3.3 (L) 3.5 - 5.0 g/dL   AST 20 15 - 41 U/L   ALT 19 0 - 44 U/L   Alkaline Phosphatase 71 38 - 126 U/L   Total Bilirubin 1.0 0.3 - 1.2 mg/dL   GFR, Estimated >81 >19 mL/min   Anion gap 10 5 - 15  Lipase, blood     Status: None   Collection Time: 03/04/22  9:45 PM  Result Value Ref Range   Lipase 24 11 - 51 U/L  Urinalysis, Routine w reflex microscopic Urine, Clean Catch     Status: Abnormal   Collection Time: 03/04/22 11:05 PM  Result Value Ref Range   Color, Urine YELLOW YELLOW   APPearance CLOUDY (A) CLEAR   Specific Gravity, Urine 1.010 1.005 - 1.030   pH 6.0 5.0 - 8.0   Glucose, UA NEGATIVE NEGATIVE mg/dL   Hgb urine dipstick TRACE (A) NEGATIVE   Bilirubin Urine NEGATIVE NEGATIVE   Ketones, ur NEGATIVE NEGATIVE mg/dL   Protein, ur 147 (A) NEGATIVE  mg/dL   Nitrite POSITIVE (A) NEGATIVE   Leukocytes,Ua SMALL (A) NEGATIVE  Pregnancy, urine     Status: None   Collection Time: 03/04/22 11:05 PM  Result Value Ref Range   Preg Test, Ur NEGATIVE NEGATIVE  Urinalysis, Microscopic (reflex)     Status: Abnormal   Collection Time: 03/04/22 11:05 PM  Result Value Ref Range   RBC / HPF 0-5 0 - 5 RBC/hpf   WBC, UA 21-50 0 - 5 WBC/hpf   Bacteria, UA MANY (A) NONE SEEN   Squamous Epithelial / LPF 0-5 0 - 5   WBC Clumps PRESENT   Lactic acid, plasma     Status: None   Collection Time: 03/04/22 11:14 PM  Result Value Ref Range   Lactic Acid, Venous 1.1 0.5 - 1.9 mmol/L  MRSA Next Gen by PCR, Nasal     Status: None   Collection Time: 03/05/22  2:28 AM   Specimen: Nasal Mucosa; Nasal Swab  Result Value Ref Range   MRSA by PCR Next Gen NOT DETECTED NOT DETECTED  Lactic acid, plasma     Status: None   Collection Time: 03/05/22  3:12 AM  Result Value Ref Range   Lactic Acid, Venous 0.9 0.5 - 1.9 mmol/L  CBC with Differential/Platelet     Status: Abnormal   Collection Time: 03/05/22  3:12 AM  Result Value Ref Range   WBC 12.1 (H) 4.0 - 10.5 K/uL   RBC 3.22 (L) 3.87 - 5.11 MIL/uL   Hemoglobin 9.3 (L) 12.0 - 15.0 g/dL   HCT 82.9 (L) 56.2 - 13.0 %   MCV 89.1 80.0 - 100.0 fL   MCH 28.9 26.0 - 34.0 pg   MCHC 32.4 30.0 - 36.0 g/dL   RDW 86.5 (H) 78.4 - 69.6 %   Platelets 238 150 - 400 K/uL   nRBC 0.0 0.0 -  0.2 %   Neutrophils Relative % 77 %   Neutro Abs 9.3 (H) 1.7 - 7.7 K/uL   Lymphocytes Relative 14 %   Lymphs Abs 1.6 0.7 - 4.0 K/uL   Monocytes Relative 9 %   Monocytes Absolute 1.0 0.1 - 1.0 K/uL   Eosinophils Relative 0 %   Eosinophils Absolute 0.0 0.0 - 0.5 K/uL   Basophils Relative 0 %   Basophils Absolute 0.1 0.0 - 0.1 K/uL   Immature Granulocytes 0 %   Abs Immature Granulocytes 0.05 0.00 - 0.07 K/uL  Comprehensive metabolic panel     Status: Abnormal   Collection Time: 03/05/22  3:12 AM  Result Value Ref Range   Sodium 139 135  - 145 mmol/L   Potassium 3.4 (L) 3.5 - 5.1 mmol/L   Chloride 110 98 - 111 mmol/L   CO2 22 22 - 32 mmol/L   Glucose, Bld 155 (H) 70 - 99 mg/dL   BUN 9 6 - 20 mg/dL   Creatinine, Ser 7.51 0.44 - 1.00 mg/dL   Calcium 8.0 (L) 8.9 - 10.3 mg/dL   Total Protein 6.7 6.5 - 8.1 g/dL   Albumin 2.9 (L) 3.5 - 5.0 g/dL   AST 14 (L) 15 - 41 U/L   ALT 16 0 - 44 U/L   Alkaline Phosphatase 57 38 - 126 U/L   Total Bilirubin 1.3 (H) 0.3 - 1.2 mg/dL   GFR, Estimated >02 >58 mL/min   Anion gap 7 5 - 15  Magnesium     Status: Abnormal   Collection Time: 03/05/22  3:12 AM  Result Value Ref Range   Magnesium 1.5 (L) 1.7 - 2.4 mg/dL  Phosphorus     Status: None   Collection Time: 03/05/22  3:12 AM  Result Value Ref Range   Phosphorus 3.5 2.5 - 4.6 mg/dL  Protime-INR     Status: Abnormal   Collection Time: 03/05/22  6:30 AM  Result Value Ref Range   Prothrombin Time 18.0 (H) 11.4 - 15.2 seconds   INR 1.5 (H) 0.8 - 1.2   Recent Results (from the past 240 hour(s))  MRSA Next Gen by PCR, Nasal     Status: None   Collection Time: 03/05/22  2:28 AM   Specimen: Nasal Mucosa; Nasal Swab  Result Value Ref Range Status   MRSA by PCR Next Gen NOT DETECTED NOT DETECTED Final    Comment: (NOTE) The GeneXpert MRSA Assay (FDA approved for NASAL specimens only), is one component of a comprehensive MRSA colonization surveillance program. It is not intended to diagnose MRSA infection nor to guide or monitor treatment for MRSA infections. Test performance is not FDA approved in patients less than 70 years old. Performed at [facility], [address] [place], [place] [address]     Renal Function: Recent Labs    03/04/22 2145 03/05/22 0312  CREATININE 1.06* 0.94   Estimated Creatinine Clearance: 73.8 mL/min (by C-G formula based on SCr of 0.94 mg/dL).  Radiologic Imaging: DG Chest 2 View  Result Date: 03/04/2022 CLINICAL DATA:  Left back pain EXAM: CHEST - 2 VIEW COMPARISON:   01/25/2022 FINDINGS: The heart size and mediastinal contours are within normal limits. Both lungs are clear. The visualized skeletal structures are unremarkable. IMPRESSION: No active cardiopulmonary disease. Electronically Signed   By: Jasmine Pang M.D.   On: 03/04/2022 20:56   CT ABDOMEN PELVIS W CONTRAST  Result Date: 03/05/2022 CLINICAL DATA:  Low back pain EXAM: CT ABDOMEN AND  PELVIS WITH CONTRAST TECHNIQUE: Multidetector CT imaging of the abdomen and pelvis was performed using the standard protocol following bolus administration of intravenous contrast. RADIATION DOSE REDUCTION: This exam was performed according to the departmental dose-optimization program which includes automated exposure control, adjustment of the mA and/or kV according to patient size and/or use of iterative reconstruction technique. CONTRAST:  OMNIPAQUE IOHEXOL 300 MG/ML  SOLN COMPARISON:  CT 03/04/2022 FINDINGS: Lower chest: Lung bases demonstrate no acute consolidation or pleural effusion. Normal cardiac size. Hepatobiliary: Hepatic steatosis. No calcified gallstone or biliary dilatation Pancreas: Unremarkable. No pancreatic ductal dilatation or surrounding inflammatory changes. Spleen: Normal in size without focal abnormality. Adrenals/Urinary Tract: Adrenal glands are normal. Right kidney shows no hydronephrosis. Minimal left hydronephrosis and proximal hydroureter with more normal caliber distal ureter. Redemonstrated multiple stacked stones in the distal left ureter proximal to the UVJ. Considerable left perinephric fat stranding. Irregular rim enhancing fluid collection within the left perinephric space with mass effect on the lower pole of the left kidney. This is contiguous with multi loculated rim enhancing collection within the left retroperitoneum involving the psoas muscle. Collection measures approximately 7.6 cm craniocaudad by 6.7 cm AP by 4.4 cm transverse. Edema and asymmetric thickening of the left iliacus  muscle without definitive enhancing fluid collection. Bladder is unremarkable. Some excretion of contrast from left kidney on delayed views. Stomach/Bowel: The stomach is nonenlarged. No dilated small bowel. No acute bowel wall thickening. Vascular/Lymphatic: Nonaneurysmal aorta. Mild retroperitoneal adenopathy. Reproductive: Enlarged lobulated uterus with multiple masses consistent with fibroids. No adnexal mass Other: Negative for pelvic effusion or free air Musculoskeletal: No acute osseous abnormality. Enlarged left psoas and iliacus muscles consistent with edema. Left psoas muscle appears involved by the multiloculated abscess in the retroperitoneum. IMPRESSION: 1. Considerable left perinephric inflammatory change with large 7.6 by 6.7 x 4.4 cm multiloculated rim enhancing irregular fluid collection within the left perinephric space/left retroperitoneum and partially involving the left psoas muscle, consistent with a large abscess. 2. Multiple large left kidney stones as before. Only mild left hydronephrosis and proximal hydroureter. Redemonstrated multiple stacked stones in the distal ureter proximal to the left UVJ. 3. Enlarged fibroid uterus Electronically Signed   By: Jasmine Pang M.D.   On: 03/05/2022 00:02   CT Renal Stone Study  Result Date: 03/04/2022 CLINICAL DATA:  Left flank pain EXAM: CT ABDOMEN AND PELVIS WITHOUT CONTRAST TECHNIQUE: Multidetector CT imaging of the abdomen and pelvis was performed following the standard protocol without IV contrast. RADIATION DOSE REDUCTION: This exam was performed according to the departmental dose-optimization program which includes automated exposure control, adjustment of the mA and/or kV according to patient size and/or use of iterative reconstruction technique. COMPARISON:  None. FINDINGS: Lower chest: Lung bases demonstrate no acute consolidation or effusion. Normal cardiac size. Hepatobiliary: Liver is enlarged measuring 21 cm craniocaudad. Diffuse  hypodensity consistent with steatosis. No calcified gallstone or biliary dilatation Pancreas: Unremarkable. No pancreatic ductal dilatation or surrounding inflammatory changes. Spleen: Normal in size without focal abnormality. Adrenals/Urinary Tract: Adrenal glands are normal. The right kidney shows no hydronephrosis. Large lower pole stones on the left measuring to 28 mm. There may be minimal hydronephrosis. There are multiple stacked stones in the distal ureter proximal to the UVJ, stones extend over a 3.1 cm craniocaudad length of ureter with the largest stone seen distally and measuring 9 mm. Exophytic mass off the lower pole left kidney measuring 4.5 by 3 cm, series 2, image 34. Stomach/Bowel: The stomach is nonenlarged. No dilated small  bowel. No acute bowel wall thickening. Vascular/Lymphatic: Mild atherosclerosis. No aneurysm. Mild left retroperitoneal nodes up to 12 mm. Reproductive: Enlarged lobulated uterus with multiple masses consistent with fibroids. No adnexal mass Other: Negative for pelvic effusion or free air. Asymmetric enlargement of the left psoas muscle and iliacus muscle with stranding. Ill-defined slightly dense left retroperitoneal fluid. Musculoskeletal: No acute osseous abnormality IMPRESSION: 1. Multiple large stones within the left kidney. Only minimal left hydronephrosis and proximal hydroureter. Multiple stacked stones within the distal ureter over a 3.1 cm craniocaudad length. Additional findings of suspected 4.5 cm mass or complex collection at the lower pole of the left kidney with slightly dense moderate volume left retroperitoneal fluid with enlargement of left psoas and iliacus muscles. Differential considerations include possible left lower pole renal abscess with retroperitoneal infection versus hemorrhage associated with left inferior pole mass lesion. A follow-up contrast enhanced examination is recommended for further evaluation if renal function permits. 2. Enlarged fibroid  uterus 3. Enlarged fatty liver Electronically Signed   By: Jasmine Pang M.D.   On: 03/04/2022 22:03    I independently reviewed the above imaging studies.  Impression/Recommendation: 1.  Left perinephric abscess/psoas abscess-plan for IR intervention today with percutaneous drainage of psoas and perinephric abscess.  Also will attempt placement of left nephrostomy tube through separate tract to facilitate drainage of the collecting system as well as providing access to likely upcoming needed percutaneous nephrolithotomy for large renal calculi. 2.  Large left renal calculi-left nephrostomy tube to drain upper tract will use this tract to facilitate ultimate left PCNL 3.  Left distal ureteral calculi-Will ultimately need left ureteroscopy and laser lithotripsy once medically stable.  If unable to place nephrostomy tube may need attempt at cystoscopy and insertion left JJ stent. We will follow during hospitalization Belva Agee 03/05/2022, 8:02 AM     CC:

## 2022-03-05 NOTE — Hospital Course (Addendum)
Kimberly Herman is a 56 y.o. female with a history of hypertension, hyperlipidemia, diabetes, chronic diastolic heart failure. Patient presented secondary to left flank pain and found to have a large left perinephric/left psoas abscess with associated infected stone, meeting sepsis criteria. Meropenem initiated. IR and urology consulted. Left percutaneous drain placed for drainage of abscess. Urine and wound cultures significant for Citrobacter Koseri. Antibiotics switch to Cefazolin IV and finally to cefadroxil on discharge. Infectious disease recommending a 14 day course starting on day of discharge, in addition to outpatient follow-up. Patient to follow-up with IR and urology as an outpatient. ?

## 2022-03-05 NOTE — Assessment & Plan Note (Addendum)
Hemoglobin of 10.8 on admission. Downward drift with stabilization. ?

## 2022-03-05 NOTE — Assessment & Plan Note (Addendum)
Well controlled. Hemoglobin A1C of 6.7% from January 2023. Patient is on metformin as an outpatient. Continue on discharge. ?

## 2022-03-05 NOTE — Assessment & Plan Note (Addendum)
Associated mild hydronephrosis. Multiple stones. Large. Urology consulted with recommendation for nephrostomy tube. Unfortunately, no clear window was identified by IR, so nephrostomy tube was deferred. Patient to follow-up with Urology as an outpatient. ?

## 2022-03-06 DIAGNOSIS — N151 Renal and perinephric abscess: Secondary | ICD-10-CM | POA: Diagnosis not present

## 2022-03-06 DIAGNOSIS — I10 Essential (primary) hypertension: Secondary | ICD-10-CM | POA: Diagnosis not present

## 2022-03-06 DIAGNOSIS — E782 Mixed hyperlipidemia: Secondary | ICD-10-CM

## 2022-03-06 DIAGNOSIS — N201 Calculus of ureter: Secondary | ICD-10-CM

## 2022-03-06 DIAGNOSIS — K6812 Psoas muscle abscess: Secondary | ICD-10-CM

## 2022-03-06 DIAGNOSIS — A419 Sepsis, unspecified organism: Secondary | ICD-10-CM | POA: Diagnosis not present

## 2022-03-06 DIAGNOSIS — E119 Type 2 diabetes mellitus without complications: Secondary | ICD-10-CM

## 2022-03-06 LAB — CBC
HCT: 26.8 % — ABNORMAL LOW (ref 36.0–46.0)
Hemoglobin: 8.9 g/dL — ABNORMAL LOW (ref 12.0–15.0)
MCH: 28.8 pg (ref 26.0–34.0)
MCHC: 33.2 g/dL (ref 30.0–36.0)
MCV: 86.7 fL (ref 80.0–100.0)
Platelets: 249 10*3/uL (ref 150–400)
RBC: 3.09 MIL/uL — ABNORMAL LOW (ref 3.87–5.11)
RDW: 15.4 % (ref 11.5–15.5)
WBC: 8.8 10*3/uL (ref 4.0–10.5)
nRBC: 0 % (ref 0.0–0.2)

## 2022-03-06 LAB — COMPREHENSIVE METABOLIC PANEL
ALT: 18 U/L (ref 0–44)
AST: 20 U/L (ref 15–41)
Albumin: 2.6 g/dL — ABNORMAL LOW (ref 3.5–5.0)
Alkaline Phosphatase: 57 U/L (ref 38–126)
Anion gap: 5 (ref 5–15)
BUN: 9 mg/dL (ref 6–20)
CO2: 29 mmol/L (ref 22–32)
Calcium: 8.4 mg/dL — ABNORMAL LOW (ref 8.9–10.3)
Chloride: 105 mmol/L (ref 98–111)
Creatinine, Ser: 0.92 mg/dL (ref 0.44–1.00)
GFR, Estimated: >60 mL/min (ref >60)
Glucose, Bld: 146 mg/dL — ABNORMAL HIGH (ref 70–99)
Potassium: 3.7 mmol/L (ref 3.5–5.1)
Sodium: 139 mmol/L (ref 135–145)
Total Bilirubin: 0.8 mg/dL (ref 0.3–1.2)
Total Protein: 6.2 g/dL — ABNORMAL LOW (ref 6.5–8.1)

## 2022-03-06 LAB — MAGNESIUM: Magnesium: 2.1 mg/dL (ref 1.7–2.4)

## 2022-03-06 MED ORDER — PNEUMOCOCCAL 20-VAL CONJ VACC 0.5 ML IM SUSY
0.5000 mL | PREFILLED_SYRINGE | INTRAMUSCULAR | Status: DC
Start: 2022-03-07 — End: 2022-03-07
  Filled 2022-03-06: qty 0.5

## 2022-03-06 MED ORDER — INSULIN ASPART 100 UNIT/ML IJ SOLN
0.0000 [IU] | Freq: Three times a day (TID) | INTRAMUSCULAR | Status: DC
  Administered 2022-03-06 – 2022-03-08 (×4): 3 [IU] via SUBCUTANEOUS
  Administered 2022-03-08: 5 [IU] via SUBCUTANEOUS
  Administered 2022-03-09: 2 [IU] via SUBCUTANEOUS
  Administered 2022-03-09: 3 [IU] via SUBCUTANEOUS

## 2022-03-06 NOTE — Progress Notes (Signed)
? ?PROGRESS NOTE ? ? ? ?Kimberly Herman  QAS:[id number] DOB: 04-25-1966 DOA: 03/04/2022 ?PCP: Bo Merino I, NP ? ? ?Brief Narrative: ?Kimberly Herman is a 56 y.o. female with a history of hypertension, hyperlipidemia, diabetes, chronic diastolic heart failure. Patient presented secondary to left flank pain and found to have a large left perinephric/left psoas abscess with associated infected stone, meeting sepsis criteria. Meropenem initiated. IR and urology consulted. ? ? ?Assessment and Plan: ?* Sepsis ([place]) ?Secondary to infected stone and kidney abscess. Blood and urine cultures obtained on admission. Empiric antibiotics initiated targeting urinary source. Sepsis physiology improved with initiation of antibiotics. ?-See problem, Perinephric abscess ? ?Ureteral calculus, left ?Associated mild hydronephrosis. Multiple stones. Large. Urology consulted with recommendation for nephrostomy tube. Unfortunately, no clear window was identified by IR, so nephrostomy tube was deferred. ?-Urology recommendations: holding off on consideration for nephrostomy tube/stent at this time ? ?Psoas abscess, left ([place]) ?See problem, Perinephric abscess ? ?Perinephric abscess ?Large abscess measuring 7.6 x 6.7 x 4.4 cm. IR consulted for percutaneous drain placement. Urine/blood cultures obtained. Empiric meropenem initiated on admission. Percutaneous drain placed by IR on 4/14. Wound culture with abundant GNR. Urine culture significant for Citrobacter Koseri. Switched from meropenem to Ceftriaxone. ?-Continue Ceftriaxone ?-Follow-up wound, blood and urine cultures ?-Follow-up IR recommendations/management for drain ?-Oxycodone PRN for pain ? ?ANEMIA, IRON DEFICIENCY, UNSPEC. ?Hemoglobin of 10.8 on admission. Downward drift. ?-CBC in AM ? ?Hyperlipidemia ?Patient is on atorvastatin as an outpatient. ?-Continue home atorvastatin ? ?HTN (hypertension) ?Patient is on chlorthalidone as an outpatient. Previously prescribed candesartan but  listed as not taking. Blood pressure was soft on admission and chlorthalidone was held. ?-Watch BP ? ?T2DM (type 2 diabetes mellitus) ([place]) ?Well controlled. Hemoglobin A1C of 6.7% from January 2023. Patient is on metformin as an outpatient. ?-SSI ? ?Hypomagnesemia-resolved as of 03/06/2022 ?Given magnesium supplementation. Resolved. ? ?Hypokalemia-resolved as of 03/06/2022 ?Given potassium supplementation. Resolved. ? ? ? ?DVT prophylaxis: SCDs ?Code Status:   Code Status: Full Code ?Family Communication: Husband at bedside ?Disposition Plan: Discharge home likely in 2-4 days pending IR/Urology recommendations, culture data, transition to oral antibiotics ? ? ?Consultants:  ?Urology ?Interventional radiology ? ?Procedures:  ?CT guided left perinephric drain placement (4/14) ? ?Antimicrobials: ?Meropenem  ?Ceftriaxone ? ? ?Subjective: ?Pain is improving. ? ?Objective: ?BP 133/69 (BP Location: Right Arm)   Herman 73   Temp 98.7 ?F (37.1 ?C) (Oral)   Resp 18   Ht '5\' 7"'$  (1.702 m)   Wt 84.5 kg   SpO2 99%   BMI 29.18 kg/m?  ? ?Examination: ? ?General exam: Appears calm and comfortable ?Respiratory system: Clear to auscultation. Respiratory effort normal. ?Cardiovascular system: S1 & S2 heard, RRR. ?Gastrointestinal system: Abdomen is nondistended, soft and non-tender. Some tenderness of left flank. No organomegaly or masses felt. Normal bowel sounds heard. ?Central nervous system: Alert and oriented. No focal neurological deficits. ?Musculoskeletal: No edema. No calf tenderness ?Skin: No cyanosis. No rashes ?Psychiatry: Judgement and insight appear normal. Mood & affect appropriate.   ? ? ?Data Reviewed: I have personally reviewed following labs and imaging studies ? ?CBC ?Lab Results  ?Component Value Date  ? WBC 8.8 03/06/2022  ? RBC 3.09 (L) 03/06/2022  ? HGB 8.9 (L) 03/06/2022  ? HCT 26.8 (L) 03/06/2022  ? MCV 86.7 03/06/2022  ? MCH 28.8 03/06/2022  ? PLT 249 03/06/2022  ? MCHC 33.2 03/06/2022  ? RDW 15.4  03/06/2022  ? LYMPHSABS 1.6 03/05/2022  ? MONOABS 1.0 03/05/2022  ?  EOSABS 0.0 03/05/2022  ? BASOSABS 0.1 03/05/2022  ? ? ? ?Last metabolic panel ?Lab Results  ?Component Value Date  ? NA 139 03/06/2022  ? K 3.7 03/06/2022  ? CL 105 03/06/2022  ? CO2 29 03/06/2022  ? BUN 9 03/06/2022  ? CREATININE 0.92 03/06/2022  ? GLUCOSE 146 (H) 03/06/2022  ? GFRNONAA >60 03/06/2022  ? GFRAA 64 01/02/2021  ? CALCIUM 8.4 (L) 03/06/2022  ? PHOS 3.5 03/05/2022  ? PROT 6.2 (L) 03/06/2022  ? ALBUMIN 2.6 (L) 03/06/2022  ? LABGLOB 3.9 10/19/2021  ? AGRATIO 1.1 (L) 10/19/2021  ? BILITOT 0.8 03/06/2022  ? ALKPHOS 57 03/06/2022  ? AST 20 03/06/2022  ? ALT 18 03/06/2022  ? ANIONGAP 5 03/06/2022  ? ? ?GFR: ?Estimated Creatinine Clearance: 75.4 mL/min (by C-G formula based on SCr of 0.92 mg/dL). ? ?Recent Results (from the past 240 hour(s))  ?Urine Culture     Status: Abnormal (Preliminary result)  ? Collection Time: 03/04/22 11:05 PM  ? Specimen: Urine, Clean Catch  ?Result Value Ref Range Status  ? Specimen Description   Final  ?  URINE, CLEAN CATCH ?Performed at [facility], [address] [place], [place] [address] ?  ? Special Requests   Final  ?  NONE ?Performed at [facility], [address], [place], [place] [address] ?  ? Culture >=100,000 COLONIES/mL CITROBACTER KOSERI (A)  Final  ? Report Status PENDING  Incomplete  ?Blood culture (routine x 2)     Status: None (Preliminary result)  ? Collection Time: 03/04/22 11:14 PM  ? Specimen: BLOOD  ?Result Value Ref Range Status  ? Specimen Description   Final  ?  BLOOD RIGHT ANTECUBITAL ?Performed at [facility], [address], [place], [place] [address] ?  ? Special Requests   Final  ?  BOTTLES DRAWN AEROBIC AND ANAEROBIC Blood Culture adequate volume ?Performed at [facility], [address] [place], [place] [address] ?  ? Culture   Final  ?  NO GROWTH 1 DAY ?Performed at [facility], [place] [address], [place], [place] [address] ?  ?  Report Status PENDING  Incomplete  ?Blood culture (routine x 2)     Status: None (Preliminary result)  ? Collection Time: 03/04/22 11:20 PM  ? Specimen: BLOOD  ?Result Value Ref Range Status  ? Specimen Description   Final  ?  BLOOD SITE NOT SPECIFIED ?Performed at [facility], [address], [place], [place] [address] ?  ? Special Requests   Final  ?  BOTTLES DRAWN AEROBIC AND ANAEROBIC Blood Culture adequate volume ?Performed at [facility], [address] [place], [place] [address] ?  ? Culture   Final  ?  NO GROWTH 1 DAY ?Performed at [facility], [place] [address] [place], [place] [address] ?  ? Report Status PENDING  Incomplete  ?MRSA Next Gen by PCR, Nasal     Status: None  ? Collection Time: 03/05/22  2:28 AM  ? Specimen: Nasal Mucosa; Nasal Swab  ?Result Value Ref Range Status  ? MRSA by PCR Next Gen NOT DETECTED NOT DETECTED Final  ?  Comment: (NOTE) ?The GeneXpert MRSA Assay (FDA approved for NASAL specimens only), ?is one component of a comprehensive MRSA colonization surveillance ?program. It is not intended to diagnose MRSA infection nor to guide ?or monitor treatment for MRSA infections. ?Test performance is  not FDA approved in patients less than 2 years ?old. ?Performed at [facility], [address] ?[place], [place] [address] ?  ?Aerobic/Anaerobic Culture w Gram Stain (surgical/deep wound)     Status: None (Preliminary result)  ? Collection Time: 03/05/22 12:48 PM  ? Specimen: Abscess  ?Result Value Ref Range Status  ? Specimen Description   Final  ?  ABSCESS ?Performed at [facility], [place] [address] [place], [place] [address] ?  ? Special Requests   Final  ?  ABDOMEN ?Performed at [facility], [place] [address] [place], [place] [address] ?  ? Gram Stain   Final  ?  ABUNDANT WBC PRESENT, PREDOMINANTLY PMN ?ABUNDANT GRAM NEGATIVE RODS ?  ? Culture   Final  ?  CULTURE REINCUBATED FOR BETTER GROWTH ?Performed at  [facility], [place] [address] [place], [place] [address] ?  ? Report Status PENDING  Incomplete  ?  ? ? ?Radiology Studies: ?DG Chest 2 View ? ?Result Date: 03/04/2022 ?CLINICAL DATA:  Left back pain EXAM: [place]

## 2022-03-06 NOTE — Progress Notes (Signed)
Urology Progress Note  ? ?Subjective: ?NAEON.  ?No s/p left percutaneous drain placement. VIR unable to place PCN. ?Left sided flank soreness related to percutaneous drain placement ? ?Objective: ?Vital signs in last 24 hours: ?Temp:  [98.2 ?F (36.8 ?C)-101.1 ?F (38.4 ?C)] 98.2 ?F (36.8 ?C) (04/15 6440) ?Pulse Rate:  [71-106] 71 (04/15 0513) ?Resp:  [16-21] 20 (04/15 0513) ?BP: (113-185)/(53-89) 113/67 (04/15 0513) ?SpO2:  [95 %-99 %] 98 % (04/15 0513) ? ?Intake/Output from previous day: ?04/14 0701 - 04/15 0700 ?In: 1259.3 [I.V.:988.6; IV Piggyback:270.7] ?Out: 1240 [Urine:1050; Drains:190] ?Intake/Output this shift: ?No intake/output data recorded. ? ?Physical Exam:  ?General: Alert and oriented ?CV: Regular rate ?Lungs: No increased work of breathing ?Abdomen: Soft ?GU: Left percutaneous drain draining reddish brown purulent fluid ?Ext: NT, No erythema ? ?Lab Results: ?Recent Labs  ?  03/04/22 ?2145 03/05/22 ?3474 03/06/22 ?2595  ?HGB 10.8* 9.3* 8.9*  ?HCT 31.6* 28.7* 26.8*  ? ?Recent Labs  ?  03/05/22 ?0312 03/06/22 ?6387  ?NA 139 139  ?K 3.4* 3.7  ?CL 110 105  ?CO2 22 29  ?GLUCOSE 155* 146*  ?BUN 9 9  ?CREATININE 0.94 0.92  ?CALCIUM 8.0* 8.4*  ? ? ?Studies/Results: ?DG Chest 2 View ? ?Result Date: 03/04/2022 ?CLINICAL DATA:  Left back pain EXAM: CHEST - 2 VIEW COMPARISON:  01/25/2022 FINDINGS: The heart size and mediastinal contours are within normal limits. Both lungs are clear. The visualized skeletal structures are unremarkable. IMPRESSION: No active cardiopulmonary disease. Electronically Signed   By: Donavan Foil M.D.   On: 03/04/2022 20:56  ? ?CT ABDOMEN PELVIS W CONTRAST ? ?Result Date: 03/05/2022 ?CLINICAL DATA:  Low back pain EXAM: CT ABDOMEN AND PELVIS WITH CONTRAST TECHNIQUE: Multidetector CT imaging of the abdomen and pelvis was performed using the standard protocol following bolus administration of intravenous contrast. RADIATION DOSE REDUCTION: This exam was performed according to the departmental  dose-optimization program which includes automated exposure control, adjustment of the mA and/or kV according to patient size and/or use of iterative reconstruction technique. CONTRAST:  145m OMNIPAQUE IOHEXOL 300 MG/ML  SOLN COMPARISON:  CT 03/04/2022 FINDINGS: Lower chest: Lung bases demonstrate no acute consolidation or pleural effusion. Normal cardiac size. Hepatobiliary: Hepatic steatosis. No calcified gallstone or biliary dilatation Pancreas: Unremarkable. No pancreatic ductal dilatation or surrounding inflammatory changes. Spleen: Normal in size without focal abnormality. Adrenals/Urinary Tract: Adrenal glands are normal. Right kidney shows no hydronephrosis. Minimal left hydronephrosis and proximal hydroureter with more normal caliber distal ureter. Redemonstrated multiple stacked stones in the distal left ureter proximal to the UVJ. Considerable left perinephric fat stranding. Irregular rim enhancing fluid collection within the left perinephric space with mass effect on the lower pole of the left kidney. This is contiguous with multi loculated rim enhancing collection within the left retroperitoneum involving the psoas muscle. Collection measures approximately 7.6 cm craniocaudad by 6.7 cm AP by 4.4 cm transverse. Edema and asymmetric thickening of the left iliacus muscle without definitive enhancing fluid collection. Bladder is unremarkable. Some excretion of contrast from left kidney on delayed views. Stomach/Bowel: The stomach is nonenlarged. No dilated small bowel. No acute bowel wall thickening. Vascular/Lymphatic: Nonaneurysmal aorta. Mild retroperitoneal adenopathy. Reproductive: Enlarged lobulated uterus with multiple masses consistent with fibroids. No adnexal mass Other: Negative for pelvic effusion or free air Musculoskeletal: No acute osseous abnormality. Enlarged left psoas and iliacus muscles consistent with edema. Left psoas muscle appears involved by the multiloculated abscess in the  retroperitoneum. IMPRESSION: 1. Considerable left perinephric inflammatory change with large  7.6 by 6.7 x 4.4 cm multiloculated rim enhancing irregular fluid collection within the left perinephric space/left retroperitoneum and partially involving the left psoas muscle, consistent with a large abscess. 2. Multiple large left kidney stones as before. Only mild left hydronephrosis and proximal hydroureter. Redemonstrated multiple stacked stones in the distal ureter proximal to the left UVJ. 3. Enlarged fibroid uterus Electronically Signed   By: Donavan Foil M.D.   On: 03/05/2022 00:02  ? ?CT Renal Stone Study ? ?Result Date: 03/04/2022 ?CLINICAL DATA:  Left flank pain EXAM: CT ABDOMEN AND PELVIS WITHOUT CONTRAST TECHNIQUE: Multidetector CT imaging of the abdomen and pelvis was performed following the standard protocol without IV contrast. RADIATION DOSE REDUCTION: This exam was performed according to the departmental dose-optimization program which includes automated exposure control, adjustment of the mA and/or kV according to patient size and/or use of iterative reconstruction technique. COMPARISON:  None. FINDINGS: Lower chest: Lung bases demonstrate no acute consolidation or effusion. Normal cardiac size. Hepatobiliary: Liver is enlarged measuring 21 cm craniocaudad. Diffuse hypodensity consistent with steatosis. No calcified gallstone or biliary dilatation Pancreas: Unremarkable. No pancreatic ductal dilatation or surrounding inflammatory changes. Spleen: Normal in size without focal abnormality. Adrenals/Urinary Tract: Adrenal glands are normal. The right kidney shows no hydronephrosis. Large lower pole stones on the left measuring to 28 mm. There may be minimal hydronephrosis. There are multiple stacked stones in the distal ureter proximal to the UVJ, stones extend over a 3.1 cm craniocaudad length of ureter with the largest stone seen distally and measuring 9 mm. Exophytic mass off the lower pole left kidney  measuring 4.5 by 3 cm, series 2, image 34. Stomach/Bowel: The stomach is nonenlarged. No dilated small bowel. No acute bowel wall thickening. Vascular/Lymphatic: Mild atherosclerosis. No aneurysm. Mild left retroperitoneal nodes up to 12 mm. Reproductive: Enlarged lobulated uterus with multiple masses consistent with fibroids. No adnexal mass Other: Negative for pelvic effusion or free air. Asymmetric enlargement of the left psoas muscle and iliacus muscle with stranding. Ill-defined slightly dense left retroperitoneal fluid. Musculoskeletal: No acute osseous abnormality IMPRESSION: 1. Multiple large stones within the left kidney. Only minimal left hydronephrosis and proximal hydroureter. Multiple stacked stones within the distal ureter over a 3.1 cm craniocaudad length. Additional findings of suspected 4.5 cm mass or complex collection at the lower pole of the left kidney with slightly dense moderate volume left retroperitoneal fluid with enlargement of left psoas and iliacus muscles. Differential considerations include possible left lower pole renal abscess with retroperitoneal infection versus hemorrhage associated with left inferior pole mass lesion. A follow-up contrast enhanced examination is recommended for further evaluation if renal function permits. 2. Enlarged fibroid uterus 3. Enlarged fatty liver Electronically Signed   By: Donavan Foil M.D.   On: 03/04/2022 22:03  ? ?CT IMAGE GUIDED DRAINAGE BY PERCUTANEOUS CATHETER ? ?Result Date: 03/05/2022 ?INDICATION: 56 year old female with history of left nephrolithiasis presenting with perinephric abscess. EXAM: CT IMAGE GUIDED DRAINAGE BY PERCUTANEOUS CATHETER COMPARISON:  CT abdomen pelvis from 03/04/2022 MEDICATIONS: The patient is currently admitted to the hospital and receiving intravenous antibiotics. The antibiotics were administered within an appropriate time frame prior to the initiation of the procedure. ANESTHESIA/SEDATION: Moderate (conscious)  sedation was employed during this procedure. A total of Versed 2 mg and Fentanyl 100 mcg was administered intravenously. Moderate Sedation Time: 16 minutes. The patient's level of consciousness and vital signs wer

## 2022-03-07 DIAGNOSIS — E782 Mixed hyperlipidemia: Secondary | ICD-10-CM | POA: Diagnosis not present

## 2022-03-07 DIAGNOSIS — N151 Renal and perinephric abscess: Secondary | ICD-10-CM | POA: Diagnosis not present

## 2022-03-07 DIAGNOSIS — I10 Essential (primary) hypertension: Secondary | ICD-10-CM | POA: Diagnosis not present

## 2022-03-07 DIAGNOSIS — A419 Sepsis, unspecified organism: Secondary | ICD-10-CM | POA: Diagnosis not present

## 2022-03-07 LAB — GLUCOSE, CAPILLARY
Glucose-Capillary: 184 mg/dL — ABNORMAL HIGH (ref 70–99)
Glucose-Capillary: 147 mg/dL — ABNORMAL HIGH (ref 70–99)
Glucose-Capillary: 146 mg/dL — ABNORMAL HIGH (ref 70–99)

## 2022-03-07 LAB — URINE CULTURE: Culture: >=100000 — AB

## 2022-03-07 MED ORDER — HYDRALAZINE HCL 25 MG PO TABS
25.0000 mg | ORAL_TABLET | Freq: Four times a day (QID) | ORAL | Status: DC | PRN
Start: 2022-03-07 — End: 2022-03-09

## 2022-03-07 NOTE — Progress Notes (Signed)
Urology Progress Note  ? ?Subjective: ?NAEON.  ?Left sided flank soreness ongoing but overall appears well ?Ucx growing Citrobacter, abscess cx w/ GNR's ? ?Objective: ?Vital signs in last 24 hours: ?Temp:  [98.4 ?F (36.9 ?C)-98.8 ?F (37.1 ?C)] 98.8 ?F (37.1 ?C) (04/16 0502) ?Pulse Rate:  [73-89] 75 (04/16 0502) ?Resp:  [18] 18 (04/16 0502) ?BP: (133-152)/(68-80) 152/80 (04/16 0502) ?SpO2:  [99 %-100 %] 100 % (04/16 0502) ? ?Intake/Output from previous day: ?04/15 0701 - 04/16 0700 ?In: 667.2 [P.O.:120; I.V.:547.2] ?Out: 20 [Drains:20] ?Intake/Output this shift: ?No intake/output data recorded. ? ?Physical Exam:  ?General: Alert and oriented ?CV: Regular rate ?Lungs: No increased work of breathing ?Abdomen: Soft ?GU: Left percutaneous drain draining reddish brown purulent fluid ?Ext: NT, No erythema ? ?Lab Results: ?Recent Labs  ?  03/04/22 ?2145 03/05/22 ?4665 03/06/22 ?9935  ?HGB 10.8* 9.3* 8.9*  ?HCT 31.6* 28.7* 26.8*  ? ? ?Recent Labs  ?  03/05/22 ?0312 03/06/22 ?7017  ?NA 139 139  ?K 3.4* 3.7  ?CL 110 105  ?CO2 22 29  ?GLUCOSE 155* 146*  ?BUN 9 9  ?CREATININE 0.94 0.92  ?CALCIUM 8.0* 8.4*  ? ? ? ?Studies/Results: ?CT IMAGE GUIDED DRAINAGE BY PERCUTANEOUS CATHETER ? ?Result Date: 03/05/2022 ?INDICATION: 56 year old female with history of left nephrolithiasis presenting with perinephric abscess. EXAM: CT IMAGE GUIDED DRAINAGE BY PERCUTANEOUS CATHETER COMPARISON:  CT abdomen pelvis from 03/04/2022 MEDICATIONS: The patient is currently admitted to the hospital and receiving intravenous antibiotics. The antibiotics were administered within an appropriate time frame prior to the initiation of the procedure. ANESTHESIA/SEDATION: Moderate (conscious) sedation was employed during this procedure. A total of Versed 2 mg and Fentanyl 100 mcg was administered intravenously. Moderate Sedation Time: 16 minutes. The patient's level of consciousness and vital signs were monitored continuously by radiology nursing throughout the  procedure under my direct supervision. CONTRAST:  None COMPLICATIONS: None immediate. PROCEDURE: Informed written consent was obtained from the patient after a discussion of the risks, benefits and alternatives to treatment. The patient was placed prone on the CT gantry and a pre procedural CT was performed re-demonstrating the known abscess/fluid collection within the left retroperitoneum. The procedure was planned. A timeout was performed prior to the initiation of the procedure. RADIATION DOSE REDUCTION: This exam was performed according to the departmental dose-optimization program which includes automated exposure control, adjustment of the mA and/or kV according to patient size and/or use of iterative reconstruction technique. The left flank was prepped and draped in the usual sterile fashion. The overlying soft tissues were anesthetized with 1% lidocaine with epinephrine. Appropriate trajectory was planned with the use of a 22 gauge spinal needle. An 18 gauge trocar needle was advanced into the abscess/fluid collection and a short Amplatz super stiff wire was coiled within the collection. Appropriate positioning was confirmed with a limited CT scan. The tract was serially dilated allowing placement of a 10.2 [place] all-purpose drainage catheter. Appropriate positioning was confirmed with a limited postprocedural CT scan. Approximately 80 ml of purulent fluid was aspirated. The tube was connected to a bulb suction and sutured in place. A dressing was placed. The patient tolerated the procedure well without immediate post procedural complication. IMPRESSION: Successful CT guided placement of a 10.2 [place] all purpose drain catheter into the left perinephric abscess with aspiration of 80 mL of purulent fluid. Samples were sent to the laboratory as requested by the ordering clinical team. PLAN: Initial request was for perinephric abscess drain and percutaneous nephrostomy access for eventual PCNL.  Given minimal  hydronephrosis and guarded access to the lower pole stones from the abscess, decision was made to place drain initially and defer percutaneous nephrostomy access. Consider repeat CT abdomen pelvis in 48-72 hours to assess resolution of abscess and possible window for percutaneous nephrostomy access. Ruthann Cancer, MD [facility] Electronically Signed   By: Ruthann Cancer M.D.   On: 03/05/2022 14:34   ? ?Assessment/Plan: ? ?56 y.o. female  w/ left large renal calculi, left distal ureteral calculi, and left perinephric abscess/psoas abscess s/p left percutaneous drain placement. VIR unable to place left PCN simultaneously. Clinically improving with drain placement. She has some flank pain but likely related to abscess and drainage procedure. She does not appear to be symptomatic from her stones and does not have significant hydro.  ? ?- Given clinical improvement with drainage and antibiotics, will hold off on further decompressive intervention (percutaneous nephrostomy tube and/or ureteral stenting) ?- If patient were to clinically decline then would consider cysto and ureteral stent placement  ?- Agree with broad spectrum antibiotics. Ucx w/ Citrobacter and drain fluid w/ abundant GNR's. Follow up cultures and narrow antibiotics as appropriate.  ?- Consider repeat imaging (CT A/P) early next week (Monday) ?- Will ultimately need left ureteroscopy and laser lithotripsy +/- PCNL (likely staged procedure) once medically stable. ?- We will follow during hospitalization ? ?Reola Mosher, MD ?[facility] Resident, PGY4 ?[facility] ? ? ? LOS: 2 days  ? ?

## 2022-03-07 NOTE — Progress Notes (Signed)
? ?PROGRESS NOTE ? ? ? ?Kimberly Herman  GUR:[id number] DOB: Aug 09, 1966 DOA: 03/04/2022 ?PCP: Bo Merino I, NP ? ? ?Brief Narrative: ?Kimberly Herman is a 56 y.o. female with a history of hypertension, hyperlipidemia, diabetes, chronic diastolic heart failure. Patient presented secondary to left flank pain and found to have a large left perinephric/left psoas abscess with associated infected stone, meeting sepsis criteria. Meropenem initiated. IR and urology consulted. ? ? ?Assessment and Plan: ?* Sepsis ([place]) ?Secondary to infected stone and kidney abscess. Blood and urine cultures obtained on admission. Empiric antibiotics initiated targeting urinary source. Sepsis physiology improved with initiation of antibiotics. ?-See problem, Perinephric abscess ? ?Ureteral calculus, left ?Associated mild hydronephrosis. Multiple stones. Large. Urology consulted with recommendation for nephrostomy tube. Unfortunately, no clear window was identified by IR, so nephrostomy tube was deferred. ?-Urology recommendations: holding off on consideration for nephrostomy tube/stent at this time ? ?Psoas abscess, left ([place]) ?See problem, Perinephric abscess ? ?Perinephric abscess ?Large abscess measuring 7.6 x 6.7 x 4.4 cm. IR consulted for percutaneous drain placement. Urine/blood cultures obtained. Empiric meropenem initiated on admission. Percutaneous drain placed by IR on 4/14. Wound culture with abundant GNR. Urine culture significant for Citrobacter Koseri. Switched from meropenem to Ceftriaxone. ?-Continue Ceftriaxone ?-Follow-up wound and bloodcultures ?-Follow-up IR recommendations/management for drain ?-Oxycodone PRN for pain ? ?ANEMIA, IRON DEFICIENCY, UNSPEC. ?Hemoglobin of 10.8 on admission. Downward drift. ?-CBC in AM ? ?Hyperlipidemia ?Patient is on atorvastatin as an outpatient. ?-Continue home atorvastatin ? ?HTN (hypertension) ?Patient is on chlorthalidone as an outpatient. Previously prescribed candesartan but listed as  not taking. Blood pressure was soft on admission and chlorthalidone was held. Slightly increased. ?-Watch BP ?-Hydralazine PO prn ? ?T2DM (type 2 diabetes mellitus) ([place]) ?Well controlled. Hemoglobin A1C of 6.7% from January 2023. Patient is on metformin as an outpatient. ?-SSI ? ?Hypomagnesemia-resolved as of 03/06/2022 ?Given magnesium supplementation. Resolved. ? ?Hypokalemia-resolved as of 03/06/2022 ?Given potassium supplementation. Resolved. ? ? ? ?DVT prophylaxis: SCDs ?Code Status:   Code Status: Full Code ?Family Communication: Husband at bedside ?Disposition Plan: Discharge home likely in 2-3 days pending IR/Urology recommendations, culture data, transition to oral antibiotics ? ? ?Consultants:  ?Urology ?Interventional radiology ? ?Procedures:  ?CT guided left perinephric drain placement (4/14) ? ?Antimicrobials: ?Meropenem  ?Ceftriaxone ? ? ?Subjective: ?Pain is improving. ? ?Objective: ?BP (!) 152/80 (BP Location: Right Arm)   Pulse 75   Temp 98.8 ?F (37.1 ?C)   Resp 18   Ht '5\' 7"'$  (1.702 m)   Wt 84.5 kg   SpO2 100%   BMI 29.18 kg/m?  ? ?Examination: ? ?General exam: Appears calm and comfortable ?Respiratory system: Clear to auscultation. Respiratory effort normal. ?Cardiovascular system: S1 & S2 heard, RRR. Possibly a faint systolic murmur ?Gastrointestinal system: Abdomen is nondistended, soft. Left flank tenderness. Normal bowel sounds heard. ?Central nervous system: Alert and oriented. No focal neurological deficits. ?Musculoskeletal: No edema. No calf tenderness ?Skin: No cyanosis. No rashes ?Psychiatry: Judgement and insight appear normal. Mood & affect appropriate.  ? ? ?Data Reviewed: I have personally reviewed following labs and imaging studies ? ?CBC ?Lab Results  ?Component Value Date  ? WBC 8.8 03/06/2022  ? RBC 3.09 (L) 03/06/2022  ? HGB 8.9 (L) 03/06/2022  ? HCT 26.8 (L) 03/06/2022  ? MCV 86.7 03/06/2022  ? MCH 28.8 03/06/2022  ? PLT 249 03/06/2022  ? MCHC 33.2 03/06/2022  ? RDW 15.4  03/06/2022  ? LYMPHSABS 1.6 03/05/2022  ? MONOABS 1.0 03/05/2022  ? EOSABS 0.0  03/05/2022  ? BASOSABS 0.1 03/05/2022  ? ? ? ?Last metabolic panel ?Lab Results  ?Component Value Date  ? NA 139 03/06/2022  ? K 3.7 03/06/2022  ? CL 105 03/06/2022  ? CO2 29 03/06/2022  ? BUN 9 03/06/2022  ? CREATININE 0.92 03/06/2022  ? GLUCOSE 146 (H) 03/06/2022  ? GFRNONAA >60 03/06/2022  ? GFRAA 64 01/02/2021  ? CALCIUM 8.4 (L) 03/06/2022  ? PHOS 3.5 03/05/2022  ? PROT 6.2 (L) 03/06/2022  ? ALBUMIN 2.6 (L) 03/06/2022  ? LABGLOB 3.9 10/19/2021  ? AGRATIO 1.1 (L) 10/19/2021  ? BILITOT 0.8 03/06/2022  ? ALKPHOS 57 03/06/2022  ? AST 20 03/06/2022  ? ALT 18 03/06/2022  ? ANIONGAP 5 03/06/2022  ? ? ?GFR: ?Estimated Creatinine Clearance: 75.4 mL/min (by C-G formula based on SCr of 0.92 mg/dL). ? ?Recent Results (from the past 240 hour(s))  ?Urine Culture     Status: Abnormal  ? Collection Time: 03/04/22 11:05 PM  ? Specimen: Urine, Clean Catch  ?Result Value Ref Range Status  ? Specimen Description   Final  ?  URINE, CLEAN CATCH ?Performed at [facility], [address], [place], [place] [address] ?  ? Special Requests   Final  ?  NONE ?Performed at [facility], [address], [place], [place] [address] ?  ? Culture >=100,000 COLONIES/mL CITROBACTER KOSERI (A)  Final  ? Report Status 03/07/2022 FINAL  Final  ? Organism ID, Bacteria CITROBACTER KOSERI (A)  Final  ?    Susceptibility  ? Citrobacter koseri - MIC*  ?  CEFAZOLIN <=4 SENSITIVE Sensitive   ?  CEFEPIME <=0.12 SENSITIVE Sensitive   ?  CEFTRIAXONE <=0.25 SENSITIVE Sensitive   ?  CIPROFLOXACIN 0.5 INTERMEDIATE Intermediate   ?  GENTAMICIN <=1 SENSITIVE Sensitive   ?  IMIPENEM <=0.25 SENSITIVE Sensitive   ?  NITROFURANTOIN 32 SENSITIVE Sensitive   ?  TRIMETH/SULFA <=20 SENSITIVE Sensitive   ?  PIP/TAZO 8 SENSITIVE Sensitive   ?  * >=100,000 COLONIES/mL CITROBACTER KOSERI  ?Blood culture (routine x 2)     Status: None (Preliminary result)  ? Collection Time:  03/04/22 11:14 PM  ? Specimen: BLOOD  ?Result Value Ref Range Status  ? Specimen Description   Final  ?  BLOOD RIGHT ANTECUBITAL ?Performed at [facility], [address] [place], [place] [address] ?  ? Special Requests   Final  ?  BOTTLES DRAWN AEROBIC AND ANAEROBIC Blood Culture adequate volume ?Performed at [facility], [address], [place], [place] [address] ?  ? Culture   Final  ?  NO GROWTH 2 DAYS ?Performed at [facility], [place] [address], [place], [place] [address] ?  ? Report Status PENDING  Incomplete  ?Blood culture (routine x 2)     Status: None (Preliminary result)  ? Collection Time: 03/04/22 11:20 PM  ? Specimen: BLOOD  ?Result Value Ref Range Status  ? Specimen Description   Final  ?  BLOOD SITE NOT SPECIFIED ?Performed at [facility], [address] [place], [place] [address] ?  ? Special Requests   Final  ?  BOTTLES DRAWN AEROBIC AND ANAEROBIC Blood Culture adequate volume ?Performed at [facility], [address] [place], [place] [address] ?  ? Culture   Final  ?  NO GROWTH 2 DAYS ?Performed at [facility], [place] [address], [place],  [address] ?  ?  Report Status PENDING  Incomplete  ?MRSA Next Gen by PCR, Nasal     Status: None  ? Collection Time: 03/05/22  2:28 AM  ? Specimen: Nasal Mucosa; Nasal Swab  ?Result Value Ref Range Status  ? MRSA by PCR Next Gen NOT DETECTED NOT DETECTED Final  ?  Comment: (NOTE) ?The GeneXpert MRSA Assay (FDA approved for NASAL specimens only), ?is one component of a comprehensive MRSA colonization surveillance ?program. It is not intended to diagnose MRSA infection nor to guide ?or monitor treatment for MRSA infections. ?Test performance is not FDA approved in patients less than 2 years ?old. ?Performed at [facility], [address] ?[place], [place] [address] ?  ?Aerobic/Anaerobic Culture w Gram Stain (surgical/deep wound)     Status: None (Preliminary result)  ?  Collection Time: 03/05/22 12:48 PM  ? Specimen: Abscess  ?Result Value Ref Range Status  ? Specimen Description   Final  ?  ABSCESS ?Performed at [facility], [place] [address] [place], [place]

## 2022-03-07 NOTE — TOC Initial Note (Signed)
Transition of Care (TOC) - Initial/Assessment Note  ? ? ?Patient Details  ?Name: Kimberly Herman ?MRN: [id number] ?Date of Birth: Jan 12, 1966 ? ?Transition of Care (TOC) CM/SW Contact:    ?Tawanna Cooler, RN ?Phone Number: ?03/07/2022, 2:36 PM ? ?Clinical Narrative:                 ? ?Transition of Care Department [facility]) has reviewed patient and no TOC needs have been identified at this time. We will continue to monitor patient advancement through interdisciplinary progression rounds. If new patient transition needs arise, please place a TOC consult. ? ?Expected Discharge Plan: Home/Self Care ?Barriers to Discharge: Continued Medical Work up ? ? ?Expected Discharge Plan and Services ?Expected Discharge Plan: Home/Self Care ?  ?  ?Living arrangements for the past 2 months: Apartment ?                ?  ?Prior Living Arrangements/Services ?Living arrangements for the past 2 months: Apartment ?Lives with:: Spouse ?Patient language and need for interpreter reviewed:: Yes ?       ?Need for Family Participation in Patient Care: Yes (Comment) ?Care giver support system in place?: Yes (comment) ?  ?Criminal Activity/Legal Involvement Pertinent to Current Situation/Hospitalization: No - Comment as needed ? ?Activities of Daily Living ?Home Assistive Devices/Equipment: None ?ADL Screening (condition at time of admission) ?Patient's cognitive ability adequate to safely complete daily activities?: Yes ?Is the patient deaf or have difficulty hearing?: No ?Does the patient have difficulty seeing, even when wearing glasses/contacts?: No ?Does the patient have difficulty concentrating, remembering, or making decisions?: No ?Patient able to express need for assistance with ADLs?: No ?Does the patient have difficulty dressing or bathing?: No ?Independently performs ADLs?: Yes (appropriate for developmental age) ?Does the patient have difficulty walking or climbing stairs?: Yes ?Weakness of Legs: None ?Weakness of Arms/Hands:  None ? ? ?Emotional Assessment ?  ?Orientation: : Oriented to Self, Oriented to Place, Oriented to  Time, Oriented to Situation ?Alcohol / Substance Use: Not Applicable ?Psych Involvement: No (comment) ? ?Admission diagnosis:  Kidney stone [N20.0] ?Acute cystitis without hematuria [N30.00] ?Sepsis ([place]) [A41.9] ?Sepsis, due to unspecified organism, unspecified whether acute organ dysfunction present ([place]) [A41.9] ?Patient Active Problem List  ? Diagnosis Date Noted  ? Sepsis ([place]) 03/05/2022  ? Perinephric abscess 03/05/2022  ? Psoas abscess, left ([place]) 03/05/2022  ? Ureteral calculus, left 03/05/2022  ? Vaginal polyp 10/30/2019  ? Encounter for gynecological examination with Papanicolaou smear of cervix 10/30/2019  ? Elevated glucose 08/30/2019  ? Hemoglobin A1C between 7% and 9% indicating borderline diabetic control ([place]) 08/30/2019  ? Hyperlipidemia 12/27/2017  ? Shortness of breath 12/27/2017  ? T2DM (type 2 diabetes mellitus) ([place]) 03/07/2017  ? Hyperglycemia 03/07/2017  ? HTN (hypertension) 03/07/2017  ? ANEMIA, IRON DEFICIENCY, UNSPEC. 01/19/2007  ? ?PCP:  Teena Dunk, NP ?Pharmacy:   ?[id number] [place] [address], [address] [facility] [address] ?[place] ?[place] [address] ?Phone: [phone] Fax: [phone] ? ?

## 2022-03-08 ENCOUNTER — Inpatient Hospital Stay (HOSPITAL_COMMUNITY): Payer: BLUE CROSS/BLUE SHIELD

## 2022-03-08 DIAGNOSIS — I10 Essential (primary) hypertension: Secondary | ICD-10-CM | POA: Diagnosis not present

## 2022-03-08 DIAGNOSIS — N151 Renal and perinephric abscess: Secondary | ICD-10-CM | POA: Diagnosis not present

## 2022-03-08 DIAGNOSIS — E782 Mixed hyperlipidemia: Secondary | ICD-10-CM | POA: Diagnosis not present

## 2022-03-08 DIAGNOSIS — A419 Sepsis, unspecified organism: Secondary | ICD-10-CM | POA: Diagnosis not present

## 2022-03-08 LAB — CBC
HCT: 28.4 % — ABNORMAL LOW (ref 36.0–46.0)
Hemoglobin: 9.2 g/dL — ABNORMAL LOW (ref 12.0–15.0)
MCH: 28.3 pg (ref 26.0–34.0)
MCHC: 32.4 g/dL (ref 30.0–36.0)
MCV: 87.4 fL (ref 80.0–100.0)
Platelets: 332 10*3/uL (ref 150–400)
RBC: 3.25 MIL/uL — ABNORMAL LOW (ref 3.87–5.11)
RDW: 15.2 % (ref 11.5–15.5)
WBC: 4.7 10*3/uL (ref 4.0–10.5)
nRBC: 0 % (ref 0.0–0.2)

## 2022-03-08 LAB — GLUCOSE, CAPILLARY
Glucose-Capillary: 117 mg/dL — ABNORMAL HIGH (ref 70–99)
Glucose-Capillary: 213 mg/dL — ABNORMAL HIGH (ref 70–99)
Glucose-Capillary: 189 mg/dL — ABNORMAL HIGH (ref 70–99)
Glucose-Capillary: 218 mg/dL — ABNORMAL HIGH (ref 70–99)

## 2022-03-08 MED ORDER — IOHEXOL 300 MG/ML  SOLN
100.0000 mL | Freq: Once | INTRAMUSCULAR | Status: AC | PRN
  Administered 2022-03-08: 100 mL via INTRAVENOUS

## 2022-03-08 MED ORDER — SODIUM CHLORIDE (PF) 0.9 % IJ SOLN
INTRAMUSCULAR | Status: AC
  Filled 2022-03-08: qty 50

## 2022-03-08 MED ORDER — CHLORTHALIDONE 25 MG PO TABS
25.0000 mg | ORAL_TABLET | Freq: Every day | ORAL | Status: DC
  Administered 2022-03-08 – 2022-03-09 (×2): 25 mg via ORAL
  Filled 2022-03-08 (×2): qty 1

## 2022-03-08 MED ORDER — CEFAZOLIN SODIUM-DEXTROSE 2-4 GM/100ML-% IV SOLN
2.0000 g | Freq: Three times a day (TID) | INTRAVENOUS | Status: DC
  Administered 2022-03-08 – 2022-03-09 (×4): 2 g via INTRAVENOUS
  Filled 2022-03-08 (×5): qty 100

## 2022-03-08 NOTE — Progress Notes (Signed)
? ?PROGRESS NOTE ? ? ? ?Kimberly Herman  TDV:[id number] DOB: 1966/01/19 DOA: 03/04/2022 ?PCP: Bo Merino I, NP ? ? ?Brief Narrative: ?Kimberly Herman is a 56 y.o. female with a history of hypertension, hyperlipidemia, diabetes, chronic diastolic heart failure. Patient presented secondary to left flank pain and found to have a large left perinephric/left psoas abscess with associated infected stone, meeting sepsis criteria. Meropenem initiated. IR and urology consulted. Left percutaneous drain placed for drainage of abscess. Urine and wound cultures significant for Citrobacter Koseri. Antibiotics switch to Cefazolin IV. ? ? ?Assessment and Plan: ?* Sepsis ([facility])-resolved as of 03/08/2022 ?Secondary to infected stone and kidney abscess. Blood and urine cultures obtained on admission. Empiric antibiotics initiated targeting urinary source. Sepsis physiology improved with initiation of antibiotics. ?-See problem, Perinephric abscess ? ?Ureteral calculus, left ?Associated mild hydronephrosis. Multiple stones. Large. Urology consulted with recommendation for nephrostomy tube. Unfortunately, no clear window was identified by IR, so nephrostomy tube was deferred. ?-Urology recommendations: holding off on consideration for nephrostomy tube/stent at this time in addition to repeat CT abdomen/pelvis ? ?Psoas abscess, left ([place]) ?See problem, Perinephric abscess ? ?Perinephric abscess ?Large abscess measuring 7.6 x 6.7 x 4.4 cm. IR consulted for percutaneous drain placement. Urine/blood cultures obtained. Empiric meropenem initiated on admission. Percutaneous drain placed by IR on 4/14. Wound culture with abundant GNR. Urine and wound cultures significant for Citrobacter Koseri. Switched from meropenem to Ceftriaxone.  ?-Switch to Cefazolin IV ?-Follow-up IR recommendations/management for drain ?-Oxycodone PRN for pain ? ?ANEMIA, IRON DEFICIENCY, UNSPEC. ?Hemoglobin of 10.8 on admission. Downward drift with  stabilization. ? ?Hyperlipidemia ?Patient is on atorvastatin as an outpatient. ?-Continue home atorvastatin ? ?HTN (hypertension) ?Patient is on chlorthalidone as an outpatient. Previously prescribed candesartan but listed as not taking. Blood pressure was soft on admission and chlorthalidone was held. Slightly increased. ?-Resume home chlorthalidone ?-Hydralazine PO prn ? ?T2DM (type 2 diabetes mellitus) ([place]) ?Well controlled. Hemoglobin A1C of 6.7% from January 2023. Patient is on metformin as an outpatient. ?-SSI ? ?Hypomagnesemia-resolved as of 03/06/2022 ?Given magnesium supplementation. Resolved. ? ?Hypokalemia-resolved as of 03/06/2022 ?Given potassium supplementation. Resolved. ? ? ? ?DVT prophylaxis: SCDs ?Code Status:   Code Status: Full Code ?Family Communication: Husband at bedside ?Disposition Plan: Discharge home likely in 2-3 days pending IR/Urology recommendations, culture data, transition to oral antibiotics ? ? ?Consultants:  ?Urology ?Interventional radiology ? ?Procedures:  ?CT guided left perinephric drain placement (4/14) ? ?Antimicrobials: ?Meropenem  ?Ceftriaxone ? ? ?Subjective: ?Some mildly worsened left flank pain closer to anterior abdomen. ? ?Objective: ?BP (!) 158/83 (BP Location: Right Arm)   Pulse 64   Temp 98 ?F (36.7 ?C) (Oral)   Resp 16   Ht '5\' 7"'$  (1.702 m)   Wt 84.5 kg   SpO2 99%   BMI 29.18 kg/m?  ? ?Examination: ? ?General exam: Appears calm and comfortable ?Respiratory system: Clear to auscultation. Respiratory effort normal. ?Cardiovascular system: S1 & S2 heard, RRR. ?Gastrointestinal system: Abdomen is nondistended, soft and is tender in left flank. Normal bowel sounds heard. ?Central nervous system: Alert and oriented. No focal neurological deficits. ?Musculoskeletal: No edema. No calf tenderness ?Skin: No cyanosis. No rashes ?Psychiatry: Judgement and insight appear normal. Mood & affect appropriate.   ? ? ?Data Reviewed: I have personally reviewed following labs and  imaging studies ? ?CBC ?Lab Results  ?Component Value Date  ? WBC 4.7 03/08/2022  ? RBC 3.25 (L) 03/08/2022  ? HGB 9.2 (L) 03/08/2022  ? HCT 28.4 (L) 03/08/2022  ?  MCV 87.4 03/08/2022  ? MCH 28.3 03/08/2022  ? PLT 332 03/08/2022  ? MCHC 32.4 03/08/2022  ? RDW 15.2 03/08/2022  ? LYMPHSABS 1.6 03/05/2022  ? MONOABS 1.0 03/05/2022  ? EOSABS 0.0 03/05/2022  ? BASOSABS 0.1 03/05/2022  ? ? ? ?Last metabolic panel ?Lab Results  ?Component Value Date  ? NA 139 03/06/2022  ? K 3.7 03/06/2022  ? CL 105 03/06/2022  ? CO2 29 03/06/2022  ? BUN 9 03/06/2022  ? CREATININE 0.92 03/06/2022  ? GLUCOSE 146 (H) 03/06/2022  ? GFRNONAA >60 03/06/2022  ? GFRAA 64 01/02/2021  ? CALCIUM 8.4 (L) 03/06/2022  ? PHOS 3.5 03/05/2022  ? PROT 6.2 (L) 03/06/2022  ? ALBUMIN 2.6 (L) 03/06/2022  ? LABGLOB 3.9 10/19/2021  ? AGRATIO 1.1 (L) 10/19/2021  ? BILITOT 0.8 03/06/2022  ? ALKPHOS 57 03/06/2022  ? AST 20 03/06/2022  ? ALT 18 03/06/2022  ? ANIONGAP 5 03/06/2022  ? ? ?GFR: ?Estimated Creatinine Clearance: 75.4 mL/min (by C-G formula based on SCr of 0.92 mg/dL). ? ?Recent Results (from the past 240 hour(s))  ?Urine Culture     Status: Abnormal  ? Collection Time: 03/04/22 11:05 PM  ? Specimen: Urine, Clean Catch  ?Result Value Ref Range Status  ? Specimen Description   Final  ?  URINE, CLEAN CATCH ?Performed at [facility], [address], [place], [place] [address] ?  ? Special Requests   Final  ?  NONE ?Performed at [facility], [address] [place], [place] [address] ?  ? Culture >=100,000 COLONIES/mL CITROBACTER KOSERI (A)  Final  ? Report Status 03/07/2022 FINAL  Final  ? Organism ID, Bacteria CITROBACTER KOSERI (A)  Final  ?    Susceptibility  ? Citrobacter koseri - MIC*  ?  CEFAZOLIN <=4 SENSITIVE Sensitive   ?  CEFEPIME <=0.12 SENSITIVE Sensitive   ?  CEFTRIAXONE <=0.25 SENSITIVE Sensitive   ?  CIPROFLOXACIN 0.5 INTERMEDIATE Intermediate   ?  GENTAMICIN <=1 SENSITIVE Sensitive   ?  IMIPENEM <=0.25 SENSITIVE  Sensitive   ?  NITROFURANTOIN 32 SENSITIVE Sensitive   ?  TRIMETH/SULFA <=20 SENSITIVE Sensitive   ?  PIP/TAZO 8 SENSITIVE Sensitive   ?  * >=100,000 COLONIES/mL CITROBACTER KOSERI  ?Blood culture (routine x 2)     Status: None (Preliminary result)  ? Collection Time: 03/04/22 11:14 PM  ? Specimen: BLOOD  ?Result Value Ref Range Status  ? Specimen Description   Final  ?  BLOOD RIGHT ANTECUBITAL ?Performed at [facility], [address], [place], [place] [address] ?  ? Special Requests   Final  ?  BOTTLES DRAWN AEROBIC AND ANAEROBIC Blood Culture adequate volume ?Performed at [facility], [address] [place], [place] [address] ?  ? Culture   Final  ?  NO GROWTH 3 DAYS ?Performed at [facility], [place] [address], [place], [place] [address] ?  ? Report Status PENDING  Incomplete  ?Blood culture (routine x 2)     Status: None (Preliminary result)  ? Collection Time: 03/04/22 11:20 PM  ? Specimen: BLOOD  ?Result Value Ref Range Status  ? Specimen Description   Final  ?  BLOOD SITE NOT SPECIFIED ?Performed at [facility], [address], [place], [place] [address] ?  ? Special Requests   Final  ?  BOTTLES DRAWN AEROBIC AND ANAEROBIC Blood Culture adequate volume ?Performed at [facility]  [address], [address], [place], [place] [address] ?  ? Culture   Final  ?  NO GROWTH 3 DAYS ?Performed at [facility], [place] [address] [place], [place] [address] ?  ? Report Status PENDING  Incomplete  ?MRSA Next Gen by PCR, Nasal     Status: None  ? Collection Time: 03/05/22  2:28 AM  ? Specimen: Nasal Mucosa; Nasal Swab  ?Result Value Ref Range Status  ? MRSA by PCR Next Gen NOT DETECTED NOT DETECTED Final  ?  Comment: (NOTE) ?The GeneXpert MRSA Assay (FDA approved for NASAL specimens only), ?is one component of a comprehensive MRSA colonization surveillance ?program. It is not intended to diagnose MRSA infection nor to guide ?or monitor treatment for MRSA infections. ?Test  performance is not FDA approved in patients less than 2 years ?old. ?Performed at [facility], [address] ?[place], [place] [address] ?  ?Aerobic/Anaerobic Culture w Gram Stain (surgical/deep wound)     Status: None

## 2022-03-08 NOTE — Progress Notes (Signed)
?Subjective: ?Patient feeling better, afebrile. No significant flank pain other than tube site. Urine/abscess cx-Citrobacter, now on Cefazolin.CT today reviewed shows marked improvement in abscess. ? ?Objective: ?Vital signs in last 24 hours: ?Temp:  [97.8 ?F (36.6 ?C)-98 ?F (36.7 ?C)] 97.8 ?F (36.6 ?C) (04/17 1142) ?Pulse Rate:  [64-74] 65 (04/17 1142) ?Resp:  [16-19] 19 (04/17 1142) ?BP: (158-166)/(75-87) 166/75 (04/17 1142) ?SpO2:  [97 %-99 %] 98 % (04/17 1142) ? ?Intake/Output from previous day: ?04/16 0701 - 04/17 0700 ?In: 240 [P.O.:240] ?Out: 30 [Drains:30] ?Intake/Output this shift: ?Total I/O ?In: 580 [P.O.:480; IV Piggyback:100] ?Out: -  ? ?Physical Exam:  ?General: Alert and oriented ?Lab Results: ?Recent Labs  ?  03/06/22 ?9147 03/08/22 ?0447  ?HGB 8.9* 9.2*  ?HCT 26.8* 28.4*  ? ?BMET ?Recent Labs  ?  03/06/22 ?8295  ?NA 139  ?K 3.7  ?CL 105  ?CO2 29  ?GLUCOSE 146*  ?BUN 9  ?CREATININE 0.92  ?CALCIUM 8.4*  ? ? ? ?Studies/Results: ?CT ABDOMEN PELVIS W CONTRAST ? ?Result Date: 03/08/2022 ?CLINICAL DATA:  Inpatient. Follow-up psoas muscle abscess status post percutaneous drainage. EXAM: CT ABDOMEN AND PELVIS WITH CONTRAST TECHNIQUE: Multidetector CT imaging of the abdomen and pelvis was performed using the standard protocol following bolus administration of intravenous contrast. RADIATION DOSE REDUCTION: This exam was performed according to the departmental dose-optimization program which includes automated exposure control, adjustment of the mA and/or kV according to patient size and/or use of iterative reconstruction technique. CONTRAST:  136m OMNIPAQUE IOHEXOL 300 MG/ML  SOLN COMPARISON:  03/04/2022 CT abdomen/pelvis. FINDINGS: Lower chest: No significant pulmonary nodules or acute consolidative airspace disease. Hepatobiliary: Diffuse hepatic steatosis. Normal liver size. No definite liver surface irregularity. No liver masses. Normal gallbladder with no radiopaque cholelithiasis. No biliary ductal  dilatation. Pancreas: Normal, with no mass or duct dilation. Spleen: Normal size. No mass. Adrenals/Urinary Tract: Normal adrenals. Normal right kidney with no right hydronephrosis. Clustered distal left pelvic ureteral stones measuring up to 28 x 9 mm in total, not appreciably changed. Multiple large irregular lower left renal nonobstructing stones, largest 21 x 16 mm, unchanged. No overt left hydronephrosis. Mild urothelial wall thickening throughout the central left renal collecting system and left lumbar ureter, unchanged. No residual measurable posterior left perinephric/left psoas muscle abscess status post percutaneous drain placement with well-positioned distal drain pigtail tip posterior to the inferior left kidney, with residual surrounding extensive patchy fat stranding. Residual mild asymmetric thickening of the left psoas and quadratus lumborum muscles, improved. Normal bladder. Stomach/Bowel: Normal non-distended stomach. Normal caliber small bowel with no small bowel wall thickening. Normal appendix. Normal large bowel with no diverticulosis, large bowel wall thickening or pericolonic fat stranding. Vascular/Lymphatic: Normal caliber abdominal aorta. Patent portal, splenic, hepatic and renal veins. No pathologically enlarged lymph nodes in the abdomen or pelvis. Reproductive: Enlarged myomatous uterus with dominant 5.9 cm posterior right uterine body fibroid, unchanged. No adnexal masses. Other: No pneumoperitoneum, ascites or focal fluid collection. Musculoskeletal: No aggressive appearing focal osseous lesions. IMPRESSION: 1. No residual measurable posterior left perinephric/left psoas muscle abscess status post placement of well-positioned percutaneous drain. Residual extensive patchy posterior perinephric fat stranding and asymmetric thickening of the left psoas and quadratus lumborum muscles, improved. 2. Clustered distal left pelvic ureteral stones measuring up to 28 x 9 mm in total, not  appreciably changed. Multiple large irregular lower left renal nonobstructing stones, largest 21 x 16 mm, unchanged. No overt left hydronephrosis. 3. Diffuse hepatic steatosis. 4. Enlarged myomatous uterus. Electronically Signed  By: Ilona Sorrel M.D.   On: 03/08/2022 16:30   ? ?Assessment/Plan: ?Psoas/left perinephric abscess-Continue perc drain,will likely need to maintain for additional 2-3 weeks.Consider d/c home on Po Keflex tid based on sensitivities.Will need [facility] f/u appt in 7- 10 days to reassess for ureteral  stone procedure ?Left ureteral calculi-will need cysto Left ureteroscopy with laser/left JJ stent in 2-3 weeks as outpatient. Would keep Perc drain tube until ureteral stones treated and has stent in place ?Left Renal calculi-ultimately needs Left PCNL but likely the last of staged procedures after ureteral stones treated. ? ? ? LOS: 3 days  ? ?Remi Haggard ?03/08/2022, 4:40 PM ? ?  ?

## 2022-03-08 NOTE — Progress Notes (Signed)
Pharmacy Antibiotic Note ? ?Kimberly Herman is a 56 y.o. female admitted on 03/04/2022 with sepsis, secondary to infected stone and kidney abscess.  Initially started on Zosyn then changed to meropenem then changed to ceftriaxone.  Today, antibiotic regimen has been further deescalated to Ancef for which Pharmacy has been consulted to dose. ? ?Plan: ?Discontinue Rocephin ?Begin Ancef 2 g IV every 8 hours ?Pharmacy will formally sign off consult but will monitor clinical progress & renal function (and renally adjust dose per protocol) ?Please reconsult if a change in clinical status warrants re-evaluation of dosage. ? ? ? ?Height: '5\' 7"'$  (170.2 cm) ?Weight: 84.5 kg (186 lb 4.6 oz) ?IBW/kg (Calculated) : 61.6 ? ?Temp (24hrs), Avg:98 ?F (36.7 ?C), Min:97.8 ?F (36.6 ?C), Max:98.2 ?F (36.8 ?C) ? ?Recent Labs  ?Lab 03/04/22 ?2145 03/04/22 ?2314 03/05/22 ?5462 03/06/22 ?7035 03/08/22 ?0447  ?WBC 13.4*  --  12.1* 8.8 4.7  ?CREATININE 1.06*  --  0.94 0.92  --   ?LATICACIDVEN  --  1.1 0.9  --   --   ?  ?Estimated Creatinine Clearance: 75.4 mL/min (by C-G formula based on SCr of 0.92 mg/dL).   ? ?Allergies  ?Allergen Reactions  ? Amlodipine Swelling  ? Bactrim [Sulfamethoxazole-Trimethoprim] Nausea Only  ?  Extreme nausea and felt really bad  ? Carvedilol Diarrhea  ? ? ?Antimicrobials this admission: ?4/14 Zosyn x 1 ?4/14 meropenem x 1 ?4/16 Rocephin >> 4/16 ?4/17 Ancef>> ? ? ?Microbiology results: ?4/13 BCx: ngtd ?4/13 UCx: citrobacter koseri   ?4/14 abscess: citrobacter koseri ?4/14 MRSA PCR: neg ? ?Thank you for allowing pharmacy to be a part of this patient?s care. ? ?Royetta Asal, PharmD, BCPS ?Clinical Pharmacist ?[place] ?Please utilize Amion for appropriate phone number to reach the unit pharmacist ([place]) ?03/08/2022 12:16 PM ? ? ?

## 2022-03-08 NOTE — Progress Notes (Signed)
? ? ?Referring Physician(s): ?Newsome,G ? ?Supervising Physician: Arne Cleveland ? ?Patient Status:  [facility] ? ?Chief Complaint: ? ?Left flank pain/perinephric abscess ? ?Subjective: ?Pt feeling a little better today; still has some left flank soreness, more so with deep breathing; denies fever, N/V ? ? ?Allergies: ?Amlodipine, Bactrim [sulfamethoxazole-trimethoprim], and Carvedilol ? ?Medications: ?Prior to Admission medications   ?Medication Sig Start Date End Date Taking? Authorizing Provider  ?atorvastatin (LIPITOR) 40 MG tablet Take 1 tablet (40 mg total) by mouth daily. 10/01/21 03/06/23 Yes Passmore, Jake Church I, NP  ?chlorthalidone (HYGROTON) 25 MG tablet Take 1 tablet (25 mg total) by mouth daily. 12/21/21 06/19/22 Yes Passmore, Jake Church I, NP  ?gabapentin (NEURONTIN) 300 MG capsule Take 1 capsule (300 mg total) by mouth 3 (three) times daily. ?Patient taking differently: Take 300 mg by mouth daily as needed (for pain). 10/01/21  Yes Passmore, Jake Church I, NP  ?insulin detemir (LEVEMIR) 100 UNIT/ML injection Inject 0.1 mLs (10 Units total) into the skin at bedtime. ?Patient taking differently: Inject 10 Units into the skin at bedtime as needed (for blood glucose greater than 240). 10/20/21 03/06/23 Yes Passmore, Jake Church I, NP  ?metFORMIN (GLUCOPHAGE) 1000 MG tablet Take 1 tablet (1,000 mg total) by mouth 2 (two) times daily with a meal. 10/01/21 03/06/23 Yes Passmore, Jake Church I, NP  ?omeprazole (PRILOSEC) 20 MG capsule Take 1 capsule (20 mg total) by mouth daily. ?Patient taking differently: Take 20 mg by mouth daily as needed (for heartburn). 10/19/21  Yes Passmore, Jake Church I, NP  ?ondansetron (ZOFRAN-ODT) 4 MG disintegrating tablet Take 1 tablet (4 mg total) by mouth every 8 (eight) hours as needed for nausea. 10/19/21  Yes Passmore, Jake Church I, NP  ?blood glucose meter kit and supplies KIT Dispense based on patient and insurance preference. Use up to four times daily as directed. (FOR ICD-9 250.00, 250.01). 08/29/19    Azzie Glatter, FNP  ?Blood Pressure Monitoring (BLOOD PRESSURE CUFF) MISC Check blood pressure once daily at the same time and record readings 03/07/17   Scot Jun, FNP  ?candesartan (ATACAND) 32 MG tablet Take 1 tablet (32 mg total) by mouth daily. ?Patient not taking: Reported on 03/05/2022 12/21/21 12/21/22  Bo Merino I, NP  ?glucose blood (TRUE METRIX BLOOD GLUCOSE TEST) test strip TEST UP TO FOUR TIMES DAILY 05/11/17   Scot Jun, FNP  ?potassium chloride SA (KLOR-CON M) 20 MEQ tablet Take 1 tablet (20 mEq total) by mouth 2 (two) times daily. ?Patient not taking: Reported on 03/05/2022 10/20/21 12/19/21  Bo Merino I, NP  ? ? ? ?Vital Signs: ?BP (!) 166/75 (BP Location: Right Arm)   Pulse 65   Temp 97.8 ?F (36.6 ?C) (Oral)   Resp 19   Ht _0  (1.702 m)   Wt 186 lb 4.6 oz (84.5 kg)   SpO2 98%   BMI 29.18 kg/m?  ? ?Physical Exam awake/alert; left perinephric drain intact, insertion site mild- mod tender, output 30 cc turbid bloody fluid ? ?Imaging: ?DG Chest 2 View ? ?Result Date: 03/04/2022 ?CLINICAL DATA:  Left back pain EXAM: CHEST - 2 VIEW COMPARISON:  01/25/2022 FINDINGS: The heart size and mediastinal contours are within normal limits. Both lungs are clear. The visualized skeletal structures are unremarkable. IMPRESSION: No active cardiopulmonary disease. Electronically Signed   By: Donavan Foil M.D.   On: 03/04/2022 20:56  ? ?CT ABDOMEN PELVIS W CONTRAST ? ?Result Date: 03/05/2022 ?CLINICAL DATA:  Low back pain EXAM: CT ABDOMEN AND PELVIS WITH CONTRAST TECHNIQUE:  Multidetector CT imaging of the abdomen and pelvis was performed using the standard protocol following bolus administration of intravenous contrast. RADIATION DOSE REDUCTION: This exam was performed according to the departmental dose-optimization program which includes automated exposure control, adjustment of the mA and/or kV according to patient size and/or use of iterative reconstruction technique. CONTRAST:   169m OMNIPAQUE IOHEXOL 300 MG/ML  SOLN COMPARISON:  CT 03/04/2022 FINDINGS: Lower chest: Lung bases demonstrate no acute consolidation or pleural effusion. Normal cardiac size. Hepatobiliary: Hepatic steatosis. No calcified gallstone or biliary dilatation Pancreas: Unremarkable. No pancreatic ductal dilatation or surrounding inflammatory changes. Spleen: Normal in size without focal abnormality. Adrenals/Urinary Tract: Adrenal glands are normal. Right kidney shows no hydronephrosis. Minimal left hydronephrosis and proximal hydroureter with more normal caliber distal ureter. Redemonstrated multiple stacked stones in the distal left ureter proximal to the UVJ. Considerable left perinephric fat stranding. Irregular rim enhancing fluid collection within the left perinephric space with mass effect on the lower pole of the left kidney. This is contiguous with multi loculated rim enhancing collection within the left retroperitoneum involving the psoas muscle. Collection measures approximately 7.6 cm craniocaudad by 6.7 cm AP by 4.4 cm transverse. Edema and asymmetric thickening of the left iliacus muscle without definitive enhancing fluid collection. Bladder is unremarkable. Some excretion of contrast from left kidney on delayed views. Stomach/Bowel: The stomach is nonenlarged. No dilated small bowel. No acute bowel wall thickening. Vascular/Lymphatic: Nonaneurysmal aorta. Mild retroperitoneal adenopathy. Reproductive: Enlarged lobulated uterus with multiple masses consistent with fibroids. No adnexal mass Other: Negative for pelvic effusion or free air Musculoskeletal: No acute osseous abnormality. Enlarged left psoas and iliacus muscles consistent with edema. Left psoas muscle appears involved by the multiloculated abscess in the retroperitoneum. IMPRESSION: 1. Considerable left perinephric inflammatory change with large 7.6 by 6.7 x 4.4 cm multiloculated rim enhancing irregular fluid collection within the left  perinephric space/left retroperitoneum and partially involving the left psoas muscle, consistent with a large abscess. 2. Multiple large left kidney stones as before. Only mild left hydronephrosis and proximal hydroureter. Redemonstrated multiple stacked stones in the distal ureter proximal to the left UVJ. 3. Enlarged fibroid uterus Electronically Signed   By: KDonavan FoilM.D.   On: 03/05/2022 00:02  ? ?CT Renal Stone Study ? ?Result Date: 03/04/2022 ?CLINICAL DATA:  Left flank pain EXAM: CT ABDOMEN AND PELVIS WITHOUT CONTRAST TECHNIQUE: Multidetector CT imaging of the abdomen and pelvis was performed following the standard protocol without IV contrast. RADIATION DOSE REDUCTION: This exam was performed according to the departmental dose-optimization program which includes automated exposure control, adjustment of the mA and/or kV according to patient size and/or use of iterative reconstruction technique. COMPARISON:  None. FINDINGS: Lower chest: Lung bases demonstrate no acute consolidation or effusion. Normal cardiac size. Hepatobiliary: Liver is enlarged measuring 21 cm craniocaudad. Diffuse hypodensity consistent with steatosis. No calcified gallstone or biliary dilatation Pancreas: Unremarkable. No pancreatic ductal dilatation or surrounding inflammatory changes. Spleen: Normal in size without focal abnormality. Adrenals/Urinary Tract: Adrenal glands are normal. The right kidney shows no hydronephrosis. Large lower pole stones on the left measuring to 28 mm. There may be minimal hydronephrosis. There are multiple stacked stones in the distal ureter proximal to the UVJ, stones extend over a 3.1 cm craniocaudad length of ureter with the largest stone seen distally and measuring 9 mm. Exophytic mass off the lower pole left kidney measuring 4.5 by 3 cm, series 2, image 34. Stomach/Bowel: The stomach is nonenlarged. No dilated small bowel. No acute bowel  wall thickening. Vascular/Lymphatic: Mild atherosclerosis. No  aneurysm. Mild left retroperitoneal nodes up to 12 mm. Reproductive: Enlarged lobulated uterus with multiple masses consistent with fibroids. No adnexal mass Other: Negative for pelvic effusion or free air. Asymmetric e

## 2022-03-08 NOTE — Plan of Care (Signed)
  Problem: Activity: Goal: Risk for activity intolerance will decrease Outcome: Progressing   Problem: Nutrition: Goal: Adequate nutrition will be maintained Outcome: Progressing   Problem: Coping: Goal: Level of anxiety will decrease Outcome: Progressing   

## 2022-03-09 DIAGNOSIS — D509 Iron deficiency anemia, unspecified: Secondary | ICD-10-CM

## 2022-03-09 DIAGNOSIS — A419 Sepsis, unspecified organism: Secondary | ICD-10-CM | POA: Diagnosis not present

## 2022-03-09 DIAGNOSIS — E785 Hyperlipidemia, unspecified: Secondary | ICD-10-CM

## 2022-03-09 DIAGNOSIS — I10 Essential (primary) hypertension: Secondary | ICD-10-CM | POA: Diagnosis not present

## 2022-03-09 DIAGNOSIS — N151 Renal and perinephric abscess: Secondary | ICD-10-CM | POA: Diagnosis not present

## 2022-03-09 LAB — CBC
HCT: 30.3 % — ABNORMAL LOW (ref 36.0–46.0)
Hemoglobin: 10.1 g/dL — ABNORMAL LOW (ref 12.0–15.0)
MCH: 28.5 pg (ref 26.0–34.0)
MCHC: 33.3 g/dL (ref 30.0–36.0)
MCV: 85.4 fL (ref 80.0–100.0)
Platelets: 391 10*3/uL (ref 150–400)
RBC: 3.55 MIL/uL — ABNORMAL LOW (ref 3.87–5.11)
RDW: 15.1 % (ref 11.5–15.5)
WBC: 5.9 10*3/uL (ref 4.0–10.5)
nRBC: 0 % (ref 0.0–0.2)

## 2022-03-09 LAB — COMPREHENSIVE METABOLIC PANEL
ALT: 14 U/L (ref 0–44)
AST: 20 U/L (ref 15–41)
Albumin: 3.1 g/dL — ABNORMAL LOW (ref 3.5–5.0)
Alkaline Phosphatase: 73 U/L (ref 38–126)
Anion gap: 8 (ref 5–15)
BUN: 10 mg/dL (ref 6–20)
CO2: 29 mmol/L (ref 22–32)
Calcium: 8.9 mg/dL (ref 8.9–10.3)
Chloride: 105 mmol/L (ref 98–111)
Creatinine, Ser: 0.93 mg/dL (ref 0.44–1.00)
GFR, Estimated: >60 mL/min (ref >60)
Glucose, Bld: 146 mg/dL — ABNORMAL HIGH (ref 70–99)
Potassium: 3.8 mmol/L (ref 3.5–5.1)
Sodium: 142 mmol/L (ref 135–145)
Total Bilirubin: 0.5 mg/dL (ref 0.3–1.2)
Total Protein: 7.3 g/dL (ref 6.5–8.1)

## 2022-03-09 LAB — GLUCOSE, CAPILLARY
Glucose-Capillary: 136 mg/dL — ABNORMAL HIGH (ref 70–99)
Glucose-Capillary: 177 mg/dL — ABNORMAL HIGH (ref 70–99)

## 2022-03-09 MED ORDER — SODIUM CHLORIDE 0.9% FLUSH: 5.0000 mL | Freq: Three times a day (TID) | INTRAVENOUS | Status: DC

## 2022-03-09 MED ORDER — CEFADROXIL 500 MG PO CAPS
500.0000 mg | ORAL_CAPSULE | Freq: Two times a day (BID) | ORAL | 0 refills | Status: AC
Start: 2022-03-09 — End: 2022-03-23

## 2022-03-09 NOTE — Progress Notes (Incomplete)
? ? ?Referring Physician(s): ?* No referring provider recorded for this case * ? ?Supervising Physician: {Supervising Physician:[id number]} ? ?Patient Status:  {IR Patient Status:21574} ? ?Chief Complaint: ? ?*** ? ?Subjective: ? ?*** ? ?Allergies: ?Amlodipine, Bactrim [sulfamethoxazole-trimethoprim], and Carvedilol ? ?Medications: ?Prior to Admission medications   ?Medication Sig Start Date End Date Taking? Authorizing Provider  ?atorvastatin (LIPITOR) 40 MG tablet Take 1 tablet (40 mg total) by mouth daily. 10/01/21 03/06/23 Yes Passmore, Jake Church I, NP  ?chlorthalidone (HYGROTON) 25 MG tablet Take 1 tablet (25 mg total) by mouth daily. 12/21/21 06/19/22 Yes Passmore, Jake Church I, NP  ?gabapentin (NEURONTIN) 300 MG capsule Take 1 capsule (300 mg total) by mouth 3 (three) times daily. ?Patient taking differently: Take 300 mg by mouth daily as needed (for pain). 10/01/21  Yes Passmore, Jake Church I, NP  ?insulin detemir (LEVEMIR) 100 UNIT/ML injection Inject 0.1 mLs (10 Units total) into the skin at bedtime. ?Patient taking differently: Inject 10 Units into the skin at bedtime as needed (for blood glucose greater than 240). 10/20/21 03/06/23 Yes Passmore, Jake Church I, NP  ?metFORMIN (GLUCOPHAGE) 1000 MG tablet Take 1 tablet (1,000 mg total) by mouth 2 (two) times daily with a meal. 10/01/21 03/06/23 Yes Passmore, Jake Church I, NP  ?omeprazole (PRILOSEC) 20 MG capsule Take 1 capsule (20 mg total) by mouth daily. ?Patient taking differently: Take 20 mg by mouth daily as needed (for heartburn). 10/19/21  Yes Passmore, Jake Church I, NP  ?ondansetron (ZOFRAN-ODT) 4 MG disintegrating tablet Take 1 tablet (4 mg total) by mouth every 8 (eight) hours as needed for nausea. 10/19/21  Yes Passmore, Jake Church I, NP  ?blood glucose meter kit and supplies KIT Dispense based on patient and insurance preference. Use up to four times daily as directed. (FOR ICD-9 250.00, 250.01). 08/29/19   Azzie Glatter, FNP  ?Blood Pressure Monitoring (BLOOD PRESSURE CUFF)  MISC Check blood pressure once daily at the same time and record readings 03/07/17   Scot Jun, FNP  ?candesartan (ATACAND) 32 MG tablet Take 1 tablet (32 mg total) by mouth daily. ?Patient not taking: Reported on 03/05/2022 12/21/21 12/21/22  Bo Merino I, NP  ?glucose blood (TRUE METRIX BLOOD GLUCOSE TEST) test strip TEST UP TO FOUR TIMES DAILY 05/11/17   Scot Jun, FNP  ?potassium chloride SA (KLOR-CON M) 20 MEQ tablet Take 1 tablet (20 mEq total) by mouth 2 (two) times daily. ?Patient not taking: Reported on 03/05/2022 10/20/21 12/19/21  Bo Merino I, NP  ? ? ? ?Vital Signs: ?BP (!) 164/82 (BP Location: Right Arm)   Pulse 71   Temp 97.8 ?F (36.6 ?C) (Oral)   Resp 20   Ht '5\' 7"'  (1.702 m)   Wt 186 lb 4.6 oz (84.5 kg)   SpO2 100%   BMI 29.18 kg/m?  ? ?Physical Exam ? ?Imaging: ?CT ABDOMEN PELVIS W CONTRAST ? ?Result Date: 03/08/2022 ?CLINICAL DATA:  Inpatient. Follow-up psoas muscle abscess status post percutaneous drainage. EXAM: CT ABDOMEN AND PELVIS WITH CONTRAST TECHNIQUE: Multidetector CT imaging of the abdomen and pelvis was performed using the standard protocol following bolus administration of intravenous contrast. RADIATION DOSE REDUCTION: This exam was performed according to the departmental dose-optimization program which includes automated exposure control, adjustment of the mA and/or kV according to patient size and/or use of iterative reconstruction technique. CONTRAST:  146m OMNIPAQUE IOHEXOL 300 MG/ML  SOLN COMPARISON:  03/04/2022 CT abdomen/pelvis. FINDINGS: Lower chest: No significant pulmonary nodules or acute consolidative airspace disease. Hepatobiliary: Diffuse hepatic steatosis. Normal liver size.  No definite liver surface irregularity. No liver masses. Normal gallbladder with no radiopaque cholelithiasis. No biliary ductal dilatation. Pancreas: Normal, with no mass or duct dilation. Spleen: Normal size. No mass. Adrenals/Urinary Tract: Normal adrenals. Normal  right kidney with no right hydronephrosis. Clustered distal left pelvic ureteral stones measuring up to 28 x 9 mm in total, not appreciably changed. Multiple large irregular lower left renal nonobstructing stones, largest 21 x 16 mm, unchanged. No overt left hydronephrosis. Mild urothelial wall thickening throughout the central left renal collecting system and left lumbar ureter, unchanged. No residual measurable posterior left perinephric/left psoas muscle abscess status post percutaneous drain placement with well-positioned distal drain pigtail tip posterior to the inferior left kidney, with residual surrounding extensive patchy fat stranding. Residual mild asymmetric thickening of the left psoas and quadratus lumborum muscles, improved. Normal bladder. Stomach/Bowel: Normal non-distended stomach. Normal caliber small bowel with no small bowel wall thickening. Normal appendix. Normal large bowel with no diverticulosis, large bowel wall thickening or pericolonic fat stranding. Vascular/Lymphatic: Normal caliber abdominal aorta. Patent portal, splenic, hepatic and renal veins. No pathologically enlarged lymph nodes in the abdomen or pelvis. Reproductive: Enlarged myomatous uterus with dominant 5.9 cm posterior right uterine body fibroid, unchanged. No adnexal masses. Other: No pneumoperitoneum, ascites or focal fluid collection. Musculoskeletal: No aggressive appearing focal osseous lesions. IMPRESSION: 1. No residual measurable posterior left perinephric/left psoas muscle abscess status post placement of well-positioned percutaneous drain. Residual extensive patchy posterior perinephric fat stranding and asymmetric thickening of the left psoas and quadratus lumborum muscles, improved. 2. Clustered distal left pelvic ureteral stones measuring up to 28 x 9 mm in total, not appreciably changed. Multiple large irregular lower left renal nonobstructing stones, largest 21 x 16 mm, unchanged. No overt left hydronephrosis.  3. Diffuse hepatic steatosis. 4. Enlarged myomatous uterus. Electronically Signed   By: Ilona Sorrel M.D.   On: 03/08/2022 16:30  ? ?CT IMAGE GUIDED DRAINAGE BY PERCUTANEOUS CATHETER ? ?Result Date: 03/05/2022 ?INDICATION: 56 year old female with history of left nephrolithiasis presenting with perinephric abscess. EXAM: CT IMAGE GUIDED DRAINAGE BY PERCUTANEOUS CATHETER COMPARISON:  CT abdomen pelvis from 03/04/2022 MEDICATIONS: The patient is currently admitted to the hospital and receiving intravenous antibiotics. The antibiotics were administered within an appropriate time frame prior to the initiation of the procedure. ANESTHESIA/SEDATION: Moderate (conscious) sedation was employed during this procedure. A total of Versed 2 mg and Fentanyl 100 mcg was administered intravenously. Moderate Sedation Time: 16 minutes. The patient's level of consciousness and vital signs were monitored continuously by radiology nursing throughout the procedure under my direct supervision. CONTRAST:  None COMPLICATIONS: None immediate. PROCEDURE: Informed written consent was obtained from the patient after a discussion of the risks, benefits and alternatives to treatment. The patient was placed prone on the CT gantry and a pre procedural CT was performed re-demonstrating the known abscess/fluid collection within the left retroperitoneum. The procedure was planned. A timeout was performed prior to the initiation of the procedure. RADIATION DOSE REDUCTION: This exam was performed according to the departmental dose-optimization program which includes automated exposure control, adjustment of the mA and/or kV according to patient size and/or use of iterative reconstruction technique. The left flank was prepped and draped in the usual sterile fashion. The overlying soft tissues were anesthetized with 1% lidocaine with epinephrine. Appropriate trajectory was planned with the use of a 22 gauge spinal needle. An 18 gauge trocar needle was  advanced into the abscess/fluid collection and a short Amplatz super stiff wire was coiled within the  collection. Appropriate positioning was confirmed with a limited CT scan. The tract was serially dilated allowing

## 2022-03-09 NOTE — TOC Transition Note (Signed)
Transition of Care (TOC) - CM/SW Discharge Note ? ? ?Patient Details  ?Name: Kimberly Herman ?MRN: [id number] ?Date of Birth: [birth year] ? ?Transition of Care [facility]) CM/SW Contact:  ?Dessa Phi, RN ?Phone Number: ?03/09/2022, 12:22 PM ? ? ?Clinical Narrative: d/c home. No CM needs.   ? ? ? ?  ?Barriers to Discharge: No Barriers Identified ? ? ?Patient Goals and CMS Choice ?  ?  ?  ? ?Discharge Placement ?  ?           ?  ?  ?  ?  ? ?Discharge Plan and Services ?  ?  ?           ?  ?  ?  ?  ?  ?  ?  ?  ?  ?  ? ?Social Determinants of Health (SDOH) Interventions ?  ? ? ?Readmission Risk Interventions ?   ? View : No data to display.  ?  ?  ?  ? ? ? ? ? ?

## 2022-03-09 NOTE — Progress Notes (Signed)
On call APP Olena Heckle notified of pt SBP in 160s through the night. Request for PRN dose of hydralazine to be adjusted for coverage.  ? ?As per on call APP, PRN dose not changed, "Hospitalist allowing permissive higher BPs due to the Chlorthalidone" and to avoid probability of compromising pt "kidney perfusion since she is chronic HTN and had low bps earlier" ? ? ?Pt updated on plan of care and denied pain/discomfort associated with BP throughout the night. ? ?[facility] notified of BP events . ?

## 2022-03-09 NOTE — Discharge Summary (Signed)
?Physician Discharge Summary ?  ?Patient: Kimberly Herman MRN: [id number] DOB: August 07, 1966  ?Admit date:     03/04/2022  ?Discharge date: 03/09/22  ?Discharge Physician: Cordelia Poche, MD  ? ?PCP: Teena Dunk, NP  ? ?Recommendations at discharge:  ? ?Follow-up with Urology, Interventional radiology and infectious disease ?Continue drain and antibiotics ?PCP follow-up ?Repeat BMP in 3-5 days ? ?Discharge Diagnoses: ?Active Problems: ?  Perinephric abscess ?  Psoas abscess, left ([place]) ?  Ureteral calculus, left ?  ANEMIA, IRON DEFICIENCY, UNSPEC. ?  T2DM (type 2 diabetes mellitus) ([place]) ?  HTN (hypertension) ?  Hyperlipidemia ? ?Principal Problem (Resolved): ?  Sepsis ([facility]) ?Resolved Problems: ?  Hypokalemia ?  Hypomagnesemia ? ?Hospital Course: ?Kimberly RUPPERT is a 56 y.o. female with a history of hypertension, hyperlipidemia, diabetes, chronic diastolic heart failure. Patient presented secondary to left flank pain and found to have a large left perinephric/left psoas abscess with associated infected stone, meeting sepsis criteria. Meropenem initiated. IR and urology consulted. Left percutaneous drain placed for drainage of abscess. Urine and wound cultures significant for Citrobacter Koseri. Antibiotics switch to Cefazolin IV and finally to cefadroxil on discharge. Infectious disease recommending a 14 day course starting on day of discharge, in addition to outpatient follow-up. Patient to follow-up with IR and urology as an outpatient. ? ?Assessment and Plan: ?* Sepsis ([facility])-resolved as of 03/08/2022 ?Secondary to infected stone and kidney abscess. Blood and urine cultures obtained on admission. Empiric antibiotics initiated targeting urinary source. Sepsis physiology improved with initiation of antibiotics. See problem, Perinephric abscess ? ?Ureteral calculus, left ?Associated mild hydronephrosis. Multiple stones. Large. Urology consulted with recommendation for nephrostomy tube. Unfortunately, no clear window was  identified by IR, so nephrostomy tube was deferred. Patient to follow-up with Urology as an outpatient. ? ?Psoas abscess, left ([place]) ?See problem, Perinephric abscess ? ?Perinephric abscess ?Large abscess measuring 7.6 x 6.7 x 4.4 cm. IR consulted for percutaneous drain placement. Urine/blood cultures obtained. Empiric meropenem initiated on admission. Percutaneous drain placed by IR on 4/14. Wound culture with abundant GNR. Urine and wound cultures significant for Citrobacter Koseri. Switched from meropenem to Ceftriaxone. Ceftriaxone was deescalated to Cefazolin after culture sensitivities were available and finally to cefadroxil for 14 days on discharge per ID recommendations. Patient to follow-up with IR and ID as an outpatient. ? ?ANEMIA, IRON DEFICIENCY, UNSPEC. ?Hemoglobin of 10.8 on admission. Downward drift with stabilization. ? ?Hyperlipidemia ?Patient is on atorvastatin as an outpatient. Continue home atorvastatin. ? ?HTN (hypertension) ?Patient is on chlorthalidone as an outpatient. Previously prescribed candesartan but listed as not taking. Blood pressure was soft on admission and candesartan and chlorthalidone were held. Blood pressure elevated so can resume home regimen on discharge. ? ?T2DM (type 2 diabetes mellitus) ([place]) ?Well controlled. Hemoglobin A1C of 6.7% from January 2023. Patient is on metformin as an outpatient. Continue on discharge. ? ?Hypomagnesemia-resolved as of 03/06/2022 ?Given magnesium supplementation. Resolved. ? ?Hypokalemia-resolved as of 03/06/2022 ?Given potassium supplementation. Resolved. ? ? ? ? ?  ? ?Consultants: Urology, Interventional Radiology, Infectious Disease ?Procedures performed: CT guided left perinephric drain placement (4/14)  ?Disposition: Home ?Diet recommendation:  ?Cardiac and Carb modified diet ? ?DISCHARGE MEDICATION: ?Allergies as of 03/09/2022   ? ?   Reactions  ? Amlodipine Swelling  ? Bactrim [sulfamethoxazole-trimethoprim] Nausea Only  ? Extreme nausea  and felt really bad  ? Carvedilol Diarrhea  ? ?  ? ?  ?Medication List  ?  ? ?STOP taking these medications   ? ?potassium  chloride SA 20 MEQ tablet ?Commonly known as: KLOR-CON M ?  ? ?  ? ?TAKE these medications   ? ?atorvastatin 40 MG tablet ?Commonly known as: LIPITOR ?Take 1 tablet (40 mg total) by mouth daily. ?  ?blood glucose meter kit and supplies Kit ?Dispense based on patient and insurance preference. Use up to four times daily as directed. (FOR ICD-9 250.00, 250.01). ?  ?Blood Pressure Cuff Misc ?Check blood pressure once daily at the same time and record readings ?  ?candesartan 32 MG tablet ?Commonly known as: ATACAND ?Take 1 tablet (32 mg total) by mouth daily. ?  ?cefadroxil 500 MG capsule ?Commonly known as: DURICEF ?Take 1 capsule (500 mg total) by mouth 2 (two) times daily for 14 days. ?  ?chlorthalidone 25 MG tablet ?Commonly known as: HYGROTON ?Take 1 tablet (25 mg total) by mouth daily. ?  ?gabapentin 300 MG capsule ?Commonly known as: NEURONTIN ?Take 1 capsule (300 mg total) by mouth 3 (three) times daily. ?What changed:  ?when to take this ?reasons to take this ?  ?glucose blood test strip ?Commonly known as: True Metrix Blood Glucose Test ?TEST UP TO FOUR TIMES DAILY ?  ?Levemir 100 UNIT/ML injection ?Generic drug: insulin detemir ?Inject 0.1 mLs (10 Units total) into the skin at bedtime. ?What changed:  ?when to take this ?reasons to take this ?  ?metFORMIN 1000 MG tablet ?Commonly known as: GLUCOPHAGE ?Take 1 tablet (1,000 mg total) by mouth 2 (two) times daily with a meal. ?  ?omeprazole 20 MG capsule ?Commonly known as: PRILOSEC ?Take 1 capsule (20 mg total) by mouth daily. ?What changed:  ?when to take this ?reasons to take this ?  ?ondansetron 4 MG disintegrating tablet ?Commonly known as: ZOFRAN-ODT ?Take 1 tablet (4 mg total) by mouth every 8 (eight) hours as needed for nausea. ?  ? ?  ? ?  ?  ? ? ?  ?Discharge Care Instructions  ?(From admission, onward)  ?  ? ? ?  ? ?  Start      Ordered  ? 03/09/22 0000  Discharge wound care:       ?Comments: Once daily flush of drain with 5 cc's saline, record output and change dressing every 2-3 days  ? 03/09/22 1448  ? ?  ?  ? ?  ? ? Follow-up Information   ? ? Bo Merino I, NP. Schedule an appointment as soon as possible for a visit in 1 week(s).   ?Specialty: Nurse Practitioner ?Why: For hospital follow-up ?Contact information: ?[address] ?[place] [address] ?[phone] ? ? ?  ?  ? ?  ?  ? ?  ? ?Discharge Exam: ? ?BP (!) 153/92   Pulse 90   Temp 97.6 ?F (36.4 ?C) (Oral)   Resp (!) 25   Ht '5\' 7"'  (1.702 m)   Wt 84.5 kg   SpO2 100%   BMI 29.18 kg/m?  ? ?General exam: Appears calm and comfortable  ?Respiratory system: Clear to auscultation. Respiratory effort normal. ?Cardiovascular system: S1 & S2 heard, RRR. No murmurs, rubs, gallops or clicks. ?Gastrointestinal system: Abdomen is nondistended, soft and mildly tender in left side/flank. Normal bowel sounds heard. ?Central nervous system: Alert and oriented. No focal neurological deficits. ?Musculoskeletal: No edema. No calf tenderness ?Skin: No cyanosis. No rashes ?Psychiatry: Judgement and insight appear normal. Mood & affect appropriate.  ? ?Condition at discharge: stable ? ?The results of significant diagnostics from this hospitalization (including imaging, microbiology, ancillary and laboratory)  are listed below for reference.  ? ?Imaging Studies: ?DG Chest 2 View ? ?Result Date: 03/04/2022 ?CLINICAL DATA:  Left back pain EXAM: CHEST - 2 VIEW COMPARISON:  01/25/2022 FINDINGS: The heart size and mediastinal contours are within normal limits. Both lungs are clear. The visualized skeletal structures are unremarkable. IMPRESSION: No active cardiopulmonary disease. Electronically Signed   By: Donavan Foil M.D.   On: 03/04/2022 20:56  ? ?CT ABDOMEN PELVIS W CONTRAST ? ?Result Date: 03/08/2022 ?CLINICAL DATA:  Inpatient. Follow-up psoas muscle abscess status post percutaneous  drainage. EXAM: CT ABDOMEN AND PELVIS WITH CONTRAST TECHNIQUE: Multidetector CT imaging of the abdomen and pelvis was performed using the standard protocol following bolus administration of intravenous contrast. RA

## 2022-03-09 NOTE — Progress Notes (Signed)
? ? ?Referring Physician(s): ?Newsome,G ? ?Supervising Physician: Michaelle Birks ? ?Patient Status:  [facility] ? ?Chief Complaint: ?Left flank pain/perinephric abscess ? ? ?Subjective: ?Pt doing fairly well; states she is going home today; denies worsening left flank pain,N/V ? ? ?Allergies: ?Amlodipine, Bactrim [sulfamethoxazole-trimethoprim], and Carvedilol ? ?Medications: ?Prior to Admission medications   ?Medication Sig Start Date End Date Taking? Authorizing Provider  ?atorvastatin (LIPITOR) 40 MG tablet Take 1 tablet (40 mg total) by mouth daily. 10/01/21 03/06/23 Yes Passmore, Jake Church I, NP  ?chlorthalidone (HYGROTON) 25 MG tablet Take 1 tablet (25 mg total) by mouth daily. 12/21/21 06/19/22 Yes Passmore, Jake Church I, NP  ?gabapentin (NEURONTIN) 300 MG capsule Take 1 capsule (300 mg total) by mouth 3 (three) times daily. ?Patient taking differently: Take 300 mg by mouth daily as needed (for pain). 10/01/21  Yes Passmore, Jake Church I, NP  ?insulin detemir (LEVEMIR) 100 UNIT/ML injection Inject 0.1 mLs (10 Units total) into the skin at bedtime. ?Patient taking differently: Inject 10 Units into the skin at bedtime as needed (for blood glucose greater than 240). 10/20/21 03/06/23 Yes Passmore, Jake Church I, NP  ?metFORMIN (GLUCOPHAGE) 1000 MG tablet Take 1 tablet (1,000 mg total) by mouth 2 (two) times daily with a meal. 10/01/21 03/06/23 Yes Passmore, Jake Church I, NP  ?omeprazole (PRILOSEC) 20 MG capsule Take 1 capsule (20 mg total) by mouth daily. ?Patient taking differently: Take 20 mg by mouth daily as needed (for heartburn). 10/19/21  Yes Passmore, Jake Church I, NP  ?ondansetron (ZOFRAN-ODT) 4 MG disintegrating tablet Take 1 tablet (4 mg total) by mouth every 8 (eight) hours as needed for nausea. 10/19/21  Yes Passmore, Jake Church I, NP  ?blood glucose meter kit and supplies KIT Dispense based on patient and insurance preference. Use up to four times daily as directed. (FOR ICD-9 250.00, 250.01). 08/29/19   Azzie Glatter, FNP   ?Blood Pressure Monitoring (BLOOD PRESSURE CUFF) MISC Check blood pressure once daily at the same time and record readings 03/07/17   Scot Jun, FNP  ?candesartan (ATACAND) 32 MG tablet Take 1 tablet (32 mg total) by mouth daily. ?Patient not taking: Reported on 03/05/2022 12/21/21 12/21/22  Bo Merino I, NP  ?glucose blood (TRUE METRIX BLOOD GLUCOSE TEST) test strip TEST UP TO FOUR TIMES DAILY 05/11/17   Scot Jun, FNP  ?potassium chloride SA (KLOR-CON M) 20 MEQ tablet Take 1 tablet (20 mEq total) by mouth 2 (two) times daily. ?Patient not taking: Reported on 03/05/2022 10/20/21 12/19/21  Bo Merino I, NP  ? ? ? ?Vital Signs: ?BP (!) 164/82 (BP Location: Right Arm)   Pulse 71   Temp 97.8 ?F (36.6 ?C) (Oral)   Resp 20   Ht 5' 7" (1.702 m)   Wt 186 lb 4.6 oz (84.5 kg)   SpO2 100%   BMI 29.18 kg/m?  ? ?Physical Exam awake/alert; left flank drain intact, site mildly tender to palpation, output 20 cc turbid, bloody fluid; drain flushed with minimal return ? ?Imaging: ?CT ABDOMEN PELVIS W CONTRAST ? ?Result Date: 03/08/2022 ?CLINICAL DATA:  Inpatient. Follow-up psoas muscle abscess status post percutaneous drainage. EXAM: CT ABDOMEN AND PELVIS WITH CONTRAST TECHNIQUE: Multidetector CT imaging of the abdomen and pelvis was performed using the standard protocol following bolus administration of intravenous contrast. RADIATION DOSE REDUCTION: This exam was performed according to the departmental dose-optimization program which includes automated exposure control, adjustment of the mA and/or kV according to patient size and/or use of iterative reconstruction technique. CONTRAST:  127m  OMNIPAQUE IOHEXOL 300 MG/ML  SOLN COMPARISON:  03/04/2022 CT abdomen/pelvis. FINDINGS: Lower chest: No significant pulmonary nodules or acute consolidative airspace disease. Hepatobiliary: Diffuse hepatic steatosis. Normal liver size. No definite liver surface irregularity. No liver masses. Normal gallbladder with  no radiopaque cholelithiasis. No biliary ductal dilatation. Pancreas: Normal, with no mass or duct dilation. Spleen: Normal size. No mass. Adrenals/Urinary Tract: Normal adrenals. Normal right kidney with no right hydronephrosis. Clustered distal left pelvic ureteral stones measuring up to 28 x 9 mm in total, not appreciably changed. Multiple large irregular lower left renal nonobstructing stones, largest 21 x 16 mm, unchanged. No overt left hydronephrosis. Mild urothelial wall thickening throughout the central left renal collecting system and left lumbar ureter, unchanged. No residual measurable posterior left perinephric/left psoas muscle abscess status post percutaneous drain placement with well-positioned distal drain pigtail tip posterior to the inferior left kidney, with residual surrounding extensive patchy fat stranding. Residual mild asymmetric thickening of the left psoas and quadratus lumborum muscles, improved. Normal bladder. Stomach/Bowel: Normal non-distended stomach. Normal caliber small bowel with no small bowel wall thickening. Normal appendix. Normal large bowel with no diverticulosis, large bowel wall thickening or pericolonic fat stranding. Vascular/Lymphatic: Normal caliber abdominal aorta. Patent portal, splenic, hepatic and renal veins. No pathologically enlarged lymph nodes in the abdomen or pelvis. Reproductive: Enlarged myomatous uterus with dominant 5.9 cm posterior right uterine body fibroid, unchanged. No adnexal masses. Other: No pneumoperitoneum, ascites or focal fluid collection. Musculoskeletal: No aggressive appearing focal osseous lesions. IMPRESSION: 1. No residual measurable posterior left perinephric/left psoas muscle abscess status post placement of well-positioned percutaneous drain. Residual extensive patchy posterior perinephric fat stranding and asymmetric thickening of the left psoas and quadratus lumborum muscles, improved. 2. Clustered distal left pelvic ureteral stones  measuring up to 28 x 9 mm in total, not appreciably changed. Multiple large irregular lower left renal nonobstructing stones, largest 21 x 16 mm, unchanged. No overt left hydronephrosis. 3. Diffuse hepatic steatosis. 4. Enlarged myomatous uterus. Electronically Signed   By: Ilona Sorrel M.D.   On: 03/08/2022 16:30  ? ?CT IMAGE GUIDED DRAINAGE BY PERCUTANEOUS CATHETER ? ?Result Date: 03/05/2022 ?INDICATION: 56 year old female with history of left nephrolithiasis presenting with perinephric abscess. EXAM: CT IMAGE GUIDED DRAINAGE BY PERCUTANEOUS CATHETER COMPARISON:  CT abdomen pelvis from 03/04/2022 MEDICATIONS: The patient is currently admitted to the hospital and receiving intravenous antibiotics. The antibiotics were administered within an appropriate time frame prior to the initiation of the procedure. ANESTHESIA/SEDATION: Moderate (conscious) sedation was employed during this procedure. A total of Versed 2 mg and Fentanyl 100 mcg was administered intravenously. Moderate Sedation Time: 16 minutes. The patient's level of consciousness and vital signs were monitored continuously by radiology nursing throughout the procedure under my direct supervision. CONTRAST:  None COMPLICATIONS: None immediate. PROCEDURE: Informed written consent was obtained from the patient after a discussion of the risks, benefits and alternatives to treatment. The patient was placed prone on the CT gantry and a pre procedural CT was performed re-demonstrating the known abscess/fluid collection within the left retroperitoneum. The procedure was planned. A timeout was performed prior to the initiation of the procedure. RADIATION DOSE REDUCTION: This exam was performed according to the departmental dose-optimization program which includes automated exposure control, adjustment of the mA and/or kV according to patient size and/or use of iterative reconstruction technique. The left flank was prepped and draped in the usual sterile fashion. The  overlying soft tissues were anesthetized with 1% lidocaine with epinephrine. Appropriate trajectory was planned with  the use of a 22 gauge spinal needle. An 18 gauge trocar needle was advanced into the abscess/f

## 2022-03-09 NOTE — Progress Notes (Signed)
Discharge instructions reviewed with patient and daughter. Demonstrated flushing of drain, emptying and recharging of drain for patient. Patient and daughter verbalized understanding.  ?

## 2022-03-09 NOTE — Consult Note (Signed)
?   ? ? ? ? ?[facility]   ? ?Date of Admission:  03/04/2022    ? ?Reason for Consult: perinephric abscess    ?Referring Provider: Lonny Prude ? ? ? ?Abx: ?4/17-c cefazolin ? ?4/13-4/17 ceftriaxone      ? ? ?Assessment: ?56 yo female with hx kidney stone admitted 4/13 with sepsis in setting left perinephric abscess  ? ? ?She had perc drainage placed by ir on 4/14 for a 7 cm complex abscess that abutt the left psoas muscle ? ?Both urine cx and perc drainage cx showed citrobacter koseri (none amp-c). Blood cx negative ? ?Repeat ct on 4/17 showed collapse of the abscess only residual perinephric stranding ? ?She improved significantly on ceftriaxone ? ?Urology seen and will perform lithotripsy/stone extraction outpatient. Her kidney appears to still be functioning without evidence of a xanthogranulomatous process yet. ? ? ? ?Plan: ?With good drainage/improvement, will plan another 2 weeks antibiotics ?Oral cefadroxil 500 mg bid would be reasonable ?Will see her in clinic around 2 weeks to see if further treatment needed (5/3 @ 330pm) ?F/u urology as arranged with them ?Discussed with primary team  ? ? ? ? ?------------------------------------------------ ?Active Problems: ?  ANEMIA, IRON DEFICIENCY, UNSPEC. ?  T2DM (type 2 diabetes mellitus) ([place]) ?  HTN (hypertension) ?  Hyperlipidemia ?  Perinephric abscess ?  Psoas abscess, left ([place]) ?  Ureteral calculus, left ? ? ? ?HPI: Kimberly Herman is a 56 y.o. female dm2, htn/hlp, HFpEF, hx kidney stones admitted for sepsis/pyelo found to have perinephric abscess ? ?She presented with a few weeks left flank pain ?No n/v ?Sx progressive ?She went to the ed on 4/13 and workup showed stone and perinephric abscess ? ?In the ed she was febrile to 103.2 but normal hemodynamics ?IR placed perc drain on 4/14 ?Both urine and perc drain cx grew citrobacter koseri ? ?There is plan for further urologic management with stone removal outpatient ? ?Her kidney appear to  still be functioning well ? ?She feels well today and ready to go home ? ?No rash, diarrhea, n/v ? ? ?Family History  ?Problem Relation Age of Onset  ? Cancer Mother   ? ? ?Social History  ? ?Tobacco Use  ? Smoking status: Never  ? Smokeless tobacco: Never  ?Vaping Use  ? Vaping Use: Never used  ?Substance Use Topics  ? Alcohol use: Not Currently  ? Drug use: No  ? ? ?Allergies  ?Allergen Reactions  ? Amlodipine Swelling  ? Bactrim [Sulfamethoxazole-Trimethoprim] Nausea Only  ?  Extreme nausea and felt really bad  ? Carvedilol Diarrhea  ? ? ?Review of Systems: ?ROS ?All Other ROS was negative, except mentioned above ? ? ?Past Medical History:  ?Diagnosis Date  ? Diabetes mellitus without complication ([place])   ? Hypertension   ? Menopause 2015  ? Vaginal polyp 10/2019  ? Vitamin D deficiency 08/2019  ? ? ? ? ? ?Scheduled Meds: ? atorvastatin  40 mg Oral Daily  ? chlorthalidone  25 mg Oral Daily  ? insulin aspart  0-15 Units Subcutaneous TID WC  ? senna-docusate  1 tablet Oral QHS  ? sodium chloride flush  5 mL Intracatheter Q8H  ? ?Continuous Infusions: ?  ceFAZolin (ANCEF) IV 2 g (03/09/22 1414)  ? ?PRN Meds:.acetaminophen, hydrALAZINE, HYDROmorphone (DILAUDID) injection, lip balm, ondansetron (ZOFRAN) IV, oxyCODONE, polyethylene glycol ? ? ?OBJECTIVE: ?Blood pressure (!) 153/92, pulse 90, temperature 97.6 ?F (36.4 ?C), temperature source Oral, resp. rate (!) 25,  height '5\' 7"'$  (1.702 m), weight 84.5 kg, SpO2 100 %. ? ?Physical Exam ?General/constitutional: no distress, pleasant ?HEENT: Normocephalic, PER, Conj Clear, EOMI, Oropharynx clear ?Neck supple ?CV: rrr no mrg ?Lungs: clear to auscultation, normal respiratory effort ?Abd: Soft, Nontender ?Ext: no edema ?Skin: No Rash ?Neuro: nonfocal ?MSK: no peripheral joint swelling/tenderness/warmth; back spines nontender ? ?Gu: drain in place left posterior flank with serosanguinous output; tender left flank on deep palpation ? ? ? ?Lab Results ?Lab Results  ?Component  Value Date  ? WBC 5.9 03/09/2022  ? HGB 10.1 (L) 03/09/2022  ? HCT 30.3 (L) 03/09/2022  ? MCV 85.4 03/09/2022  ? PLT 391 03/09/2022  ?  ?Lab Results  ?Component Value Date  ? CREATININE 0.93 03/09/2022  ? BUN 10 03/09/2022  ? NA 142 03/09/2022  ? K 3.8 03/09/2022  ? CL 105 03/09/2022  ? CO2 29 03/09/2022  ?  ?Lab Results  ?Component Value Date  ? ALT 14 03/09/2022  ? AST 20 03/09/2022  ? ALKPHOS 73 03/09/2022  ? BILITOT 0.5 03/09/2022  ?  ? ? ?Microbiology: ?Recent Results (from the past 240 hour(s))  ?Urine Culture     Status: Abnormal  ? Collection Time: 03/04/22 11:05 PM  ? Specimen: Urine, Clean Catch  ?Result Value Ref Range Status  ? Specimen Description   Final  ?  URINE, CLEAN CATCH ?Performed at [facility], [address], [place], [place] [address] ?  ? Special Requests   Final  ?  NONE ?Performed at [facility], [address] [place], [place] [address] ?  ? Culture >=100,000 COLONIES/mL CITROBACTER KOSERI (A)  Final  ? Report Status 03/07/2022 FINAL  Final  ? Organism ID, Bacteria CITROBACTER KOSERI (A)  Final  ?    Susceptibility  ? Citrobacter koseri - MIC*  ?  CEFAZOLIN <=4 SENSITIVE Sensitive   ?  CEFEPIME <=0.12 SENSITIVE Sensitive   ?  CEFTRIAXONE <=0.25 SENSITIVE Sensitive   ?  CIPROFLOXACIN 0.5 INTERMEDIATE Intermediate   ?  GENTAMICIN <=1 SENSITIVE Sensitive   ?  IMIPENEM <=0.25 SENSITIVE Sensitive   ?  NITROFURANTOIN 32 SENSITIVE Sensitive   ?  TRIMETH/SULFA <=20 SENSITIVE Sensitive   ?  PIP/TAZO 8 SENSITIVE Sensitive   ?  * >=100,000 COLONIES/mL CITROBACTER KOSERI  ?Blood culture (routine x 2)     Status: None (Preliminary result)  ? Collection Time: 03/04/22 11:14 PM  ? Specimen: BLOOD  ?Result Value Ref Range Status  ? Specimen Description   Final  ?  BLOOD RIGHT ANTECUBITAL ?Performed at [facility], [address] [place], [place] [address] ?  ? Special Requests   Final  ?  BOTTLES DRAWN AEROBIC AND ANAEROBIC Blood Culture adequate volume ?Performed  at [facility], [address], [place], [place] [address] ?  ? Culture   Final  ?  NO GROWTH 4 DAYS ?Performed at [facility], [place] [address], [place], [place] [address] ?  ? Report Status PENDING  Incomplete  ?Blood culture (routine x 2)     Status: None (Preliminary result)  ? Collection Time: 03/04/22 11:20 PM  ? Specimen: BLOOD  ?Result Value Ref Range Status  ? Specimen Description   Final  ?  BLOOD SITE NOT SPECIFIED ?Performed at [facility], [address] [place], [place] [address] ?  ? Special Requests   Final  ?  BOTTLES DRAWN AEROBIC AND ANAEROBIC  Blood Culture adequate volume ?Performed at [facility], [address] [place], [place] [address] ?  ? Culture   Final  ?  NO GROWTH 4 DAYS ?Performed at [facility], [place] [address] [place], [place] [address] ?  ? Report Status PENDING  Incomplete  ?MRSA Next Gen by PCR, Nasal     Status: None  ? Collection Time: 03/05/22  2:28 AM  ? Specimen: Nasal Mucosa; Nasal Swab  ?Result Value Ref Range Status  ? MRSA by PCR Next Gen NOT DETECTED NOT DETECTED Final  ?  Comment: (NOTE) ?The GeneXpert MRSA Assay (FDA approved for NASAL specimens only), ?is one component of a comprehensive MRSA colonization surveillance ?program. It is not intended to diagnose MRSA infection nor to guide ?or monitor treatment for MRSA infections. ?Test performance is not FDA approved in patients less than 2 years ?old. ?Performed at [facility], [address] ?[place], [place] [address] ?  ?Aerobic/Anaerobic Culture w Gram Stain (surgical/deep wound)     Status: None (Preliminary result)  ? Collection Time: 03/05/22 12:48 PM  ? Specimen: Abscess  ?Result Value Ref Range Status  ? Specimen Description   Final  ?  ABSCESS ?Performed at [facility], [place] [address], [place], [place] [address] ?  ? Special Requests   Final  ?  ABDOMEN ?Performed at [facility], [place] [address], [place], [place] [address] ?  ? Gram Stain   Final  ?  ABUNDANT WBC PRESENT, PREDOMINANTLY PMN ?ABUNDANT GRAM NEGATIVE RODS ?Performed at [facility], [place] [address] [place], [place] [address] ?  ? Cu

## 2022-03-09 NOTE — Discharge Instructions (Addendum)
[name], ? ?You were in the hospital with an abscess near your left kidney in addition to kidney stones blocking your ureter. You were treated with a drain for the abscess, in addition to antibiotics. You have improved significantly but will still require further medical management outside the hospital. Please follow-up with the radiologist, urologist and infectious disease physicians, in addition to your PCP. Please continue antibiotics as recommended. It was a pleasure taking care of you. ? ?Sincerely, ? ?Cordelia Poche, MD ? ? ?Please flush left flank drain with 5 cc sterile saline once daily.  Record output of drain.  Please change gauze dressing over site every 2 to 3 days.  Radiology service will contact you for follow-up in our clinic in 2 weeks.  Please call [phone] for any drain related questions. ?

## 2022-03-10 LAB — CULTURE, BLOOD (ROUTINE X 2)
Culture: NO GROWTH
Culture: NO GROWTH
Special Requests: ADEQUATE
Special Requests: ADEQUATE

## 2022-03-10 LAB — AEROBIC/ANAEROBIC CULTURE W GRAM STAIN (SURGICAL/DEEP WOUND)

## 2022-03-12 ENCOUNTER — Other Ambulatory Visit: Payer: Self-pay | Admitting: Internal Medicine

## 2022-03-12 DIAGNOSIS — N151 Renal and perinephric abscess: Secondary | ICD-10-CM

## 2022-03-17 ENCOUNTER — Ambulatory Visit (INDEPENDENT_AMBULATORY_CARE_PROVIDER_SITE_OTHER): Payer: BLUE CROSS/BLUE SHIELD | Admitting: Nurse Practitioner

## 2022-03-17 ENCOUNTER — Other Ambulatory Visit: Payer: Self-pay

## 2022-03-17 ENCOUNTER — Encounter: Payer: Self-pay | Admitting: Nurse Practitioner

## 2022-03-17 VITALS — BP 176/96 | HR 67 | Temp 97.9°F | Ht 67.0 in | Wt 176.2 lb

## 2022-03-17 DIAGNOSIS — I10 Essential (primary) hypertension: Secondary | ICD-10-CM | POA: Diagnosis not present

## 2022-03-17 DIAGNOSIS — E119 Type 2 diabetes mellitus without complications: Secondary | ICD-10-CM

## 2022-03-17 LAB — POCT GLYCOSYLATED HEMOGLOBIN (HGB A1C)
HbA1c POC (<> result, manual entry): 6.1 % (ref 4.0–5.6)
HbA1c, POC (controlled diabetic range): 6.1 % (ref 0.0–7.0)
HbA1c, POC (prediabetic range): 6.1 % (ref 5.7–6.4)
Hemoglobin A1C: 6.1 % — AB (ref 4.0–5.6)

## 2022-03-17 MED ORDER — CANDESARTAN CILEXETIL 32 MG PO TABS
32.0000 mg | ORAL_TABLET | Freq: Every day | ORAL | 11 refills | Status: DC
  Filled 2022-03-17: qty 30, 30d supply, fill #0
  Filled 2022-06-22: qty 30, 30d supply, fill #1

## 2022-03-17 NOTE — Progress Notes (Signed)
? ?[facility] ?[place], [place]  [address] ?Phone:  [phone]   Fax:  [phone] ?Subjective:  ? Patient ID: Kimberly Herman, female    DOB: [birth year], 56 y.o.   MRN: [id number] ? ?Chief Complaint  ?Patient presents with  ? Hospitalization Follow-up  ?  Patient is here today for her hospital follow up visit. Patient states that she was admitted to the hospital on 03/04/22 for Sepsis and kidney stones  ? ?HPI ?Kimberly Herman 56 y.o. female  has a past medical history of Diabetes mellitus without complication ([place]), Hypertension, Menopause (2015), Vaginal polyp (10/2019), and Vitamin D deficiency (08/2019). To the [facility] for hospital follow up.  ? ?Patient states that she went to the ED on 4/13 for worsening lower back pain and suspected UTI, after taking prescribed antibiotics from UC with only temporary improvement in symptoms. States that she fell severely ill when she arrived at the ED. Was admitted to the hospital for 4 days, discharged on 4/18 with wound drain to assist with management of abscess formation. Patient also has multiple kidney stones, which will require surgery, once infection has cleared. ? ?Since being discharged, patient states that symptoms have improved drastically. States that she has not been taking metformin and antihypertensives due to concern for interference with newly prescribed medications. Has upcoming follow up with urology, will discuss medications further that time and follow up with PCP. ? ?Denies any other concerns. Denies any fatigue, chest pain, shortness of breath, HA or dizziness. Denies any blurred vision, numbness or tingling. ? ?Past Medical History:  ?Diagnosis Date  ? Diabetes mellitus without complication ([place])   ? Hypertension   ? Menopause 2015  ? Vaginal polyp 10/2019  ? Vitamin D deficiency 08/2019  ? ? ?History reviewed. No pertinent surgical history. ? ?Family History  ?Problem Relation Age of Onset  ? Cancer Mother    ? ? ?Social History  ? ?Socioeconomic History  ? Marital status: Single  ?  Spouse name: Not on file  ? Number of children: Not on file  ? Years of education: Not on file  ? Highest education level: Not on file  ?Occupational History  ? Not on file  ?Tobacco Use  ? Smoking status: Never  ? Smokeless tobacco: Never  ?Vaping Use  ? Vaping Use: Never used  ?Substance and Sexual Activity  ? Alcohol use: Not Currently  ? Drug use: No  ? Sexual activity: Yes  ?Other Topics Concern  ? Not on file  ?Social History Narrative  ? Patient recently lost youngest son he was killed in a motor cycle accident at age 74. June 2018  ? ?Social Determinants of Health  ? ?Financial Resource Strain: Not on file  ?Food Insecurity: Not on file  ?Transportation Needs: Not on file  ?Physical Activity: Not on file  ?Stress: Not on file  ?Social Connections: Not on file  ?Intimate Partner Violence: Not on file  ? ? ?Outpatient Medications Prior to Visit  ?Medication Sig Dispense Refill  ? atorvastatin (LIPITOR) 40 MG tablet Take 1 tablet (40 mg total) by mouth daily. 30 tablet 2  ? blood glucose meter kit and supplies KIT Dispense based on patient and insurance preference. Use up to four times daily as directed. (FOR ICD-9 250.00, 250.01). 1 each 0  ? Blood Pressure Monitoring (BLOOD PRESSURE CUFF) MISC Check blood pressure once daily at the same time and record readings 1 each 0  ? cefadroxil (DURICEF)  500 MG capsule Take 1 capsule (500 mg total) by mouth 2 (two) times daily for 14 days. 28 capsule 0  ? chlorthalidone (HYGROTON) 25 MG tablet Take 1 tablet (25 mg total) by mouth daily. 30 tablet 5  ? gabapentin (NEURONTIN) 300 MG capsule Take 1 capsule (300 mg total) by mouth 3 (three) times daily. (Patient taking differently: Take 300 mg by mouth daily as needed (for pain).) 90 capsule 2  ? glucose blood (TRUE METRIX BLOOD GLUCOSE TEST) test strip TEST UP TO FOUR TIMES DAILY 100 each 0  ? insulin detemir (LEVEMIR) 100 UNIT/ML injection Inject  0.1 mLs (10 Units total) into the skin at bedtime. (Patient taking differently: Inject 10 Units into the skin at bedtime as needed (for blood glucose greater than 240).) 10 mL 2  ? metFORMIN (GLUCOPHAGE) 1000 MG tablet Take 1 tablet (1,000 mg total) by mouth 2 (two) times daily with a meal. 60 tablet 2  ? omeprazole (PRILOSEC) 20 MG capsule Take 1 capsule (20 mg total) by mouth daily. (Patient taking differently: Take 20 mg by mouth daily as needed (for heartburn).) 30 capsule 11  ? ondansetron (ZOFRAN-ODT) 4 MG disintegrating tablet Take 1 tablet (4 mg total) by mouth every 8 (eight) hours as needed for nausea. 10 tablet 0  ? candesartan (ATACAND) 32 MG tablet Take 1 tablet (32 mg total) by mouth daily. (Patient not taking: Reported on 03/05/2022) 30 tablet 11  ? ?No facility-administered medications prior to visit.  ? ? ?Allergies  ?Allergen Reactions  ? Amlodipine Swelling  ? Bactrim [Sulfamethoxazole-Trimethoprim] Nausea Only  ?  Extreme nausea and felt really bad  ? Carvedilol Diarrhea  ? ? ?Review of Systems  ?Constitutional:  Negative for chills, fever and malaise/fatigue.  ?Respiratory:  Negative for cough and shortness of breath.   ?Cardiovascular:  Negative for chest pain, palpitations and leg swelling.  ?Gastrointestinal:  Negative for abdominal pain, blood in stool, constipation, diarrhea, nausea and vomiting.  ?Genitourinary: Negative.   ?Musculoskeletal:  Positive for back pain.  ?Skin: Negative.   ?Neurological: Negative.   ?Psychiatric/Behavioral:  Negative for depression. The patient is not nervous/anxious.   ?All other systems reviewed and are negative. ? ?   ?Objective:  ?  ?Physical Exam ?Constitutional:   ?   General: She is not in acute distress. ?   Appearance: Normal appearance. She is normal weight.  ?HENT:  ?   Head: Normocephalic.  ?Cardiovascular:  ?   Rate and Rhythm: Normal rate and regular rhythm.  ?   Pulses: Normal pulses.  ?   Heart sounds: Normal heart sounds.  ?   Comments: No  obvious peripheral edema ?Pulmonary:  ?   Effort: Pulmonary effort is normal.  ?   Breath sounds: Normal breath sounds.  ?Musculoskeletal:     ?   General: No swelling, tenderness, deformity or signs of injury. Normal range of motion.  ?   Right lower leg: No edema.  ?   Left lower leg: No edema.  ?Skin: ?   General: Skin is warm and dry.  ?   Capillary Refill: Capillary refill takes less than 2 seconds.  ?Neurological:  ?   General: No focal deficit present.  ?   Mental Status: She is alert and oriented to person, place, and time.  ?Psychiatric:     ?   Mood and Affect: Mood normal.     ?   Behavior: Behavior normal.     ?   Thought Content: Thought  content normal.     ?   Judgment: Judgment normal.  ? ? ?BP (!) 176/96   Pulse 67   Temp 97.9 ?F (36.6 ?C)   Ht '5\' 7"'  (1.702 m)   Wt 176 lb 3.2 oz (79.9 kg)   SpO2 100%   BMI 27.60 kg/m?  ?Wt Readings from Last 3 Encounters:  ?03/17/22 176 lb 3.2 oz (79.9 kg)  ?03/04/22 186 lb 4.6 oz (84.5 kg)  ?01/25/22 186 lb 4.6 oz (84.5 kg)  ? ? ?Immunization History  ?Administered Date(s) Administered  ? PFIZER(Purple Top)SARS-COV-2 Vaccination 05/07/2020, 06/24/2020  ? ? ?Diabetic Foot Exam - Simple   ?No data filed ?  ? ? ?Lab Results  ?Component Value Date  ? TSH 3.880 08/29/2019  ? ?Lab Results  ?Component Value Date  ? WBC 5.9 03/09/2022  ? HGB 10.1 (L) 03/09/2022  ? HCT 30.3 (L) 03/09/2022  ? MCV 85.4 03/09/2022  ? PLT 391 03/09/2022  ? ?Lab Results  ?Component Value Date  ? NA 142 03/09/2022  ? K 3.8 03/09/2022  ? CO2 29 03/09/2022  ? GLUCOSE 146 (H) 03/09/2022  ? BUN 10 03/09/2022  ? CREATININE 0.93 03/09/2022  ? BILITOT 0.5 03/09/2022  ? ALKPHOS 73 03/09/2022  ? AST 20 03/09/2022  ? ALT 14 03/09/2022  ? PROT 7.3 03/09/2022  ? ALBUMIN 3.1 (L) 03/09/2022  ? CALCIUM 8.9 03/09/2022  ? ANIONGAP 8 03/09/2022  ? EGFR 46 (L) 10/19/2021  ? ?Lab Results  ?Component Value Date  ? CHOL 188 10/01/2021  ? CHOL 151 05/08/2020  ? CHOL 199 08/29/2019  ? ?Lab Results  ?Component Value  Date  ? HDL 40 10/01/2021  ? HDL 46 05/08/2020  ? HDL 43 08/29/2019  ? ?Lab Results  ?Component Value Date  ? LDLCALC 110 (H) 10/01/2021  ? [place] 78 05/08/2020  ? LDLCALC 128 (H) 08/29/2019  ? ?Lab Results  ?[place]

## 2022-03-17 NOTE — Patient Instructions (Signed)
You were seen today in the [facility] for hospital follow up.  Please follow up in 2 mths for reevaluation.  ?

## 2022-03-18 ENCOUNTER — Other Ambulatory Visit: Payer: Self-pay

## 2022-03-24 ENCOUNTER — Encounter: Payer: Self-pay | Admitting: Internal Medicine

## 2022-03-24 ENCOUNTER — Ambulatory Visit: Payer: BLUE CROSS/BLUE SHIELD | Admitting: Internal Medicine

## 2022-03-24 ENCOUNTER — Other Ambulatory Visit: Payer: Self-pay

## 2022-03-24 VITALS — BP 159/88 | HR 79 | Temp 97.8°F | Ht 66.0 in | Wt 180.0 lb

## 2022-03-24 DIAGNOSIS — N12 Tubulo-interstitial nephritis, not specified as acute or chronic: Secondary | ICD-10-CM

## 2022-03-24 DIAGNOSIS — N200 Calculus of kidney: Secondary | ICD-10-CM | POA: Diagnosis not present

## 2022-03-24 DIAGNOSIS — N151 Renal and perinephric abscess: Secondary | ICD-10-CM

## 2022-03-24 NOTE — Progress Notes (Signed)
?  ? ? ? ? ?[facility] ? ?Patient Active Problem List  ? Diagnosis Date Noted  ? Perinephric abscess 03/05/2022  ? Psoas abscess, left ([place]) 03/05/2022  ? Ureteral calculus, left 03/05/2022  ? Vaginal polyp 10/30/2019  ? Encounter for gynecological examination with Papanicolaou smear of cervix 10/30/2019  ? Elevated glucose 08/30/2019  ? Hemoglobin A1C between 7% and 9% indicating borderline diabetic control ([place]) 08/30/2019  ? Hyperlipidemia 12/27/2017  ? Shortness of breath 12/27/2017  ? T2DM (type 2 diabetes mellitus) ([place]) 03/07/2017  ? Hyperglycemia 03/07/2017  ? HTN (hypertension) 03/07/2017  ? ANEMIA, IRON DEFICIENCY, UNSPEC. 01/19/2007  ? ? ? ? ?Subjective:  ? ? Patient ID: LYLLA EIFLER, female    DOB: August 02, 1966, 56 y.o.   MRN: [id number] ? ?Cc: f/u perinephric abscess hospital admission ? ?HPI: ? ?GERLEAN CID is a 56 y.o. female hx kidnye stone recent 03/04/22 admission for sepsis found to have left pernephric abscess ? ?I saw her during admission ?Other w/u showed the abscess abutting left psoas muscle. 7 cm size complex abscess. S/p drainage by ir on 4/14 ? ?Repeat ct 4/17 showed collapse of abscess with residual stranding. She was placed on ceftriaxone with significant improvement. ? ?Both urine and perc drain cx grew citrobacter koseri, a non-amp c organism ? ?Urology also evaluated and there was no concern for chronic xanthogranulomatous process ? ?I changed abx to cefazolin inpatient and she was discharged on cefadroxil for another 2 weeks starting 4/18. ? ? ?She still has the percutaneous drain. The jp bulbs only fill up about maximal 10 cc a day but the last several days even less ? ? ?She saw urology today (different from the one she saw in the hospital) who plan to remove the stones in a couple weeks (5/18) ?Tomorrow she has IR appointment  ? ? ?She finished antibiotics yesterday ? ? ? ?Allergies  ?Allergen Reactions  ? Amlodipine Swelling  ? Bactrim  [Sulfamethoxazole-Trimethoprim] Nausea Only  ?  Extreme nausea and felt really bad  ? Carvedilol Diarrhea  ? ? ? ? ?Outpatient Medications Prior to Visit  ?Medication Sig Dispense Refill  ? atorvastatin (LIPITOR) 40 MG tablet Take 1 tablet (40 mg total) by mouth daily. 30 tablet 2  ? blood glucose meter kit and supplies KIT Dispense based on patient and insurance preference. Use up to four times daily as directed. (FOR ICD-9 250.00, 250.01). 1 each 0  ? Blood Pressure Monitoring (BLOOD PRESSURE CUFF) MISC Check blood pressure once daily at the same time and record readings 1 each 0  ? candesartan (ATACAND) 32 MG tablet Take 1 tablet (32 mg total) by mouth daily. (Patient not taking: Reported on 03/05/2022) 30 tablet 11  ? candesartan (ATACAND) 32 MG tablet Take 1 tablet (32 mg total) by mouth daily. 30 tablet 11  ? chlorthalidone (HYGROTON) 25 MG tablet Take 1 tablet (25 mg total) by mouth daily. 30 tablet 5  ? gabapentin (NEURONTIN) 300 MG capsule Take 1 capsule (300 mg total) by mouth 3 (three) times daily. (Patient taking differently: Take 300 mg by mouth daily as needed (for pain).) 90 capsule 2  ? glucose blood (TRUE METRIX BLOOD GLUCOSE TEST) test strip TEST UP TO FOUR TIMES DAILY 100 each 0  ? insulin detemir (LEVEMIR) 100 UNIT/ML injection Inject 0.1 mLs (10 Units total) into the skin at bedtime. (Patient taking differently: Inject 10 Units into the skin at bedtime as needed (for blood glucose greater than 240).)  10 mL 2  ? metFORMIN (GLUCOPHAGE) 1000 MG tablet Take 1 tablet (1,000 mg total) by mouth 2 (two) times daily with a meal. 60 tablet 2  ? omeprazole (PRILOSEC) 20 MG capsule Take 1 capsule (20 mg total) by mouth daily. (Patient taking differently: Take 20 mg by mouth daily as needed (for heartburn).) 30 capsule 11  ? ondansetron (ZOFRAN-ODT) 4 MG disintegrating tablet Take 1 tablet (4 mg total) by mouth every 8 (eight) hours as needed for nausea. 10 tablet 0  ? ?No facility-administered medications  prior to visit.  ? ? ? ?Social History  ? ?Socioeconomic History  ? Marital status: Single  ?  Spouse name: Not on file  ? Number of children: Not on file  ? Years of education: Not on file  ? Highest education level: Not on file  ?Occupational History  ? Not on file  ?Tobacco Use  ? Smoking status: Never  ? Smokeless tobacco: Never  ?Vaping Use  ? Vaping Use: Never used  ?Substance and Sexual Activity  ? Alcohol use: Not Currently  ? Drug use: No  ? Sexual activity: Yes  ?Other Topics Concern  ? Not on file  ?Social History Narrative  ? Patient recently lost youngest son he was killed in a motor cycle accident at age 44. June 2018  ? ?Social Determinants of Health  ? ?Financial Resource Strain: Not on file  ?Food Insecurity: Not on file  ?Transportation Needs: Not on file  ?Physical Activity: Not on file  ?Stress: Not on file  ?Social Connections: Not on file  ?Intimate Partner Violence: Not on file  ? ? ? ? ?Review of Systems ?   ?All other ros negative ? ?Objective:  ?  ?BP (!) 159/88   Pulse 79   Temp 97.8 ?F (36.6 ?C) (Temporal)   Ht 5' 6" (1.676 m)   Wt 180 lb (81.6 kg)   BMI 29.05 kg/m?  ?Nursing note and vital signs reviewed. ? ?Physical Exam ? ?   ?General/constitutional: no distress, pleasant ?HEENT: Normocephalic, PER, Conj Clear, EOMI, Oropharynx clear ?Neck supple ?CV: rrr no mrg ?Lungs: clear to auscultation, normal respiratory effort ?Abd: Soft, Nontender ?Ext: no edema ?Skin: No Rash ?Neuro: nonfocal ?MSK: no peripheral joint swelling/tenderness/warmth; back spines nontender ? ? ? ? ?Labs: ?Lab Results  ?Component Value Date  ? WBC 5.9 03/09/2022  ? HGB 10.1 (L) 03/09/2022  ? HCT 30.3 (L) 03/09/2022  ? MCV 85.4 03/09/2022  ? PLT 391 03/09/2022  ? ?Last metabolic panel ?Lab Results  ?Component Value Date  ? GLUCOSE 146 (H) 03/09/2022  ? NA 142 03/09/2022  ? K 3.8 03/09/2022  ? CL 105 03/09/2022  ? CO2 29 03/09/2022  ? BUN 10 03/09/2022  ? CREATININE 0.93 03/09/2022  ? GFRNONAA >60 03/09/2022  ?  CALCIUM 8.9 03/09/2022  ? PHOS 3.5 03/05/2022  ? PROT 7.3 03/09/2022  ? ALBUMIN 3.1 (L) 03/09/2022  ? LABGLOB 3.9 10/19/2021  ? AGRATIO 1.1 (L) 10/19/2021  ? BILITOT 0.5 03/09/2022  ? ALKPHOS 73 03/09/2022  ? AST 20 03/09/2022  ? ALT 14 03/09/2022  ? ANIONGAP 8 03/09/2022  ? ? ?Micro: ? ?Serology: ? ?Imaging: ?4/17 ct pelv with contrast ?1. No residual measurable posterior left perinephric/left psoas ?muscle abscess status post placement of well-positioned percutaneous ?drain. Residual extensive patchy posterior perinephric fat stranding ?and asymmetric thickening of the left psoas and quadratus lumborum ?muscles, improved. ?2. Clustered distal left pelvic ureteral stones measuring up to 28 x ?9 mm  in total, not appreciably changed. Multiple large irregular ?lower left renal nonobstructing stones, largest 21 x 16 mm, ?unchanged. No overt left hydronephrosis. ?3. Diffuse hepatic steatosis. ?4. Enlarged myomatous uterus. ? ?Assessment & Plan:  ? ?Problem List Items Addressed This Visit   ? ?  ? Other  ? Perinephric abscess  ? Relevant Orders  ? CBC (Completed)  ? Basic Metabolic Panel (BMET) (Completed)  ? C-reactive protein (Completed)  ? ?Other Visit Diagnoses   ? ? Kidney stone    -  Primary  ? Relevant Orders  ? CBC (Completed)  ? Basic Metabolic Panel (BMET) (Completed)  ? C-reactive protein (Completed)  ? Pyelonephritis      ? Relevant Orders  ? CBC (Completed)  ? Basic Metabolic Panel (BMET) (Completed)  ? C-reactive protein (Completed)  ? ?  ? ? ? ? ?No orders of the defined types were placed in this encounter. ? ? ? ?Clinically doing well ?Drain remaining in per her urologist as they plan to have her renal/ureteral stone removed soon ? ?I would advise to continue off abx for now. Periop antibiotics is fine for stone removal ? ? ?F/u as 5/17 or sooner if sign/sx of infection ? ? ?Follow-up: No follow-ups on file. ? ? ? ? ? ?Jabier Mutton, MD ?[facility] ?[facility] ?03/24/2022, 3:33 PM ? ?

## 2022-03-24 NOTE — Patient Instructions (Addendum)
Your infection appear treated ? ?Often a deep infection can recur and usually within the first several weeks off antibiotics ? ? ?Let's check blood test today and assess your renal function and inflammatory level ? ?As your urologist wants the drain to remain, make sure the radiologist do not remove it ? ? ?See me on 5/17 -- a video visit is fine ? ?If you have fever, chill, worsening flank pain, malaise, poor appetite please call us sooner than 5/17 ? ? ? ?Please send me a mychart message on what they found on the imaging tomorrow when you see the radiologist ?

## 2022-03-25 ENCOUNTER — Other Ambulatory Visit: Payer: BLUE CROSS/BLUE SHIELD

## 2022-03-25 LAB — CBC
HCT: 37.7 % (ref 35.0–45.0)
Hemoglobin: 12.5 g/dL (ref 11.7–15.5)
MCH: 29.8 pg (ref 27.0–33.0)
MCHC: 33.2 g/dL (ref 32.0–36.0)
MCV: 89.8 fL (ref 80.0–100.0)
MPV: 13.9 fL — ABNORMAL HIGH (ref 7.5–12.5)
Platelets: 258 10*3/uL (ref 140–400)
RBC: 4.2 10*6/uL (ref 3.80–5.10)
RDW: 15.3 % — ABNORMAL HIGH (ref 11.0–15.0)
WBC: 3.4 10*3/uL — ABNORMAL LOW (ref 3.8–10.8)

## 2022-03-25 LAB — BASIC METABOLIC PANEL
BUN/Creatinine Ratio: 12 (calc) (ref 6–22)
BUN: 13 mg/dL (ref 7–25)
CO2: 31 mmol/L (ref 20–32)
Calcium: 9.4 mg/dL (ref 8.6–10.4)
Chloride: 105 mmol/L (ref 98–110)
Creat: 1.05 mg/dL — ABNORMAL HIGH (ref 0.50–1.03)
Glucose, Bld: 212 mg/dL — ABNORMAL HIGH (ref 65–99)
Potassium: 4.1 mmol/L (ref 3.5–5.3)
Sodium: 142 mmol/L (ref 135–146)

## 2022-03-25 LAB — C-REACTIVE PROTEIN: CRP: 3.2 mg/L (ref <8.0)

## 2022-04-07 ENCOUNTER — Other Ambulatory Visit: Payer: Self-pay

## 2022-04-07 ENCOUNTER — Telehealth (INDEPENDENT_AMBULATORY_CARE_PROVIDER_SITE_OTHER): Payer: BLUE CROSS/BLUE SHIELD | Admitting: Internal Medicine

## 2022-04-07 ENCOUNTER — Encounter: Payer: Self-pay | Admitting: Internal Medicine

## 2022-04-07 ENCOUNTER — Telehealth: Payer: BLUE CROSS/BLUE SHIELD | Admitting: Internal Medicine

## 2022-04-07 DIAGNOSIS — N151 Renal and perinephric abscess: Secondary | ICD-10-CM | POA: Diagnosis not present

## 2022-04-07 DIAGNOSIS — N218 Other lower urinary tract calculus: Secondary | ICD-10-CM

## 2022-04-07 NOTE — Progress Notes (Signed)
?  ? ? ? ? ?[facility] ? ?Patient Active Problem List  ? Diagnosis Date Noted  ? Perinephric abscess 03/05/2022  ? Psoas abscess, left ([place]) 03/05/2022  ? Ureteral calculus, left 03/05/2022  ? Vaginal polyp 10/30/2019  ? Encounter for gynecological examination with Papanicolaou smear of cervix 10/30/2019  ? Elevated glucose 08/30/2019  ? Hemoglobin A1C between 7% and 9% indicating borderline diabetic control ([place]) 08/30/2019  ? Hyperlipidemia 12/27/2017  ? Shortness of breath 12/27/2017  ? T2DM (type 2 diabetes mellitus) ([place]) 03/07/2017  ? Hyperglycemia 03/07/2017  ? HTN (hypertension) 03/07/2017  ? ANEMIA, IRON DEFICIENCY, UNSPEC. 01/19/2007  ? ? ? ? ?Subjective:  ? ? Patient ID: Kimberly Herman, female    DOB: 1965/12/11, 56 y.o.   MRN: [id number] ? ?Cc: f/u perinephric abscess hospital admission ? ?HPI: ? ?Kimberly Herman is a 56 y.o. female hx kidnye stone recent 03/04/22 admission for sepsis found to have left pernephric abscess ? ?I saw her during admission ?Other w/u showed the abscess abutting left psoas muscle. 7 cm size complex abscess. S/p drainage by ir on 4/14 ? ?Repeat ct 4/17 showed collapse of abscess with residual stranding. She was placed on ceftriaxone with significant improvement. ? ?Both urine and perc drain cx grew citrobacter koseri, a non-amp c organism ? ?Urology also evaluated and there was no concern for chronic xanthogranulomatous process ? ?I changed abx to cefazolin inpatient and she was discharged on cefadroxil for another 2 weeks starting 4/18. ? ? ?She still has the percutaneous drain. The jp bulbs only fill up about maximal 10 cc a day but the last several days even less ? ? ?She saw urology today (different from the one she saw in the hospital) who plan to remove the stones in a couple weeks (5/18) ?Tomorrow she has IR appointment  ? ? ?She finished antibiotics yesterday ? ?------------ ?5/17 id clinic f/u ?I saw her a couple weeks ago. At that time we had  stopped antibiotics as the ct finding showed collapsed abscess. She was told by urology to keep in perinepric perc drain as they were planning to do stone extraction ? ?Patient is awaiting lithotripsy/stone extraction tomorrow ? ?She is doing well. No focal pain except point of entry of the drain. She did check in with urology office and was reassured it was ok.  ?No fever/chill ?Good appetite ? ? ?I verified that I was speaking with the correct person using two identifiers. ?Due to the COVID-19 Pandemic/patient's request, this service was provided via telemedicine using audio/visual media.   ?The patient was located at home. ?The provider was located in the office. ?The patient did consent to this visit and is aware of charges through their insurance as well as the limitations of evaluation and management by telemedicine. ?Other persons participating in this telemedicine service were none. ?Time spent on visit was greater than 20 minutes on media and in coordination of care ? ? ? ? ?Allergies  ?Allergen Reactions  ? Amlodipine Swelling  ? Bactrim [Sulfamethoxazole-Trimethoprim] Nausea Only  ?  Extreme nausea and felt really bad  ? Carvedilol Diarrhea  ? ? ? ? ?Outpatient Medications Prior to Visit  ?Medication Sig Dispense Refill  ? blood glucose meter kit and supplies KIT Dispense based on patient and insurance preference. Use up to four times daily as directed. (FOR ICD-9 250.00, 250.01). 1 each 0  ? Blood Pressure Monitoring (BLOOD PRESSURE CUFF) MISC Check blood pressure once daily at the  same time and record readings 1 each 0  ? gabapentin (NEURONTIN) 300 MG capsule Take 1 capsule (300 mg total) by mouth 3 (three) times daily. (Patient taking differently: Take 300 mg by mouth daily as needed (for pain).) 90 capsule 2  ? glucose blood (TRUE METRIX BLOOD GLUCOSE TEST) test strip TEST UP TO FOUR TIMES DAILY 100 each 0  ? insulin detemir (LEVEMIR) 100 UNIT/ML injection Inject 0.1 mLs (10 Units total) into the skin  at bedtime. (Patient taking differently: Inject 10 Units into the skin at bedtime as needed (for blood glucose greater than 240).) 10 mL 2  ? omeprazole (PRILOSEC) 20 MG capsule Take 1 capsule (20 mg total) by mouth daily. (Patient taking differently: Take 20 mg by mouth daily as needed (for heartburn).) 30 capsule 11  ? ondansetron (ZOFRAN-ODT) 4 MG disintegrating tablet Take 1 tablet (4 mg total) by mouth every 8 (eight) hours as needed for nausea. 10 tablet 0  ? atorvastatin (LIPITOR) 40 MG tablet Take 1 tablet (40 mg total) by mouth daily. (Patient not taking: Reported on 04/07/2022) 30 tablet 2  ? candesartan (ATACAND) 32 MG tablet Take 1 tablet (32 mg total) by mouth daily. (Patient not taking: Reported on 04/07/2022) 30 tablet 11  ? candesartan (ATACAND) 32 MG tablet Take 1 tablet (32 mg total) by mouth daily. 30 tablet 11  ? chlorthalidone (HYGROTON) 25 MG tablet Take 1 tablet (25 mg total) by mouth daily. (Patient not taking: Reported on 04/07/2022) 30 tablet 5  ? metFORMIN (GLUCOPHAGE) 1000 MG tablet Take 1 tablet (1,000 mg total) by mouth 2 (two) times daily with a meal. (Patient not taking: Reported on 04/07/2022) 60 tablet 2  ? ?No facility-administered medications prior to visit.  ? ? ? ?Social History  ? ?Socioeconomic History  ? Marital status: Single  ?  Spouse name: Not on file  ? Number of children: Not on file  ? Years of education: Not on file  ? Highest education level: Not on file  ?Occupational History  ? Not on file  ?Tobacco Use  ? Smoking status: Never  ? Smokeless tobacco: Never  ?Vaping Use  ? Vaping Use: Never used  ?Substance and Sexual Activity  ? Alcohol use: Not Currently  ? Drug use: No  ? Sexual activity: Yes  ?Other Topics Concern  ? Not on file  ?Social History Narrative  ? Patient recently lost youngest son he was killed in a motor cycle accident at age 54. June 2018  ? ?Social Determinants of Health  ? ?Financial Resource Strain: Not on file  ?Food Insecurity: Not on file   ?Transportation Needs: Not on file  ?Physical Activity: Not on file  ?Stress: Not on file  ?Social Connections: Not on file  ?Intimate Partner Violence: Not on file  ? ? ? ? ?Review of Systems ?   ?All other ros negative ? ?Objective:  ?  ?There were no vitals taken for this visit. ?Nursing note and vital signs reviewed. ? ?Physical Exam ? ?   ?Telephone visit ? ? ?Labs: ?Lab Results  ?Component Value Date  ? WBC 3.4 (L) 03/24/2022  ? HGB 12.5 03/24/2022  ? HCT 37.7 03/24/2022  ? MCV 89.8 03/24/2022  ? PLT 258 03/24/2022  ? ?Last metabolic panel ?Lab Results  ?Component Value Date  ? GLUCOSE 212 (H) 03/24/2022  ? NA 142 03/24/2022  ? K 4.1 03/24/2022  ? CL 105 03/24/2022  ? CO2 31 03/24/2022  ? BUN 13 03/24/2022  ?  CREATININE 1.05 (H) 03/24/2022  ? GFRNONAA >60 03/09/2022  ? CALCIUM 9.4 03/24/2022  ? PHOS 3.5 03/05/2022  ? PROT 7.3 03/09/2022  ? ALBUMIN 3.1 (L) 03/09/2022  ? LABGLOB 3.9 10/19/2021  ? AGRATIO 1.1 (L) 10/19/2021  ? BILITOT 0.5 03/09/2022  ? ALKPHOS 73 03/09/2022  ? AST 20 03/09/2022  ? ALT 14 03/09/2022  ? ANIONGAP 8 03/09/2022  ? ? ?Micro: ? ?Serology: ? ?Imaging: ?4/17 ct pelv with contrast ?1. No residual measurable posterior left perinephric/left psoas ?muscle abscess status post placement of well-positioned percutaneous ?drain. Residual extensive patchy posterior perinephric fat stranding ?and asymmetric thickening of the left psoas and quadratus lumborum ?muscles, improved. ?2. Clustered distal left pelvic ureteral stones measuring up to 28 x ?9 mm in total, not appreciably changed. Multiple large irregular ?lower left renal nonobstructing stones, largest 21 x 16 mm, ?unchanged. No overt left hydronephrosis. ?3. Diffuse hepatic steatosis. ?4. Enlarged myomatous uterus. ? ?Assessment & Plan:  ? ?Problem List Items Addressed This Visit   ? ?  ? Other  ? Perinephric abscess - Primary  ? ?Other Visit Diagnoses   ? ? Calculus of other lower urinary tract location      ? ?  ? ? ? ?Patient continues  to do well off abx ?Abscess perc drain remains in and hopefully will be removed tomorrow when urology is done extracting her stone ? ?At this time I think it is reasonable to "graduate" patient from id clinic.

## 2022-04-08 ENCOUNTER — Other Ambulatory Visit: Payer: Self-pay

## 2022-04-08 MED ORDER — TAMSULOSIN HCL 0.4 MG PO CAPS
ORAL_CAPSULE | ORAL | 0 refills | Status: DC
  Filled 2022-04-08: qty 14, 14d supply, fill #0

## 2022-04-08 MED ORDER — OXYBUTYNIN CHLORIDE 5 MG PO TABS
ORAL_TABLET | ORAL | 0 refills | Status: DC
  Filled 2022-04-08: qty 21, 7d supply, fill #0

## 2022-04-08 MED ORDER — TRAMADOL HCL 50 MG PO TABS
ORAL_TABLET | ORAL | 0 refills | Status: DC
  Filled 2022-04-08: qty 15, 5d supply, fill #0

## 2022-04-08 MED ORDER — CIPROFLOXACIN HCL 250 MG PO TABS
ORAL_TABLET | ORAL | 0 refills | Status: DC
  Filled 2022-04-08: qty 28, 14d supply, fill #0

## 2022-04-09 ENCOUNTER — Other Ambulatory Visit: Payer: Self-pay

## 2022-05-17 ENCOUNTER — Ambulatory Visit: Payer: BLUE CROSS/BLUE SHIELD | Admitting: Nurse Practitioner

## 2022-05-19 ENCOUNTER — Other Ambulatory Visit: Payer: Self-pay

## 2022-05-19 ENCOUNTER — Encounter: Payer: Self-pay | Admitting: Nurse Practitioner

## 2022-05-19 ENCOUNTER — Ambulatory Visit (INDEPENDENT_AMBULATORY_CARE_PROVIDER_SITE_OTHER): Payer: BLUE CROSS/BLUE SHIELD | Admitting: Nurse Practitioner

## 2022-05-19 VITALS — BP 168/88 | HR 69 | Temp 98.4°F | Ht 66.0 in | Wt 188.0 lb

## 2022-05-19 DIAGNOSIS — I10 Essential (primary) hypertension: Secondary | ICD-10-CM | POA: Diagnosis not present

## 2022-05-19 DIAGNOSIS — E119 Type 2 diabetes mellitus without complications: Secondary | ICD-10-CM | POA: Diagnosis not present

## 2022-05-19 LAB — POCT URINALYSIS DIP (CLINITEK)
Bilirubin, UA: NEGATIVE
Glucose, UA: 500 mg/dL — AB
Ketones, POC UA: NEGATIVE mg/dL
Nitrite, UA: POSITIVE — AB
POC PROTEIN,UA: 30 — AB
Spec Grav, UA: 1.015 (ref 1.010–1.025)
Urobilinogen, UA: 0.2 E.U./dL
pH, UA: 5.5 (ref 5.0–8.0)

## 2022-05-19 MED ORDER — METFORMIN HCL 1000 MG PO TABS
1000.0000 mg | ORAL_TABLET | Freq: Two times a day (BID) | ORAL | 2 refills | Status: DC
Start: 2022-05-19 — End: 2022-08-07
  Filled 2022-05-19: qty 60, 30d supply, fill #0

## 2022-05-19 MED ORDER — CHLORTHALIDONE 25 MG PO TABS
25.0000 mg | ORAL_TABLET | Freq: Every day | ORAL | 5 refills | Status: DC
  Filled 2022-05-19: qty 30, 30d supply, fill #0
  Filled 2022-06-22 – 2022-08-03 (×2): qty 30, 30d supply, fill #1

## 2022-05-19 MED ORDER — ATORVASTATIN CALCIUM 40 MG PO TABS
40.0000 mg | ORAL_TABLET | Freq: Every day | ORAL | 2 refills | Status: DC
  Filled 2022-05-19: qty 30, 30d supply, fill #0

## 2022-05-19 NOTE — Progress Notes (Unsigned)
'@Patient'  ID: Kimberly Herman, female    DOB: 1966-11-05, 56 y.o.   MRN: [id number]  Chief Complaint  Patient presents with   Follow-up    2 month F/U for HTN / DM.     Referring provider: Teena Dunk, NP  HPI  Kimberly Herman 56 y.o. female  has a past medical history of Diabetes mellitus without complication ([place]), Hypertension, Menopause (2015), Vaginal polyp (10/2019), and Vitamin D deficiency (08/2019). To the [facility] for hospital follow up.   Patient presents today for follow-up on diabetes and hypertension.  She was seen 2 months ago and had labs at that time.  Patient's blood pressure was elevated in the office today.  Patient took herself off of her medications except for candesartan because she has been having extensive renal procedures recently due to kidney stones.  We discussed the importance of taking medications as directed.  Patient's blood pressure is elevated in the office and we discussed that she was previously on chlorthalidone which can help bring down blood pressure as well.  We will reorder her medications today as she can double check with urology before starting them. Denies f/c/s, n/v/d, hemoptysis, PND, leg swelling Denies chest pain or edema       Allergies  Allergen Reactions   Amlodipine Swelling   Bactrim [Sulfamethoxazole-Trimethoprim] Nausea Only    Extreme nausea and felt really bad   Carvedilol Diarrhea    Immunization History  Administered Date(s) Administered   PFIZER(Purple Top)SARS-COV-2 Vaccination 05/07/2020, 06/24/2020    Past Medical History:  Diagnosis Date   Diabetes mellitus without complication ([place])    Hypertension    Menopause 2015   Vaginal polyp 10/2019   Vitamin D deficiency 08/2019    Tobacco History: Social History   Tobacco Use  Smoking Status Never  Smokeless Tobacco Never   Counseling given: Not Answered   Outpatient Encounter Medications as of 05/19/2022  Medication Sig   candesartan (ATACAND) 32 MG tablet  Take 1 tablet (32 mg total) by mouth daily.   glucose blood (TRUE METRIX BLOOD GLUCOSE TEST) test strip TEST UP TO FOUR TIMES DAILY   insulin detemir (LEVEMIR) 100 UNIT/ML injection Inject 0.1 mLs (10 Units total) into the skin at bedtime. (Patient taking differently: Inject 10 Units into the skin at bedtime as needed (for blood glucose greater than 240).)   atorvastatin (LIPITOR) 40 MG tablet Take 1 tablet (40 mg total) by mouth daily.   blood glucose meter kit and supplies KIT Dispense based on patient and insurance preference. Use up to four times daily as directed. (FOR ICD-9 250.00, 250.01).   Blood Pressure Monitoring (BLOOD PRESSURE CUFF) MISC Check blood pressure once daily at the same time and record readings   candesartan (ATACAND) 32 MG tablet Take 1 tablet (32 mg total) by mouth daily. (Patient not taking: Reported on 04/07/2022)   chlorthalidone (HYGROTON) 25 MG tablet Take 1 tablet (25 mg total) by mouth daily.   ciprofloxacin (CIPRO) 250 MG tablet Take 1 tablet (250 mg total) by mouth 2 times daily for 14 days. (Patient not taking: Reported on 05/19/2022)   gabapentin (NEURONTIN) 300 MG capsule Take 1 capsule (300 mg total) by mouth 3 (three) times daily. (Patient not taking: Reported on 05/19/2022)   metFORMIN (GLUCOPHAGE) 1000 MG tablet Take 1 tablet (1,000 mg total) by mouth 2 (two) times daily with a meal.   omeprazole (PRILOSEC) 20 MG capsule Take 1 capsule (20 mg total) by mouth daily. (Patient not taking: Reported  on 05/19/2022)   ondansetron (ZOFRAN-ODT) 4 MG disintegrating tablet Take 1 tablet (4 mg total) by mouth every 8 (eight) hours as needed for nausea. (Patient not taking: Reported on 05/19/2022)   oxybutynin (DITROPAN) 5 MG tablet Take 1 tablet (5 mg total) by mouth 3 (three)  times daily as needed for up to 7 days (bladder spasms). (Patient not taking: Reported on 05/19/2022)   tamsulosin (FLOMAX) 0.4 MG CAPS capsule Take 1 capsule (0.4 mg total) by mouth daily for 14 days.  (Patient not taking: Reported on 05/19/2022)   traMADol (ULTRAM) 50 MG tablet Take 1 tablet (50 mg total) by mouth every 6 (six) hours as needed for up to 5 days. (Patient not taking: Reported on 05/19/2022)   [DISCONTINUED] atorvastatin (LIPITOR) 40 MG tablet Take 1 tablet (40 mg total) by mouth daily. (Patient not taking: Reported on 04/07/2022)   [DISCONTINUED] chlorthalidone (HYGROTON) 25 MG tablet Take 1 tablet (25 mg total) by mouth daily. (Patient not taking: Reported on 04/07/2022)   [DISCONTINUED] metFORMIN (GLUCOPHAGE) 1000 MG tablet Take 1 tablet (1,000 mg total) by mouth 2 (two) times daily with a meal. (Patient not taking: Reported on 04/07/2022)   No facility-administered encounter medications on file as of 05/19/2022.     Review of Systems  Review of Systems  Constitutional: Negative.   HENT: Negative.    Cardiovascular: Negative.   Gastrointestinal: Negative.   Allergic/Immunologic: Negative.   Neurological: Negative.   Psychiatric/Behavioral: Negative.         Physical Exam  BP (!) 172/82 (BP Location: Left Arm, Patient Position: Sitting, Cuff Size: Normal)   Pulse 69   Temp 98.4 F (36.9 C)   Ht '5\' 6"'  (1.676 m)   Wt 188 lb (85.3 kg)   SpO2 100%   BMI 30.34 kg/m   Wt Readings from Last 5 Encounters:  05/19/22 188 lb (85.3 kg)  03/24/22 180 lb (81.6 kg)  03/17/22 176 lb 3.2 oz (79.9 kg)  03/04/22 186 lb 4.6 oz (84.5 kg)  01/25/22 186 lb 4.6 oz (84.5 kg)     Physical Exam Vitals and nursing note reviewed.  Constitutional:      General: She is not in acute distress.    Appearance: She is well-developed.  Cardiovascular:     Rate and Rhythm: Normal rate and regular rhythm.  Pulmonary:     Effort: Pulmonary effort is normal.     Breath sounds: Normal breath sounds.  Neurological:     Mental Status: She is alert and oriented to person, place, and time.  Psychiatric:        Mood and Affect: Mood normal.        Behavior: Behavior normal.      Lab  Results:  CBC    Component Value Date/Time   WBC 3.4 (L) 03/24/2022 1601   RBC 4.20 03/24/2022 1601   HGB 12.5 03/24/2022 1601   HGB 12.2 10/19/2021 1204   HCT 37.7 03/24/2022 1601   HCT 36.1 10/19/2021 1204   PLT 258 03/24/2022 1601   PLT 273 10/19/2021 1204   MCV 89.8 03/24/2022 1601   MCV 87 10/19/2021 1204   MCH 29.8 03/24/2022 1601   MCHC 33.2 03/24/2022 1601   RDW 15.3 (H) 03/24/2022 1601   RDW 12.3 10/19/2021 1204   LYMPHSABS 1.6 03/05/2022 0312   LYMPHSABS 1.3 10/19/2021 1204   MONOABS 1.0 03/05/2022 0312   EOSABS 0.0 03/05/2022 0312   EOSABS 0.1 10/19/2021 1204   BASOSABS 0.1 03/05/2022 1601  BASOSABS 0.0 10/19/2021 1204    BMET    Component Value Date/Time   NA 142 03/24/2022 1601   NA 139 10/19/2021 1204   K 4.1 03/24/2022 1601   CL 105 03/24/2022 1601   CO2 31 03/24/2022 1601   GLUCOSE 212 (H) 03/24/2022 1601   BUN 13 03/24/2022 1601   BUN 17 10/19/2021 1204   CREATININE 1.05 (H) 03/24/2022 1601   CALCIUM 9.4 03/24/2022 1601   GFRNONAA >60 03/09/2022 0434   GFRNONAA 63 03/04/2017 1543   GFRAA 64 01/02/2021 1650   GFRAA 72 03/04/2017 1543      Assessment & Plan:   T2DM (type 2 diabetes mellitus) ([place]) - metFORMIN (GLUCOPHAGE) 1000 MG tablet; Take 1 tablet (1,000 mg total) by mouth 2 (two) times daily with a meal.  Dispense: 60 tablet; Refill: 2 - atorvastatin (LIPITOR) 40 MG tablet; Take 1 tablet (40 mg total) by mouth daily.  Dispense: 30 tablet; Refill: 2 - Urine Culture - POCT URINALYSIS DIP (CLINITEK)  2. Essential hypertension  - chlorthalidone (HYGROTON) 25 MG tablet; Take 1 tablet (25 mg total) by mouth daily.  Dispense: 30 tablet; Refill: 5 - atorvastatin (LIPITOR) 40 MG tablet; Take 1 tablet (40 mg total) by mouth daily.  Dispense: 30 tablet; Refill: 2  Follow up:  Follow up in 3 months or sooner if needed     Fenton Foy, NP 05/19/2022

## 2022-05-19 NOTE — Patient Instructions (Addendum)
1. Type 2 diabetes mellitus without complication, without long-term current use of insulin (HCC)  - metFORMIN (GLUCOPHAGE) 1000 MG tablet; Take 1 tablet (1,000 mg total) by mouth 2 (two) times daily with a meal.  Dispense: 60 tablet; Refill: 2 - atorvastatin (LIPITOR) 40 MG tablet; Take 1 tablet (40 mg total) by mouth daily.  Dispense: 30 tablet; Refill: 2 - Urine Culture - POCT URINALYSIS DIP (CLINITEK)  2. Essential hypertension  - chlorthalidone (HYGROTON) 25 MG tablet; Take 1 tablet (25 mg total) by mouth daily.  Dispense: 30 tablet; Refill: 5 - atorvastatin (LIPITOR) 40 MG tablet; Take 1 tablet (40 mg total) by mouth daily.  Dispense: 30 tablet; Refill: 2  Follow up:  Follow up in 3 months or sooner if needed

## 2022-05-19 NOTE — Assessment & Plan Note (Signed)
-   metFORMIN (GLUCOPHAGE) 1000 MG tablet; Take 1 tablet (1,000 mg total) by mouth 2 (two) times daily with a meal.  Dispense: 60 tablet; Refill: 2 - atorvastatin (LIPITOR) 40 MG tablet; Take 1 tablet (40 mg total) by mouth daily.  Dispense: 30 tablet; Refill: 2 - Urine Culture - POCT URINALYSIS DIP (CLINITEK)  2. Essential hypertension  - chlorthalidone (HYGROTON) 25 MG tablet; Take 1 tablet (25 mg total) by mouth daily.  Dispense: 30 tablet; Refill: 5 - atorvastatin (LIPITOR) 40 MG tablet; Take 1 tablet (40 mg total) by mouth daily.  Dispense: 30 tablet; Refill: 2  Follow up:  Follow up in 3 months or sooner if needed

## 2022-05-20 ENCOUNTER — Other Ambulatory Visit: Payer: Self-pay

## 2022-05-24 LAB — URINE CULTURE

## 2022-05-26 ENCOUNTER — Other Ambulatory Visit: Payer: Self-pay

## 2022-05-26 ENCOUNTER — Other Ambulatory Visit: Payer: Self-pay | Admitting: Nurse Practitioner

## 2022-05-26 MED ORDER — AMOXICILLIN-POT CLAVULANATE 875-125 MG PO TABS: 1.0000 | ORAL_TABLET | Freq: Two times a day (BID) | ORAL | 0 refills | Status: AC

## 2022-05-31 ENCOUNTER — Other Ambulatory Visit (HOSPITAL_BASED_OUTPATIENT_CLINIC_OR_DEPARTMENT_OTHER): Payer: Self-pay

## 2022-06-21 ENCOUNTER — Ambulatory Visit: Payer: Self-pay | Admitting: Nurse Practitioner

## 2022-06-22 ENCOUNTER — Other Ambulatory Visit: Payer: Self-pay

## 2022-06-23 ENCOUNTER — Other Ambulatory Visit: Payer: Self-pay

## 2022-06-28 ENCOUNTER — Other Ambulatory Visit: Payer: Self-pay

## 2022-06-29 ENCOUNTER — Other Ambulatory Visit: Payer: Self-pay

## 2022-08-03 ENCOUNTER — Other Ambulatory Visit: Payer: Self-pay

## 2022-08-06 ENCOUNTER — Inpatient Hospital Stay (HOSPITAL_COMMUNITY)
Admission: EM | Admit: 2022-08-06 | Discharge: 2022-08-12 | DRG: 371 | Disposition: A | Discharge status: Home / Self Care | Payer: Self-pay | Attending: Internal Medicine | Admitting: Internal Medicine

## 2022-08-06 ENCOUNTER — Encounter (HOSPITAL_COMMUNITY): Payer: Self-pay | Admitting: Emergency Medicine

## 2022-08-06 ENCOUNTER — Emergency Department (HOSPITAL_COMMUNITY): Payer: Self-pay

## 2022-08-06 DIAGNOSIS — K6812 Psoas muscle abscess: Secondary | ICD-10-CM | POA: Diagnosis present

## 2022-08-06 DIAGNOSIS — Z79899 Other long term (current) drug therapy: Secondary | ICD-10-CM

## 2022-08-06 DIAGNOSIS — Z87442 Personal history of urinary calculi: Secondary | ICD-10-CM

## 2022-08-06 DIAGNOSIS — I10 Essential (primary) hypertension: Secondary | ICD-10-CM | POA: Diagnosis present

## 2022-08-06 DIAGNOSIS — N3090 Cystitis, unspecified without hematuria: Secondary | ICD-10-CM

## 2022-08-06 DIAGNOSIS — Z87448 Personal history of other diseases of urinary system: Secondary | ICD-10-CM

## 2022-08-06 DIAGNOSIS — K6819 Other retroperitoneal abscess: Principal | ICD-10-CM | POA: Diagnosis present

## 2022-08-06 DIAGNOSIS — Z888 Allergy status to other drugs, medicaments and biological substances status: Secondary | ICD-10-CM

## 2022-08-06 DIAGNOSIS — I5032 Chronic diastolic (congestive) heart failure: Secondary | ICD-10-CM | POA: Diagnosis present

## 2022-08-06 DIAGNOSIS — Z882 Allergy status to sulfonamides status: Secondary | ICD-10-CM

## 2022-08-06 DIAGNOSIS — I110 Hypertensive heart disease with heart failure: Secondary | ICD-10-CM | POA: Diagnosis present

## 2022-08-06 DIAGNOSIS — N151 Renal and perinephric abscess: Secondary | ICD-10-CM | POA: Diagnosis present

## 2022-08-06 DIAGNOSIS — Z905 Acquired absence of kidney: Secondary | ICD-10-CM

## 2022-08-06 DIAGNOSIS — Z7984 Long term (current) use of oral hypoglycemic drugs: Secondary | ICD-10-CM

## 2022-08-06 DIAGNOSIS — Z87440 Personal history of urinary (tract) infections: Secondary | ICD-10-CM

## 2022-08-06 DIAGNOSIS — E119 Type 2 diabetes mellitus without complications: Secondary | ICD-10-CM

## 2022-08-06 DIAGNOSIS — E785 Hyperlipidemia, unspecified: Secondary | ICD-10-CM | POA: Diagnosis present

## 2022-08-06 DIAGNOSIS — K760 Fatty (change of) liver, not elsewhere classified: Secondary | ICD-10-CM | POA: Diagnosis present

## 2022-08-06 DIAGNOSIS — Z794 Long term (current) use of insulin: Secondary | ICD-10-CM

## 2022-08-06 DIAGNOSIS — D259 Leiomyoma of uterus, unspecified: Secondary | ICD-10-CM | POA: Diagnosis present

## 2022-08-06 LAB — CBC WITH DIFFERENTIAL/PLATELET
Abs Immature Granulocytes: 0.03 10*3/uL (ref 0.00–0.07)
Basophils Absolute: 0 10*3/uL (ref 0.0–0.1)
Basophils Relative: 0 %
Eosinophils Absolute: 0.1 10*3/uL (ref 0.0–0.5)
Eosinophils Relative: 1 %
HCT: 30.5 % — ABNORMAL LOW (ref 36.0–46.0)
Hemoglobin: 10.3 g/dL — ABNORMAL LOW (ref 12.0–15.0)
Immature Granulocytes: 0 %
Lymphocytes Relative: 17 %
Lymphs Abs: 1.8 10*3/uL (ref 0.7–4.0)
MCH: 29 pg (ref 26.0–34.0)
MCHC: 33.8 g/dL (ref 30.0–36.0)
MCV: 85.9 fL (ref 80.0–100.0)
Monocytes Absolute: 0.6 10*3/uL (ref 0.1–1.0)
Monocytes Relative: 6 %
Neutro Abs: 7.7 10*3/uL (ref 1.7–7.7)
Neutrophils Relative %: 76 %
Platelets: 329 10*3/uL (ref 150–400)
RBC: 3.55 MIL/uL — ABNORMAL LOW (ref 3.87–5.11)
RDW: 12.9 % (ref 11.5–15.5)
WBC: 10.3 10*3/uL (ref 4.0–10.5)
nRBC: 0 % (ref 0.0–0.2)

## 2022-08-06 LAB — CBG MONITORING, ED: Glucose-Capillary: 153 mg/dL — ABNORMAL HIGH (ref 70–99)

## 2022-08-06 LAB — COMPREHENSIVE METABOLIC PANEL
ALT: 18 U/L (ref 0–44)
AST: 20 U/L (ref 15–41)
Albumin: 3.5 g/dL (ref 3.5–5.0)
Alkaline Phosphatase: 68 U/L (ref 38–126)
Anion gap: 8 (ref 5–15)
BUN: 15 mg/dL (ref 6–20)
CO2: 25 mmol/L (ref 22–32)
Calcium: 8.9 mg/dL (ref 8.9–10.3)
Chloride: 105 mmol/L (ref 98–111)
Creatinine, Ser: 1.07 mg/dL — ABNORMAL HIGH (ref 0.44–1.00)
GFR, Estimated: >60 mL/min (ref >60)
Glucose, Bld: 160 mg/dL — ABNORMAL HIGH (ref 70–99)
Potassium: 3.8 mmol/L (ref 3.5–5.1)
Sodium: 138 mmol/L (ref 135–145)
Total Bilirubin: 0.6 mg/dL (ref 0.3–1.2)
Total Protein: 8.4 g/dL — ABNORMAL HIGH (ref 6.5–8.1)

## 2022-08-06 LAB — LACTIC ACID, PLASMA: Lactic Acid, Venous: 0.8 mmol/L (ref 0.5–1.9)

## 2022-08-06 MED ORDER — ONDANSETRON 8 MG PO TBDP
8.0000 mg | ORAL_TABLET | Freq: Once | ORAL | Status: AC
  Administered 2022-08-06: 8 mg via ORAL
  Filled 2022-08-06: qty 1

## 2022-08-06 MED ORDER — HYDROMORPHONE HCL 1 MG/ML IJ SOLN
0.5000 mg | Freq: Once | INTRAMUSCULAR | Status: AC
  Administered 2022-08-07: 0.5 mg via INTRAVENOUS
  Filled 2022-08-06: qty 1

## 2022-08-06 MED ORDER — SODIUM CHLORIDE 0.9 % IV BOLUS
1000.0000 mL | Freq: Once | INTRAVENOUS | Status: AC
  Administered 2022-08-07: 1000 mL via INTRAVENOUS

## 2022-08-06 MED ORDER — HYDROCODONE-ACETAMINOPHEN 5-325 MG PO TABS
1.0000 | ORAL_TABLET | Freq: Once | ORAL | Status: AC
  Administered 2022-08-06: 1 via ORAL
  Filled 2022-08-06: qty 1

## 2022-08-06 NOTE — ED Notes (Signed)
Patient advised not to drive after taking pain medication. Patient verbalizes understanding.

## 2022-08-06 NOTE — ED Provider Notes (Signed)
[facility] DEPT Provider Note   CSN: [id number] Arrival date & time: 08/06/22  2105     History {Add pertinent medical, surgical, social history, OB history to HPI:1} Chief Complaint  Patient presents with   Flank Pain    Kimberly Herman is a 56 y.o. female, hx of CKD, infected kidney stones, who presents to the ED secondary to right flank pain for last 2 days, that has been persistent, and worse with movement.  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  She denies any diarrhea, vomiting.  Does endorse having some nausea.  Did take a Norco in the waiting room with little relief of her pain. Denies urinary frequency, urgency, and dysuria.    Flank Pain       Home Medications Prior to Admission medications   Medication Sig Start Date End Date Taking? Authorizing Provider  atorvastatin (LIPITOR) 40 MG tablet Take 1 tablet (40 mg total) by mouth daily. 05/19/22 08/17/22  Fenton Foy, NP  blood glucose meter kit and supplies KIT Dispense based on patient and insurance preference. Use up to four times daily as directed. (FOR ICD-9 250.00, 250.01). 08/29/19   Azzie Glatter, FNP  Blood Pressure Monitoring (BLOOD PRESSURE CUFF) MISC Check blood pressure once daily at the same time and record readings 03/07/17   Scot Jun, FNP  candesartan (ATACAND) 32 MG tablet Take 1 tablet (32 mg total) by mouth daily. Patient not taking: Reported on 04/07/2022 12/21/21 12/21/22  Bo Merino I, NP  candesartan (ATACAND) 32 MG tablet Take 1 tablet (32 mg total) by mouth daily. 03/17/22 03/17/23  Bo Merino I, NP  chlorthalidone (HYGROTON) 25 MG tablet Take 1 tablet (25 mg total) by mouth daily. 05/19/22 11/15/22  Fenton Foy, NP  ciprofloxacin (CIPRO) 250 MG tablet Take 1 tablet  (250 mg total) by mouth 2 times daily for 14 days. Patient not taking: Reported on 05/19/2022 04/08/22     gabapentin (NEURONTIN) 300 MG capsule Take 1 capsule (300 mg total) by mouth 3 (three) times daily. Patient not taking: Reported on 05/19/2022 10/01/21   Bo Merino I, NP  glucose blood (TRUE METRIX BLOOD GLUCOSE TEST) test strip TEST UP TO FOUR TIMES DAILY 05/11/17   Scot Jun, FNP  insulin detemir (LEVEMIR) 100 UNIT/ML injection Inject 0.1 mLs (10 Units total) into the skin at bedtime. Patient taking differently: Inject 10 Units into the skin at bedtime as needed (for blood glucose greater than 240). 10/20/21 01/18/22  Bo Merino I, NP  metFORMIN (GLUCOPHAGE) 1000 MG tablet Take 1 tablet (1,000 mg total) by mouth 2 (two) times daily with a meal. 05/19/22 08/17/22  Fenton Foy, NP  omeprazole (PRILOSEC) 20 MG capsule Take 1 capsule (20 mg total) by mouth daily. Patient not taking: Reported on 05/19/2022 10/19/21   Bo Merino I, NP  ondansetron (ZOFRAN-ODT) 4 MG disintegrating tablet Take 1 tablet (4 mg total) by mouth every 8 (eight) hours as needed for nausea. Patient not taking: Reported on 05/19/2022 10/19/21   Bo Merino I, NP  tamsulosin (FLOMAX) 0.4 MG CAPS capsule Take 1 capsule (0.4 mg total) by mouth daily for 14 days. Patient not taking: Reported on 05/19/2022 04/08/22     traMADol (ULTRAM) 50 MG tablet Take 1 tablet (50  mg total) by mouth every 6 (six) hours as needed for up to 5 days. Patient not taking: Reported on 05/19/2022 04/08/22         Allergies    Amlodipine, Bactrim [sulfamethoxazole-trimethoprim], and Carvedilol    Review of Systems   Review of Systems  Constitutional:  Positive for chills and fever.  Genitourinary:  Positive for flank pain.    Physical Exam Updated Vital Signs BP (!) 160/80 (BP Location: Right Arm)   Pulse (!) 102   Temp 100 F (37.8 C) (Oral)   Resp 16   Ht '5\' 6"'  (1.676 m)   Wt 81.6 kg   SpO2 100%   BMI  29.05 kg/m  Physical Exam Vitals and nursing note reviewed.  Constitutional:      Appearance: Normal appearance.  HENT:     Head: Normocephalic and atraumatic.     Right Ear: Tympanic membrane normal.     Left Ear: Tympanic membrane normal.     Nose: Nose normal.     Mouth/Throat:     Mouth: Mucous membranes are moist.  Eyes:     Extraocular Movements: Extraocular movements intact.     Conjunctiva/sclera: Conjunctivae normal.     Pupils: Pupils are equal, round, and reactive to light.  Cardiovascular:     Rate and Rhythm: Normal rate and regular rhythm.  Pulmonary:     Effort: Pulmonary effort is normal.     Breath sounds: Normal breath sounds.  Abdominal:     General: Abdomen is flat. Bowel sounds are normal.     Palpations: Abdomen is soft.     Tenderness: There is right CVA tenderness and left CVA tenderness.     Comments: +bilateral flank pain, w/nephrectomy scar noted on left flank  Musculoskeletal:        General: Normal range of motion.     Cervical back: Normal range of motion and neck supple.  Skin:    General: Skin is warm and moist.     Capillary Refill: Capillary refill takes less than 2 seconds.  Neurological:     General: No focal deficit present.     Mental Status: She is alert.  Psychiatric:        Mood and Affect: Mood normal.        Thought Content: Thought content normal.     ED Results / Procedures / Treatments   Labs (all labs ordered are listed, but only abnormal results are displayed) Labs Reviewed  COMPREHENSIVE METABOLIC PANEL - Abnormal; Notable for the following components:      Result Value   Glucose, Bld 160 (*)    Creatinine, Ser 1.07 (*)    Total Protein 8.4 (*)    All other components within normal limits  CBC WITH DIFFERENTIAL/PLATELET - Abnormal; Notable for the following components:   RBC 3.55 (*)    Hemoglobin 10.3 (*)    HCT 30.5 (*)    All other components within normal limits  CBG MONITORING, ED - Abnormal; Notable for  the following components:   Glucose-Capillary 153 (*)    All other components within normal limits  LACTIC ACID, PLASMA  URINALYSIS, ROUTINE W REFLEX MICROSCOPIC  LACTIC ACID, PLASMA    EKG None  Radiology CT Renal Stone Study  Result Date: 08/06/2022 CLINICAL DATA:  Right flank pain for 2 days with fever. History of left nephrectomy. EXAM: CT ABDOMEN AND PELVIS WITHOUT CONTRAST TECHNIQUE: Multidetector CT imaging of the abdomen and pelvis was performed following the standard protocol  without IV contrast. RADIATION DOSE REDUCTION: This exam was performed according to the departmental dose-optimization program which includes automated exposure control, adjustment of the mA and/or kV according to patient size and/or use of iterative reconstruction technique. COMPARISON:  03/08/2022. FINDINGS: Lower chest: No acute abnormality. Hepatobiliary: No focal liver abnormality is seen. Hepatic steatosis is noted. No gallstones, gallbladder wall thickening, or biliary dilatation. Pancreas: Unremarkable. No pancreatic ductal dilatation or surrounding inflammatory changes. Spleen: Normal in size without focal abnormality. Adrenals/Urinary Tract: The adrenal glands are within normal limits. The left kidney is surgically absent with fat stranding and soft tissue thickening in the left renal fossa extending into the iliopsoas muscle and pelvis on the left. There is a vague hypodense region in the iliopsoas and paraspinal soft tissues on the left measuring 4.0 x 1.7 x 6.9 cm. Examination is limited due to lack of IV contrast. There is asymmetric enlargement of the iliacus muscle on the left, not significantly changed from the prior exam. No intramuscular hematoma. No renal calculus or hydronephrosis on the right. Bladder is unremarkable. Stomach/Bowel: Stomach is within normal limits. Appendix appears normal. No evidence of bowel wall thickening, distention, or inflammatory changes. No free air or pneumatosis. A few  scattered diverticula are present along the colon without evidence of diverticulitis. Vascular/Lymphatic: Aortic atherosclerosis. Prominent lymph nodes are noted in the periaortic space in the retroperitoneum on the left which may be reactive. Reproductive: The uterus is enlarged and contains multiple fibroids. No adnexal mass. Other: No abdominopelvic ascites. Musculoskeletal: Degenerative changes are present in the thoracolumbar spine. No acute osseous abnormality. IMPRESSION: 1. No renal calculus or hydronephrosis on the right. 2. Status post left nephrectomy with fat stranding in the left renal fossa extending into the retroperitoneum and pelvis on the left. There is a vague hypodense region in the iliopsoas muscle on the left measuring 4.0 x 1.7 x 6.9 cm, increased in size from the previous exam and may represent abscess. Evaluation is limited due to lack of IV contrast. 3. Hepatic steatosis. 4. Enlarged fibroid uterus. 5. Aortic atherosclerosis. Electronically Signed   By: Brett Fairy M.D.   On: 08/06/2022 23:23    Procedures Procedures  {Document cardiac monitor, telemetry assessment procedure when appropriate:1}  Medications Ordered in ED Medications  HYDROcodone-acetaminophen (NORCO/VICODIN) 5-325 MG per tablet 1 tablet (1 tablet Oral Given 08/06/22 2159)  ondansetron (ZOFRAN-ODT) disintegrating tablet 8 mg (8 mg Oral Given 08/06/22 2159)    ED Course/ Medical Decision Making/ A&P                           Medical Decision Making Amount and/or Complexity of Data Reviewed Labs: ordered.  Risk Prescription drug management.   ***  {Document critical care time when appropriate:1} {Document review of labs and clinical decision tools ie heart score, Chads2Vasc2 etc:1}  {Document your independent review of radiology images, and any outside records:1} {Document your discussion with family members, caretakers, and with consultants:1} {Document social determinants of health affecting pt's  care:1} {Document your decision making why or why not admission, treatments were needed:1} Final Clinical Impression(s) / ED Diagnoses Final diagnoses:  None    Rx / DC Orders ED Discharge Orders     None

## 2022-08-06 NOTE — ED Triage Notes (Addendum)
Patient c/o R flank pain x2 days with fever. Reports L nephrectomy in August.

## 2022-08-06 NOTE — ED Provider Triage Note (Signed)
Emergency Medicine Provider Triage Evaluation Note  Kimberly Herman , a 56 y.o. female  was evaluated in triage.  Pt complains of right flank pain.  Has history of stones including infected stones.  She is status post left nephrectomy as of last month.  She denies dysuria, hematuria.  Temperature in triage of 100.0.  Endorses mild nausea but no vomiting.  Review of Systems  Positive: As above Negative: As above  Physical Exam  BP (!) 160/80 (BP Location: Right Arm)   Pulse (!) 102   Temp 100 F (37.8 C) (Oral)   Resp 16   Ht '5\' 6"'$  (1.676 m)   Wt 81.6 kg   SpO2 100%   BMI 29.05 kg/m  Gen:   Awake, no distress   Resp:  Normal effort  MSK:   Moves extremities without difficulty  Other:    Medical Decision Making  Medically screening exam initiated at 9:26 PM.  Appropriate orders placed.  Kimberly Herman was informed that the remainder of the evaluation will be completed by another provider, this initial triage assessment does not replace that evaluation, and the importance of remaining in the ED until their evaluation is complete.     Evlyn Courier, PA-C 08/06/22 2126

## 2022-08-07 ENCOUNTER — Inpatient Hospital Stay (HOSPITAL_COMMUNITY): Payer: Self-pay

## 2022-08-07 ENCOUNTER — Encounter (HOSPITAL_COMMUNITY): Payer: Self-pay | Admitting: Internal Medicine

## 2022-08-07 ENCOUNTER — Other Ambulatory Visit: Payer: Self-pay

## 2022-08-07 DIAGNOSIS — N151 Renal and perinephric abscess: Secondary | ICD-10-CM

## 2022-08-07 LAB — URINALYSIS, ROUTINE W REFLEX MICROSCOPIC
Bilirubin Urine: NEGATIVE
Glucose, UA: NEGATIVE mg/dL
Hgb urine dipstick: NEGATIVE
Ketones, ur: NEGATIVE mg/dL
Nitrite: POSITIVE — AB
Protein, ur: NEGATIVE mg/dL
Specific Gravity, Urine: 1.014 (ref 1.005–1.030)
WBC, UA: >50 WBC/hpf — ABNORMAL HIGH (ref 0–5)
pH: 5 (ref 5.0–8.0)

## 2022-08-07 LAB — GLUCOSE, CAPILLARY
Glucose-Capillary: 109 mg/dL — ABNORMAL HIGH (ref 70–99)
Glucose-Capillary: 194 mg/dL — ABNORMAL HIGH (ref 70–99)

## 2022-08-07 LAB — LACTIC ACID, PLASMA: Lactic Acid, Venous: 1 mmol/L (ref 0.5–1.9)

## 2022-08-07 MED ORDER — MORPHINE SULFATE (PF) 2 MG/ML IV SOLN
2.0000 mg | INTRAVENOUS | Status: DC | PRN
  Administered 2022-08-08: 2 mg via INTRAVENOUS
  Filled 2022-08-07: qty 1

## 2022-08-07 MED ORDER — ONDANSETRON HCL 4 MG/2ML IJ SOLN
4.0000 mg | Freq: Four times a day (QID) | INTRAMUSCULAR | Status: DC | PRN
  Administered 2022-08-08: 4 mg via INTRAVENOUS
  Filled 2022-08-07: qty 2

## 2022-08-07 MED ORDER — HEPARIN SODIUM (PORCINE) 5000 UNIT/ML IJ SOLN
5000.0000 [IU] | Freq: Three times a day (TID) | INTRAMUSCULAR | Status: DC
  Administered 2022-08-07 – 2022-08-08 (×4): 5000 [IU] via SUBCUTANEOUS
  Filled 2022-08-07 (×4): qty 1

## 2022-08-07 MED ORDER — INSULIN ASPART 100 UNIT/ML IJ SOLN
0.0000 [IU] | Freq: Three times a day (TID) | INTRAMUSCULAR | Status: DC
  Administered 2022-08-08 – 2022-08-11 (×6): 1 [IU] via SUBCUTANEOUS
  Administered 2022-08-12 (×2): 2 [IU] via SUBCUTANEOUS

## 2022-08-07 MED ORDER — ACETAMINOPHEN 325 MG PO TABS
650.0000 mg | ORAL_TABLET | Freq: Four times a day (QID) | ORAL | Status: DC | PRN
  Administered 2022-08-07 – 2022-08-08 (×3): 650 mg via ORAL
  Filled 2022-08-07 (×3): qty 2

## 2022-08-07 MED ORDER — METRONIDAZOLE 500 MG/100ML IV SOLN
500.0000 mg | Freq: Once | INTRAVENOUS | Status: AC
  Administered 2022-08-07: 500 mg via INTRAVENOUS
  Filled 2022-08-07: qty 100

## 2022-08-07 MED ORDER — ONDANSETRON HCL 4 MG PO TABS: 4.0000 mg | ORAL_TABLET | Freq: Four times a day (QID) | ORAL | Status: DC | PRN

## 2022-08-07 MED ORDER — SODIUM CHLORIDE 0.9 % IV SOLN
2.0000 g | Freq: Once | INTRAVENOUS | Status: AC
  Administered 2022-08-07: 2 g via INTRAVENOUS
  Filled 2022-08-07: qty 12.5

## 2022-08-07 MED ORDER — OXYCODONE HCL 5 MG PO TABS: 5.0000 mg | ORAL_TABLET | ORAL | Status: DC | PRN

## 2022-08-07 MED ORDER — INSULIN ASPART 100 UNIT/ML IJ SOLN: 0.0000 [IU] | Freq: Every day | INTRAMUSCULAR | Status: DC

## 2022-08-07 MED ORDER — SODIUM CHLORIDE 0.9 % IV SOLN
2.0000 g | Freq: Three times a day (TID) | INTRAVENOUS | Status: DC
  Administered 2022-08-07 – 2022-08-12 (×16): 2 g via INTRAVENOUS
  Filled 2022-08-07 (×16): qty 12.5

## 2022-08-07 MED ORDER — LACTATED RINGERS IV SOLN: INTRAVENOUS | Status: DC

## 2022-08-07 MED ORDER — ACETAMINOPHEN 650 MG RE SUPP: 650.0000 mg | Freq: Four times a day (QID) | RECTAL | Status: DC | PRN

## 2022-08-07 MED ORDER — LACTATED RINGERS IV SOLN: INTRAVENOUS | Status: AC

## 2022-08-07 NOTE — Progress Notes (Signed)
Request to IR for possible left flank abscess aspiration/drain placement. Patient imaging reviewed by Dr. Kathlene Cote who states the fluid is high density by unenhanced CT and looks like mostly hemorrhage, not abscess. No real low density fluid on CT. If white count goes up or fever does not resolve w/ abx, she will need a follow up contrast enhanced CT to look for more liquefied area for possible drainage.  Dr. Sloan Leiter aware of above. IR will follow along and reassess for drainage when appropriate.  Please call on call IR MD with questions or concerns.  Candiss Norse, PA-C

## 2022-08-07 NOTE — Progress Notes (Signed)
A consult was received from an ED provider for Cefepime per pharmacy dosing.  The patient's profile has been reviewed for ht/wt/allergies/indication/available labs.    A one time order has been placed for Cefepime 2g IV.  Further antibiotics/pharmacy consults should be ordered by admitting physician if indicated.                       Thank you, Luiz Ochoa 08/07/2022  12:47 AM

## 2022-08-07 NOTE — ED Notes (Signed)
Dr. Sloan Leiter at bedside, stating that pt does not need telemetry. He intends on changing pt to med surg and gave okay to transport without telemetry wires.

## 2022-08-07 NOTE — Plan of Care (Signed)
Discussed with patient plan of care for the evening, pain management and tylenol around midnight to keep temperature down with some teach back displayed.  Problem: Education: Goal: Knowledge of General Education information will improve Description: Including pain rating scale, medication(s)/side effects and non-pharmacologic comfort measures Outcome: Progressing

## 2022-08-07 NOTE — H&P (Signed)
History and Physical    Kimberly Herman QJJ:[id number] DOB: October 20, 1966 DOA: 08/06/2022  PCP: Bo Merino I, NP  Patient coming from: Home  I have personally briefly reviewed patient's old medical records available.   Chief Complaint: Left and right flank pain, poor appetite, feeling poorly for 3 days.  HPI: Kimberly Herman is a 56 y.o. female with medical history significant of type 2 diabetes, hypertension, recent extensive history of left infected stones of the kidneys and status post left nephrectomy 1 and half months ago presents to the emergency room with about 3 days of not feeling well, right flank pain that is persistent, worse with movements, no radiation, poor appetite, associated with fever of 100.5.  She had similar feeling when she had infected kidney stones in the past.  Also had change in urine color. ED Course: Temperature 100.5, blood pressure stable.  WBC count stable.  Lactic acid normal.  Urine was abnormal with WBC count more than 50.  CT scan with left perinephric/psoas abscess.  IV fluid resuscitation and antibiotics started and admission requested.  Review of Systems: all systems are reviewed and pertinent positive as per HPI otherwise rest are negative.    Past Medical History:  Diagnosis Date   Diabetes mellitus without complication ([place])    Hypertension    Menopause 2015   Vaginal polyp 10/2019   Vitamin D deficiency 08/2019    History reviewed. No pertinent surgical history.  Social history   reports that she has never smoked. She has never used smokeless tobacco. She reports that she does not currently use alcohol. She reports that she does not use drugs.  Allergies  Allergen Reactions   Amlodipine Swelling   Bactrim [Sulfamethoxazole-Trimethoprim] Nausea Only    Extreme nausea and felt really bad   Carvedilol Diarrhea    Family History  Problem Relation Age of Onset   Cancer Mother      Prior to Admission medications   Medication Sig Start  Date End Date Taking? Authorizing Provider  atorvastatin (LIPITOR) 40 MG tablet Take 1 tablet (40 mg total) by mouth daily. 05/19/22 08/17/22 Yes Fenton Foy, NP  candesartan (ATACAND) 32 MG tablet Take 1 tablet (32 mg total) by mouth daily. 03/17/22 03/17/23 Yes Passmore, Jake Church I, NP  chlorthalidone (HYGROTON) 25 MG tablet Take 1 tablet (25 mg total) by mouth daily. 05/19/22 11/15/22 Yes Fenton Foy, NP  insulin detemir (LEVEMIR) 100 UNIT/ML injection Inject 10 Units into the skin at bedtime.   Yes [provider]  blood glucose meter kit and supplies KIT Dispense based on patient and insurance preference. Use up to four times daily as directed. (FOR ICD-9 250.00, 250.01). 08/29/19   Azzie Glatter, FNP  Blood Pressure Monitoring (BLOOD PRESSURE CUFF) MISC Check blood pressure once daily at the same time and record readings 03/07/17   Scot Jun, FNP  glucose blood (TRUE METRIX BLOOD GLUCOSE TEST) test strip TEST UP TO FOUR TIMES DAILY 05/11/17   Scot Jun, FNP  insulin detemir (LEVEMIR) 100 UNIT/ML injection Inject 0.1 mLs (10 Units total) into the skin at bedtime. Patient taking differently: Inject 10 Units into the skin at bedtime as needed (for blood glucose greater than 240). 10/20/21 01/18/22  Bo Merino I, NP  omeprazole (PRILOSEC) 20 MG capsule Take 1 capsule (20 mg total) by mouth daily. Patient not taking: Reported on 05/19/2022 10/19/21   Bo Merino I, NP  tamsulosin (FLOMAX) 0.4 MG CAPS capsule Take 1 capsule (0.4  mg total) by mouth daily for 14 days. Patient not taking: Reported on 05/19/2022 04/08/22       Physical Exam: Vitals:   08/07/22 0615 08/07/22 0624 08/07/22 0718 08/07/22 0802  BP: (!) 108/50  126/62   Pulse: 64  60   Resp: 16  14   Temp:  98.9 F (37.2 C)  98.8 F (37.1 C)  TempSrc:  Oral  Oral  SpO2: 97%  98%   Weight:      Height:        Constitutional: NAD, calm, comfortable at rest. Vitals:   08/07/22 0615 08/07/22  0624 08/07/22 0718 08/07/22 0802  BP: (!) 108/50  126/62   Pulse: 64  60   Resp: 16  14   Temp:  98.9 F (37.2 C)  98.8 F (37.1 C)  TempSrc:  Oral  Oral  SpO2: 97%  98%   Weight:      Height:       Eyes: PERRL, lids and conjunctivae normal ENMT: Mucous membranes are moist. Posterior pharynx clear of any exudate or lesions.Normal dentition.  Neck: normal, supple, no masses, no thyromegaly Respiratory: clear to auscultation bilaterally, no wheezing, no crackles. Normal respiratory effort. No accessory muscle use.  Cardiovascular: Regular rate and rhythm, no murmurs / rubs / gallops. No extremity edema. 2+ pedal pulses. No carotid bruits.  Abdomen: Mild diffuse bilateral flank tenderness with no point tenderness or palpable abscess.  No masses palpated. No hepatosplenomegaly. Bowel sounds positive.  Musculoskeletal: no clubbing / cyanosis. No joint deformity upper and lower extremities. Good ROM, no contractures. Normal muscle tone.  Skin: no rashes, lesions, ulcers. No induration Neurologic: CN 2-12 grossly intact. Sensation intact, DTR normal. Strength 5/5 in all 4.  Psychiatric: Normal judgment and insight. Alert and oriented x 3. Normal mood.     Labs on Admission: I have personally reviewed following labs and imaging studies  CBC: Recent Labs  Lab 08/06/22 2126  WBC 10.3  NEUTROABS 7.7  HGB 10.3*  HCT 30.5*  MCV 85.9  PLT 528   Basic Metabolic Panel: Recent Labs  Lab 08/06/22 2126  NA 138  K 3.8  CL 105  CO2 25  GLUCOSE 160*  BUN 15  CREATININE 1.07*  CALCIUM 8.9   GFR: Estimated Creatinine Clearance: 61.7 mL/min (A) (by C-G formula based on SCr of 1.07 mg/dL (H)). Liver Function Tests: Recent Labs  Lab 08/06/22 2126  AST 20  ALT 18  ALKPHOS 68  BILITOT 0.6  PROT 8.4*  ALBUMIN 3.5   No results for input(s): "LIPASE", "AMYLASE" in the last 168 hours. No results for input(s): "AMMONIA" in the last 168 hours. Coagulation Profile: No results for  input(s): "INR", "PROTIME" in the last 168 hours. Cardiac Enzymes: No results for input(s): "CKTOTAL", "CKMB", "CKMBINDEX", "TROPONINI" in the last 168 hours. BNP (last 3 results) No results for input(s): "PROBNP" in the last 8760 hours. HbA1C: No results for input(s): "HGBA1C" in the last 72 hours. CBG: Recent Labs  Lab 08/06/22 2117  GLUCAP 153*   Lipid Profile: No results for input(s): "CHOL", "HDL", "LDLCALC", "TRIG", "CHOLHDL", "LDLDIRECT" in the last 72 hours. Thyroid Function Tests: No results for input(s): "TSH", "T4TOTAL", "FREET4", "T3FREE", "THYROIDAB" in the last 72 hours. Anemia Panel: No results for input(s): "VITAMINB12", "FOLATE", "FERRITIN", "TIBC", "IRON", "RETICCTPCT" in the last 72 hours. Urine analysis:    Component Value Date/Time   COLORURINE YELLOW 08/06/2022 0222   APPEARANCEUR HAZY (A) 08/06/2022 0222   APPEARANCEUR Turbid (A) 08/28/2019  1611   LABSPEC 1.014 08/06/2022 0222   PHURINE 5.0 08/06/2022 0222   GLUCOSEU NEGATIVE 08/06/2022 0222   HGBUR NEGATIVE 08/06/2022 0222   BILIRUBINUR NEGATIVE 08/06/2022 0222   BILIRUBINUR negative 05/19/2022 1610   BILIRUBINUR NEG 06/25/2020 1551   BILIRUBINUR Negative 08/28/2019 1611   KETONESUR NEGATIVE 08/06/2022 0222   PROTEINUR NEGATIVE 08/06/2022 0222   UROBILINOGEN 0.2 05/19/2022 1610   UROBILINOGEN 0.2 12/09/2017 1459   NITRITE POSITIVE (A) 08/06/2022 0222   LEUKOCYTESUR MODERATE (A) 08/06/2022 0222    Radiological Exams on Admission: CT Renal Stone Study  Result Date: 08/06/2022 CLINICAL DATA:  Right flank pain for 2 days with fever. History of left nephrectomy. EXAM: CT ABDOMEN AND PELVIS WITHOUT CONTRAST TECHNIQUE: Multidetector CT imaging of the abdomen and pelvis was performed following the standard protocol without IV contrast. RADIATION DOSE REDUCTION: This exam was performed according to the departmental dose-optimization program which includes automated exposure control, adjustment of the mA  and/or kV according to patient size and/or use of iterative reconstruction technique. COMPARISON:  03/08/2022. FINDINGS: Lower chest: No acute abnormality. Hepatobiliary: No focal liver abnormality is seen. Hepatic steatosis is noted. No gallstones, gallbladder wall thickening, or biliary dilatation. Pancreas: Unremarkable. No pancreatic ductal dilatation or surrounding inflammatory changes. Spleen: Normal in size without focal abnormality. Adrenals/Urinary Tract: The adrenal glands are within normal limits. The left kidney is surgically absent with fat stranding and soft tissue thickening in the left renal fossa extending into the iliopsoas muscle and pelvis on the left. There is a vague hypodense region in the iliopsoas and paraspinal soft tissues on the left measuring 4.0 x 1.7 x 6.9 cm. Examination is limited due to lack of IV contrast. There is asymmetric enlargement of the iliacus muscle on the left, not significantly changed from the prior exam. No intramuscular hematoma. No renal calculus or hydronephrosis on the right. Bladder is unremarkable. Stomach/Bowel: Stomach is within normal limits. Appendix appears normal. No evidence of bowel wall thickening, distention, or inflammatory changes. No free air or pneumatosis. A few scattered diverticula are present along the colon without evidence of diverticulitis. Vascular/Lymphatic: Aortic atherosclerosis. Prominent lymph nodes are noted in the periaortic space in the retroperitoneum on the left which may be reactive. Reproductive: The uterus is enlarged and contains multiple fibroids. No adnexal mass. Other: No abdominopelvic ascites. Musculoskeletal: Degenerative changes are present in the thoracolumbar spine. No acute osseous abnormality. IMPRESSION: 1. No renal calculus or hydronephrosis on the right. 2. Status post left nephrectomy with fat stranding in the left renal fossa extending into the retroperitoneum and pelvis on the left. There is a vague hypodense  region in the iliopsoas muscle on the left measuring 4.0 x 1.7 x 6.9 cm, increased in size from the previous exam and may represent abscess. Evaluation is limited due to lack of IV contrast. 3. Hepatic steatosis. 4. Enlarged fibroid uterus. 5. Aortic atherosclerosis. Electronically Signed   By: Brett Fairy M.D.   On: 08/06/2022 23:23     Assessment/Plan Principal Problem:   Renal abscess, left Active Problems:   T2DM (type 2 diabetes mellitus) ([place])   HTN (hypertension)   Hyperlipidemia     1.  Left-sided perinephric/psoas muscle abscess versus infected hematoma: Admitted due to severity of symptoms.  IV fluid to continue.  Previous history of Citrobacter UTI, will treat with cefepime until urine cultures and blood cultures available. Case discussed with interventional radiology, they are following.  Currently unsure about presence of abscess so decided to treat conservatively with IV  fluids and IV antibiotics. Adequate pain medications.  2.  Type 2 diabetes: Well-controlled.  On insulin at home.  Resume lower dose of insulin.  3.  Essential hypertension: Blood pressures are stable.  Hold antihypertensives today with risk of hypotension.  4.  Hyperlipidemia: Hold statin.  DVT prophylaxis: Subcu heparin Code Status: Full code Family Communication: None Disposition Plan: Home Consults called: Interventional radiology Admission status: Inpatient, MedSurg   Barb Merino MD [facility] Pager [phone]

## 2022-08-07 NOTE — ED Notes (Signed)
Patient is in room with husband at bedside.

## 2022-08-07 NOTE — Sepsis Progress Note (Signed)
Following per sepsis protocol   

## 2022-08-07 NOTE — Progress Notes (Signed)
Pharmacy Antibiotic Note  Kimberly Herman is a 56 y.o. female with hx kidney stone and s/p left nephrectomy a few weeks PTA who presented to the ED on 08/06/2022 with fever and flank pain.  Pharmacy has been consulted to dose cefepime for suspected sepsis.  Plan: - cefepime 2gm IV q8h  _______________________________________  Height: '5\' 6"'$  (167.6 cm) Weight: 81.6 kg (180 lb) IBW/kg (Calculated) : 59.3  Temp (24hrs), Avg:99.1 F (37.3 C), Min:98.2 F (36.8 C), Max:100 F (37.8 C)  Recent Labs  Lab 08/06/22 2126 08/07/22 0019  WBC 10.3  --   CREATININE 1.07*  --   LATICACIDVEN 0.8 1.0    Estimated Creatinine Clearance: 61.7 mL/min (A) (by C-G formula based on SCr of 1.07 mg/dL (H)).    Allergies  Allergen Reactions   Amlodipine Swelling   Bactrim [Sulfamethoxazole-Trimethoprim] Nausea Only    Extreme nausea and felt really bad   Carvedilol Diarrhea     Thank you for allowing pharmacy to be a part of this patient's care.  Lynelle Doctor 08/07/2022 7:32 AM

## 2022-08-08 LAB — CBC
HCT: 26 % — ABNORMAL LOW (ref 36.0–46.0)
Hemoglobin: 8.6 g/dL — ABNORMAL LOW (ref 12.0–15.0)
MCH: 29.2 pg (ref 26.0–34.0)
MCHC: 33.1 g/dL (ref 30.0–36.0)
MCV: 88.1 fL (ref 80.0–100.0)
Platelets: 236 10*3/uL (ref 150–400)
RBC: 2.95 MIL/uL — ABNORMAL LOW (ref 3.87–5.11)
RDW: 13 % (ref 11.5–15.5)
WBC: 7.7 10*3/uL (ref 4.0–10.5)
nRBC: 0 % (ref 0.0–0.2)

## 2022-08-08 LAB — URINE CULTURE: Culture: <10000 — AB

## 2022-08-08 LAB — BASIC METABOLIC PANEL
Anion gap: 6 (ref 5–15)
BUN: 10 mg/dL (ref 6–20)
CO2: 27 mmol/L (ref 22–32)
Calcium: 8.6 mg/dL — ABNORMAL LOW (ref 8.9–10.3)
Chloride: 111 mmol/L (ref 98–111)
Creatinine, Ser: 1.17 mg/dL — ABNORMAL HIGH (ref 0.44–1.00)
GFR, Estimated: 54 mL/min — ABNORMAL LOW (ref >60)
Glucose, Bld: 149 mg/dL — ABNORMAL HIGH (ref 70–99)
Potassium: 4.1 mmol/L (ref 3.5–5.1)
Sodium: 144 mmol/L (ref 135–145)

## 2022-08-08 LAB — GLUCOSE, CAPILLARY
Glucose-Capillary: 135 mg/dL — ABNORMAL HIGH (ref 70–99)
Glucose-Capillary: 121 mg/dL — ABNORMAL HIGH (ref 70–99)
Glucose-Capillary: 147 mg/dL — ABNORMAL HIGH (ref 70–99)
Glucose-Capillary: 110 mg/dL — ABNORMAL HIGH (ref 70–99)

## 2022-08-08 NOTE — Progress Notes (Signed)
PROGRESS NOTE    Kimberly Herman  IFO:[id number] DOB: 1966-11-09 DOA: 08/06/2022 PCP: Bo Merino I, NP    Brief Narrative:  56 y.o. female with medical history significant of type 2 diabetes, hypertension, recent extensive history of left infected stones of the kidneys and status post left nephrectomy 1 and half months ago presents to the emergency room with about 3 days of not feeling well, right flank pain, patient was found to have left perinephric/abscess and admitted to the hospital with IR consultation.  Assessment & Plan:   1.  Left-sided perinephric/psoas muscle abscess versus infected hematoma: Blood cultures and urine cultures pending.  Continue cefepime. Followed by interventional radiology, closely monitoring for need for percutaneous drainage. Continue IV fluids and IV antibiotics.  Adequate pain medications.  Mobility.   2.  Type 2 diabetes: Well-controlled.  On insulin at home.  Resume lower dose of insulin.   3.  Essential hypertension: Blood pressures are stable.  Hold antihypertensives today with risk of hypotension.   4.  Hyperlipidemia: Hold statin.     DVT prophylaxis: heparin injection 5,000 Units Start: 08/07/22 1400 SCDs Start: 08/07/22 0932   Code Status: Full code Family Communication: None Disposition Plan: Status is: Inpatient Remains inpatient appropriate because: IV antibiotics, IV fluids, inpatient procedures anticipated     Consultants:  Interventional radiology  Procedures:  None  Antimicrobials:  Cefepime 9/16---   Subjective: Patient seen and examined.  Continues to have mild to moderate left flank pain.  She had temperature 102 yesterday evening and had low-grade temperature overnight.  Objective: Vitals:   08/07/22 1703 08/07/22 2108 08/07/22 2352 08/08/22 0451  BP: (!) 149/62 139/64 (!) 149/75 (!) 148/74  Pulse: 81 80 80 78  Resp: '18 20 20 20  '$ Temp: (!) 102.5 F (39.2 C) 100 F (37.8 C) 98.5 F (36.9 C) 98.3 F (36.8  C)  TempSrc: Oral Oral Oral Oral  SpO2: 96% 100% 99% 100%  Weight:      Height:        Intake/Output Summary (Last 24 hours) at 08/08/2022 1033 Last data filed at 08/08/2022 0500 Gross per 24 hour  Intake 2505.47 ml  Output --  Net 2505.47 ml   Filed Weights   08/06/22 2113  Weight: 81.6 kg    Examination:  General exam: Appears calm and comfortable  Appropriately anxious.  In mild distress on mobility. Respiratory system: Clear to auscultation. Respiratory effort normal. Cardiovascular system: S1 & S2 heard, RRR.  Gastrointestinal system: Soft.  Nontender.  She does have mild tenderness along the left flank with no palpable abscess or fluctuation.  Previous scar is healthy with no drainage.   Central nervous system: Alert and oriented. No focal neurological deficits. Extremities: Symmetric 5 x 5 power. Skin: No rashes, lesions or ulcers Psychiatry: Judgement and insight appear normal. Mood & affect appropriate.     Data Reviewed: I have personally reviewed following labs and imaging studies  CBC: Recent Labs  Lab 08/06/22 2126 08/08/22 0359  WBC 10.3 7.7  NEUTROABS 7.7  --   HGB 10.3* 8.6*  HCT 30.5* 26.0*  MCV 85.9 88.1  PLT 329 676   Basic Metabolic Panel: Recent Labs  Lab 08/06/22 2126 08/08/22 0359  NA 138 144  K 3.8 4.1  CL 105 111  CO2 25 27  GLUCOSE 160* 149*  BUN 15 10  CREATININE 1.07* 1.17*  CALCIUM 8.9 8.6*   GFR: Estimated Creatinine Clearance: 56.4 mL/min (A) (by C-G formula based on SCr of  1.17 mg/dL (H)). Liver Function Tests: Recent Labs  Lab 08/06/22 2126  AST 20  ALT 18  ALKPHOS 68  BILITOT 0.6  PROT 8.4*  ALBUMIN 3.5   No results for input(s): "LIPASE", "AMYLASE" in the last 168 hours. No results for input(s): "AMMONIA" in the last 168 hours. Coagulation Profile: No results for input(s): "INR", "PROTIME" in the last 168 hours. Cardiac Enzymes: No results for input(s): "CKTOTAL", "CKMB", "CKMBINDEX", "TROPONINI" in the  last 168 hours. BNP (last 3 results) No results for input(s): "PROBNP" in the last 8760 hours. HbA1C: No results for input(s): "HGBA1C" in the last 72 hours. CBG: Recent Labs  Lab 08/06/22 2117 08/07/22 1705 08/07/22 2105 08/08/22 0827  GLUCAP 153* 109* 194* 135*   Lipid Profile: No results for input(s): "CHOL", "HDL", "LDLCALC", "TRIG", "CHOLHDL", "LDLDIRECT" in the last 72 hours. Thyroid Function Tests: No results for input(s): "TSH", "T4TOTAL", "FREET4", "T3FREE", "THYROIDAB" in the last 72 hours. Anemia Panel: No results for input(s): "VITAMINB12", "FOLATE", "FERRITIN", "TIBC", "IRON", "RETICCTPCT" in the last 72 hours. Sepsis Labs: Recent Labs  Lab 08/06/22 2126 08/07/22 0019  LATICACIDVEN 0.8 1.0    Recent Results (from the past 240 hour(s))  Blood culture (routine x 2)     Status: None (Preliminary result)   Collection Time: 08/07/22 12:30 AM   Specimen: BLOOD  Result Value Ref Range Status   Specimen Description   Final    BLOOD BLOOD LEFT FOREARM Performed at [facility] [address], [place], [place] [address]    Special Requests   Final    BOTTLES DRAWN AEROBIC AND ANAEROBIC Blood Culture results may not be optimal due to an excessive volume of blood received in culture bottles Performed at [facility] [address] [place], [place] [address]    Culture   Final    NO GROWTH 1 DAY Performed at [facility], [place] [address], [place], [place] [address]    Report Status PENDING  Incomplete  Urine Culture     Status: Abnormal   Collection Time: 08/07/22  2:53 AM   Specimen: Urine, Clean Catch  Result Value Ref Range Status   Specimen Description   Final    URINE, CLEAN CATCH Performed at [facility], [place] [address], [place], [place] [address]    Special Requests   Final    NONE Performed at [facility], [place] [address], [place], [place] [address]    Culture (A)   Final    <10,000 COLONIES/mL INSIGNIFICANT GROWTH Performed at [facility] [address], [place], [place] [address]    Report Status 08/08/2022 FINAL  Final  Blood culture (routine x 2)     Status: None (Preliminary result)   Collection Time: 08/08/22  3:59 AM   Specimen: BLOOD  Result Value Ref Range Status   Specimen Description   Final    BLOOD RIGHT ANTECUBITAL Performed at [facility] [address], [place], [place] [address]    Special Requests   Final    BOTTLES DRAWN AEROBIC ONLY Blood Culture results may not be optimal due to an inadequate volume of blood received in culture bottles Performed at [facility] [address], [place], [place] [address]    Culture   Final    NO GROWTH < 12 HOURS Performed at [facility] [address], [place],  [address]    Report Status PENDING  Incomplete  Radiology Studies: CT Renal Stone Study  Result Date: 08/06/2022 CLINICAL DATA:  Right flank pain for 2 days with fever. History of left nephrectomy. EXAM: CT ABDOMEN AND PELVIS WITHOUT CONTRAST TECHNIQUE: Multidetector CT imaging of the abdomen and pelvis was performed following the standard protocol without IV contrast. RADIATION DOSE REDUCTION: This exam was performed according to the departmental dose-optimization program which includes automated exposure control, adjustment of the mA and/or kV according to patient size and/or use of iterative reconstruction technique. COMPARISON:  03/08/2022. FINDINGS: Lower chest: No acute abnormality. Hepatobiliary: No focal liver abnormality is seen. Hepatic steatosis is noted. No gallstones, gallbladder wall thickening, or biliary dilatation. Pancreas: Unremarkable. No pancreatic ductal dilatation or surrounding inflammatory changes. Spleen: Normal in size without focal abnormality. Adrenals/Urinary Tract: The adrenal glands are within normal limits. The left kidney is  surgically absent with fat stranding and soft tissue thickening in the left renal fossa extending into the iliopsoas muscle and pelvis on the left. There is a vague hypodense region in the iliopsoas and paraspinal soft tissues on the left measuring 4.0 x 1.7 x 6.9 cm. Examination is limited due to lack of IV contrast. There is asymmetric enlargement of the iliacus muscle on the left, not significantly changed from the prior exam. No intramuscular hematoma. No renal calculus or hydronephrosis on the right. Bladder is unremarkable. Stomach/Bowel: Stomach is within normal limits. Appendix appears normal. No evidence of bowel wall thickening, distention, or inflammatory changes. No free air or pneumatosis. A few scattered diverticula are present along the colon without evidence of diverticulitis. Vascular/Lymphatic: Aortic atherosclerosis. Prominent lymph nodes are noted in the periaortic space in the retroperitoneum on the left which may be reactive. Reproductive: The uterus is enlarged and contains multiple fibroids. No adnexal mass. Other: No abdominopelvic ascites. Musculoskeletal: Degenerative changes are present in the thoracolumbar spine. No acute osseous abnormality. IMPRESSION: 1. No renal calculus or hydronephrosis on the right. 2. Status post left nephrectomy with fat stranding in the left renal fossa extending into the retroperitoneum and pelvis on the left. There is a vague hypodense region in the iliopsoas muscle on the left measuring 4.0 x 1.7 x 6.9 cm, increased in size from the previous exam and may represent abscess. Evaluation is limited due to lack of IV contrast. 3. Hepatic steatosis. 4. Enlarged fibroid uterus. 5. Aortic atherosclerosis. Electronically Signed   By: Brett Fairy M.D.   On: 08/06/2022 23:23        Scheduled Meds:  heparin  5,000 Units Subcutaneous Q8H   insulin aspart  0-5 Units Subcutaneous QHS   insulin aspart  0-9 Units Subcutaneous TID WC   Continuous Infusions:   ceFEPime (MAXIPIME) IV 2 g (08/08/22 0856)   lactated ringers 125 mL/hr at 08/08/22 0500     LOS: 1 day    Time spent: 35 minutes    Barb Merino, MD [facility] Pager [phone]

## 2022-08-09 ENCOUNTER — Inpatient Hospital Stay (HOSPITAL_COMMUNITY): Payer: Self-pay

## 2022-08-09 LAB — GLUCOSE, CAPILLARY
Glucose-Capillary: 150 mg/dL — ABNORMAL HIGH (ref 70–99)
Glucose-Capillary: 175 mg/dL — ABNORMAL HIGH (ref 70–99)
Glucose-Capillary: 144 mg/dL — ABNORMAL HIGH (ref 70–99)
Glucose-Capillary: 142 mg/dL — ABNORMAL HIGH (ref 70–99)

## 2022-08-09 LAB — CBC WITH DIFFERENTIAL/PLATELET
Abs Immature Granulocytes: 0.02 10*3/uL (ref 0.00–0.07)
Basophils Absolute: 0 10*3/uL (ref 0.0–0.1)
Basophils Relative: 1 %
Eosinophils Absolute: 0.2 10*3/uL (ref 0.0–0.5)
Eosinophils Relative: 3 %
HCT: 28.9 % — ABNORMAL LOW (ref 36.0–46.0)
Hemoglobin: 9.3 g/dL — ABNORMAL LOW (ref 12.0–15.0)
Immature Granulocytes: 0 %
Lymphocytes Relative: 24 %
Lymphs Abs: 1.5 10*3/uL (ref 0.7–4.0)
MCH: 28.6 pg (ref 26.0–34.0)
MCHC: 32.2 g/dL (ref 30.0–36.0)
MCV: 88.9 fL (ref 80.0–100.0)
Monocytes Absolute: 0.4 10*3/uL (ref 0.1–1.0)
Monocytes Relative: 6 %
Neutro Abs: 4.2 10*3/uL (ref 1.7–7.7)
Neutrophils Relative %: 66 %
Platelets: 274 10*3/uL (ref 150–400)
RBC: 3.25 MIL/uL — ABNORMAL LOW (ref 3.87–5.11)
RDW: 13.1 % (ref 11.5–15.5)
WBC: 6.4 10*3/uL (ref 4.0–10.5)
nRBC: 0 % (ref 0.0–0.2)

## 2022-08-09 LAB — BASIC METABOLIC PANEL
Anion gap: 8 (ref 5–15)
BUN: 8 mg/dL (ref 6–20)
CO2: 28 mmol/L (ref 22–32)
Calcium: 8.8 mg/dL — ABNORMAL LOW (ref 8.9–10.3)
Chloride: 106 mmol/L (ref 98–111)
Creatinine, Ser: 0.84 mg/dL (ref 0.44–1.00)
GFR, Estimated: >60 mL/min (ref >60)
Glucose, Bld: 132 mg/dL — ABNORMAL HIGH (ref 70–99)
Potassium: 3.7 mmol/L (ref 3.5–5.1)
Sodium: 142 mmol/L (ref 135–145)

## 2022-08-09 LAB — PROTIME-INR
INR: 1.2 (ref 0.8–1.2)
Prothrombin Time: 15 seconds (ref 11.4–15.2)

## 2022-08-09 MED ORDER — SODIUM CHLORIDE (PF) 0.9 % IJ SOLN
INTRAMUSCULAR | Status: AC
  Filled 2022-08-09: qty 50

## 2022-08-09 MED ORDER — IOHEXOL 9 MG/ML PO SOLN
500.0000 mL | ORAL | Status: AC
  Administered 2022-08-09 (×2): 500 mL via ORAL

## 2022-08-09 MED ORDER — IOHEXOL 300 MG/ML  SOLN
100.0000 mL | Freq: Once | INTRAMUSCULAR | Status: AC | PRN
  Administered 2022-08-09: 100 mL via INTRAVENOUS

## 2022-08-09 MED ORDER — IOHEXOL 9 MG/ML PO SOLN
ORAL | Status: AC
  Filled 2022-08-09: qty 1000

## 2022-08-09 MED ORDER — HEPARIN SODIUM (PORCINE) 5000 UNIT/ML IJ SOLN
5000.0000 [IU] | Freq: Three times a day (TID) | INTRAMUSCULAR | Status: DC
  Filled 2022-08-09: qty 1

## 2022-08-09 NOTE — Progress Notes (Signed)
Referring Physician(s): Pennington  Supervising Physician: Jacqulynn Cadet  Patient Status:  [facility] - In-pt  Chief Complaint:  Flank pain, left nephrectomy bed fluid collection concerning for abscess  Subjective: Patient familiar to IR service from CT-guided placement of left perinephric abscess drain on 03/05/2022.  She as a past medical history significant for diabetes, hypertension, vitamin D deficiency, chronic diastolic heart failure and nephrolithiasis.  At the time of her perinephric abscess drain percutaneous nephrostomy access was also requested for eventual PCNL however there was minimal hydronephrosis and guarded access to the lower pole stones from the abscess therefore PCN was deferred. She subsequently underwent left nephrectomy on 06/28/22 at [facility].  She was admitted to [facility] on 9/15 with flank pain, poor appetite and fever.  CT scan revealed no renal calculus or hydronephrosis on the right.  There was some fat stranding in the left renal fossa extending into the retroperitoneum and pelvis on the left with vague hypodense region in the iliopsoas muscle on the left possibly representing abscess.  Also noted was hepatic steatosis, enlarged fibroid uterus.  Due to persistent temperature elevation patient underwent follow-up CT today which revealed complex ill-defined fluid collection with peripheral enhancement in the left psoas muscle/retroperitoneum concerning for abscess . Request  now received for image guided aspiration/drainage of this collection. Currently she is afebrile, WBC nl, hgb 9.3,plts nl, creat nl, PT/INR nl. Blood cx neg to date   Past Medical History:  Diagnosis Date   Diabetes mellitus without complication ([place])    Hypertension    Menopause 2015   Vaginal polyp 10/2019   Vitamin D deficiency 08/2019   History reviewed. No pertinent surgical history.    Allergies: Amlodipine, Bactrim [sulfamethoxazole-trimethoprim], and  Carvedilol  Medications: Prior to Admission medications   Medication Sig Start Date End Date Taking? Authorizing Provider  atorvastatin (LIPITOR) 40 MG tablet Take 1 tablet (40 mg total) by mouth daily. 05/19/22 08/17/22 Yes Fenton Foy, NP  candesartan (ATACAND) 32 MG tablet Take 1 tablet (32 mg total) by mouth daily. 03/17/22 03/17/23 Yes Passmore, Jake Church I, NP  chlorthalidone (HYGROTON) 25 MG tablet Take 1 tablet (25 mg total) by mouth daily. 05/19/22 11/15/22 Yes Fenton Foy, NP  insulin detemir (LEVEMIR) 100 UNIT/ML injection Inject 10 Units into the skin at bedtime.   Yes [provider]  blood glucose meter kit and supplies KIT Dispense based on patient and insurance preference. Use up to four times daily as directed. (FOR ICD-9 250.00, 250.01). 08/29/19   Azzie Glatter, FNP  Blood Pressure Monitoring (BLOOD PRESSURE CUFF) MISC Check blood pressure once daily at the same time and record readings 03/07/17   Scot Jun, FNP  glucose blood (TRUE METRIX BLOOD GLUCOSE TEST) test strip TEST UP TO FOUR TIMES DAILY 05/11/17   Scot Jun, FNP  insulin detemir (LEVEMIR) 100 UNIT/ML injection Inject 0.1 mLs (10 Units total) into the skin at bedtime. Patient taking differently: Inject 10 Units into the skin at bedtime as needed (for blood glucose greater than 240). 10/20/21 01/18/22  Bo Merino I, NP  omeprazole (PRILOSEC) 20 MG capsule Take 1 capsule (20 mg total) by mouth daily. Patient not taking: Reported on 05/19/2022 10/19/21   Bo Merino I, NP  tamsulosin (FLOMAX) 0.4 MG CAPS capsule Take 1 capsule (0.4 mg total) by mouth daily for 14 days. Patient not taking: Reported on 05/19/2022 04/08/22        Vital Signs: BP (!) 154/75 (BP Location: Left  Arm)   Pulse 74   Temp 98 F (36.7 C) (Oral)   Resp 17   Ht '5\' 6"'  (1.676 m)   Wt 180 lb (81.6 kg)   SpO2 100%   BMI 29.05 kg/m   Physical Exam : awake/alert; chest- CTA bilat; heart- RRR; abd-  soft,+BS,mild- mod left lat abd/flank tenderness; no LE edema  Imaging: CT ABDOMEN PELVIS W CONTRAST  Result Date: 08/09/2022 CLINICAL DATA:  Abdominal abscess (Ped 0-17y) perinephric abscess , persistent fever . study with iv contrast to look for abscess EXAM: CT ABDOMEN AND PELVIS WITH CONTRAST TECHNIQUE: Multidetector CT imaging of the abdomen and pelvis was performed using the standard protocol following bolus administration of intravenous contrast. RADIATION DOSE REDUCTION: This exam was performed according to the departmental dose-optimization program which includes automated exposure control, adjustment of the mA and/or kV according to patient size and/or use of iterative reconstruction technique. CONTRAST:  13m OMNIPAQUE IOHEXOL 300 MG/ML  SOLN COMPARISON:  08/06/2022 FINDINGS: Lower chest: Minimal left base atelectasis. No confluent opacity on the right. No effusions. Hepatobiliary: Diffuse low-density throughout the liver compatible with fatty infiltration. No focal abnormality. Gallbladder unremarkable. Pancreas: No focal abnormality or ductal dilatation. Spleen: No focal abnormality.  Normal size. Adrenals/Urinary Tract: Status post left nephrectomy. Ill-defined low-density area within the left psoas muscle with peripheral enhancement measuring 5.4 x 2.3 cm concerning for abscess. Surrounding retroperitoneal stranding. No stones or hydronephrosis or mass in the right kidney. Adrenal glands unremarkable. Urinary bladder decompressed, grossly unremarkable. Stomach/Bowel: Normal appendix. Stomach, large and small bowel grossly unremarkable. Vascular/Lymphatic: No evidence of aneurysm or adenopathy. Reproductive: Lobular uterus compatible with fibroids. No adnexal mass. Other: No free fluid or free air. Musculoskeletal: No acute bony abnormality. IMPRESSION: Complex somewhat ill-defined fluid collection with peripheral enhancement in the left psoas muscle/retroperitoneum concerning for abscess. Status  post left nephrectomy. Hepatic steatosis. Fibroid uterus. Electronically Signed   By: KRolm BaptiseM.D.   On: 08/09/2022 13:06   CT Renal Stone Study  Result Date: 08/06/2022 CLINICAL DATA:  Right flank pain for 2 days with fever. History of left nephrectomy. EXAM: CT ABDOMEN AND PELVIS WITHOUT CONTRAST TECHNIQUE: Multidetector CT imaging of the abdomen and pelvis was performed following the standard protocol without IV contrast. RADIATION DOSE REDUCTION: This exam was performed according to the departmental dose-optimization program which includes automated exposure control, adjustment of the mA and/or kV according to patient size and/or use of iterative reconstruction technique. COMPARISON:  03/08/2022. FINDINGS: Lower chest: No acute abnormality. Hepatobiliary: No focal liver abnormality is seen. Hepatic steatosis is noted. No gallstones, gallbladder wall thickening, or biliary dilatation. Pancreas: Unremarkable. No pancreatic ductal dilatation or surrounding inflammatory changes. Spleen: Normal in size without focal abnormality. Adrenals/Urinary Tract: The adrenal glands are within normal limits. The left kidney is surgically absent with fat stranding and soft tissue thickening in the left renal fossa extending into the iliopsoas muscle and pelvis on the left. There is a vague hypodense region in the iliopsoas and paraspinal soft tissues on the left measuring 4.0 x 1.7 x 6.9 cm. Examination is limited due to lack of IV contrast. There is asymmetric enlargement of the iliacus muscle on the left, not significantly changed from the prior exam. No intramuscular hematoma. No renal calculus or hydronephrosis on the right. Bladder is unremarkable. Stomach/Bowel: Stomach is within normal limits. Appendix appears normal. No evidence of bowel wall thickening, distention, or inflammatory changes. No free air or pneumatosis. A few scattered diverticula are present along the colon without  evidence of diverticulitis.  Vascular/Lymphatic: Aortic atherosclerosis. Prominent lymph nodes are noted in the periaortic space in the retroperitoneum on the left which may be reactive. Reproductive: The uterus is enlarged and contains multiple fibroids. No adnexal mass. Other: No abdominopelvic ascites. Musculoskeletal: Degenerative changes are present in the thoracolumbar spine. No acute osseous abnormality. IMPRESSION: 1. No renal calculus or hydronephrosis on the right. 2. Status post left nephrectomy with fat stranding in the left renal fossa extending into the retroperitoneum and pelvis on the left. There is a vague hypodense region in the iliopsoas muscle on the left measuring 4.0 x 1.7 x 6.9 cm, increased in size from the previous exam and may represent abscess. Evaluation is limited due to lack of IV contrast. 3. Hepatic steatosis. 4. Enlarged fibroid uterus. 5. Aortic atherosclerosis. Electronically Signed   By: Brett Fairy M.D.   On: 08/06/2022 23:23    Labs:  CBC: Recent Labs    03/24/22 1601 08/06/22 2126 08/08/22 0359 08/09/22 0353  WBC 3.4* 10.3 7.7 6.4  HGB 12.5 10.3* 8.6* 9.3*  HCT 37.7 30.5* 26.0* 28.9*  PLT 258 329 236 274    COAGS: Recent Labs    03/05/22 0630 08/09/22 0855  INR 1.5* 1.2    BMP: Recent Labs    03/09/22 0434 03/24/22 1601 08/06/22 2126 08/08/22 0359 08/09/22 0353  NA 142 142 138 144 142  K 3.8 4.1 3.8 4.1 3.7  CL 105 105 105 111 106  CO2 '29 31 25 27 28  ' GLUCOSE 146* 212* 160* 149* 132*  BUN '10 13 15 10 8  ' CALCIUM 8.9 9.4 8.9 8.6* 8.8*  CREATININE 0.93 1.05* 1.07* 1.17* 0.84  GFRNONAA >60  --  >60 54* >60    LIVER FUNCTION TESTS: Recent Labs    03/05/22 0312 03/06/22 0528 03/09/22 0434 08/06/22 2126  BILITOT 1.3* 0.8 0.5 0.6  AST 14* '20 20 20  ' ALT '16 18 14 18  ' ALKPHOS 57 57 73 68  PROT 6.7 6.2* 7.3 8.4*  ALBUMIN 2.9* 2.6* 3.1* 3.5    Assessment and Plan: Patient familiar to IR service from CT-guided placement of left perinephric abscess drain on  03/05/2022.  She as a past medical history significant for diabetes, hypertension, vitamin D deficiency, chronic diastolic heart failure and nephrolithiasis.  At the time of her perinephric abscess drain percutaneous nephrostomy access was also requested for eventual PCNL however there was minimal hydronephrosis and guarded access to the lower pole stones from the abscess therefore PCN was deferred. She subsequently underwent left nephrectomy on 06/28/22 at [facility].  She was admitted to [facility] on 9/15 with flank pain, poor appetite and fever.  CT scan revealed no renal calculus or hydronephrosis on the right.  There was some fat stranding in the left renal fossa extending into the retroperitoneum and pelvis on the left with vague hypodense region in the iliopsoas muscle on the left possibly representing abscess.  Also noted was hepatic steatosis, enlarged fibroid uterus.  Due to persistent temperature elevation patient underwent follow-up CT today which revealed complex ill-defined fluid collection with peripheral enhancement in the left psoas muscle/retroperitoneum concerning for abscess . Request  now received for image guided aspiration/drainage of this collection. Currently she is afebrile, WBC nl, hgb 9.3,plts nl, creat nl, PT/INR nl. Blood cx neg to date. Latest imaging studies were reviewed by Dr. Laurence Ferrari. Risks and benefits discussed with the patient including bleeding, infection, damage to adjacent structures, bowel perforation/fistula connection, and sepsis.  All of  the patient's questions were answered, patient is agreeable to proceed. Consent signed and in chart. Procedure tent planned for tomorrow or 9/20.    Electronically Signed: D. Rowe Robert, PA-C 08/09/2022, 2:12 PM   I spent a total of 25 minutes at the the patient's bedside AND on the patient's hospital floor or unit, greater than 50% of which was counseling/coordinating care for image guided aspiration/possible  drainage of left psoas/retroperitoneal fluid collection    Patient ID: Kimberly Herman, female   DOB: 07/10/1966, 56 y.o.   MRN: [id number]

## 2022-08-09 NOTE — Progress Notes (Signed)
PROGRESS NOTE    Kimberly Herman  BWG:[id number] DOB: 21-Jan-1966 DOA: 08/06/2022 PCP: Bo Merino I, NP    Brief Narrative:  56 y.o. female with medical history significant of type 2 diabetes, hypertension, recent extensive history of left infected stones of the kidneys and status post left nephrectomy 1 and half months ago presents to the emergency room with about 3 days of not feeling well, right flank pain, patient was found to have left perinephric/abscess and admitted to the hospital with IR consultation.  Assessment & Plan:   1.  Left-sided perinephric/psoas muscle abscess versus infected hematoma: Blood cultures negative.  Urine culture with insignificant growth.  Currently on cefepime with previous history of Citrobacter. Persistent fever.  Renal functions fairly stable today to do a contrasted studies.  Discussed with IR, will order CT scan with contrast today.  IR to follow-up for possible need for drainage. Continue IV fluids and IV antibiotics.  Adequate pain medications.  Mobility.   2.  Type 2 diabetes: Well-controlled.  On insulin at home.  On insulin.   3.  Essential hypertension: Blood pressures are stable.  Holding antihypertensives.   4.  Hyperlipidemia: Hold statin.   Case discussed with interventional radiology.  We will do CT scan with contrast and likely will need drainage.   DVT prophylaxis: heparin injection 5,000 Units Start: 08/07/22 1400 SCDs Start: 08/07/22 0932   Code Status: Full code Family Communication: None Disposition Plan: Status is: Inpatient Remains inpatient appropriate because: IV antibiotics, IV fluids, inpatient procedures anticipated     Consultants:  Interventional radiology  Procedures:  None  Antimicrobials:  Cefepime 9/16---   Subjective: Mild flank pain persist, temperature 100.7 overnight.  Objective: Vitals:   08/08/22 0451 08/08/22 1147 08/08/22 2142 08/09/22 0612  BP: (!) 148/74 (!) 142/80 (!) 165/71 (!)  154/75  Pulse: 78 80 70 74  Resp: '20 18 17 17  '$ Temp: 98.3 F (36.8 C) 98.8 F (37.1 C) (!) 100.7 F (38.2 C) 98 F (36.7 C)  TempSrc: Oral Oral Oral Oral  SpO2: 100% 99% 98% 100%  Weight:      Height:        Intake/Output Summary (Last 24 hours) at 08/09/2022 1118 Last data filed at 08/09/2022 0600 Gross per 24 hour  Intake 3234.31 ml  Output --  Net 3234.31 ml   Filed Weights   08/06/22 2113  Weight: 81.6 kg    Examination: General: Looks fairly comfortable.  Walking around in the room. Cardiovascular: S1-S2 normal.  Regular rate rhythm. Respiratory: Bilateral clear.  No added sounds. Gastrointestinal: Soft.  Nontender.  She does have mild tenderness about the left flank with no palpable swelling. Ext: No deformities. Neuro: Intact.     Data Reviewed: I have personally reviewed following labs and imaging studies  CBC: Recent Labs  Lab 08/06/22 2126 08/08/22 0359 08/09/22 0353  WBC 10.3 7.7 6.4  NEUTROABS 7.7  --  4.2  HGB 10.3* 8.6* 9.3*  HCT 30.5* 26.0* 28.9*  MCV 85.9 88.1 88.9  PLT 329 236 177   Basic Metabolic Panel: Recent Labs  Lab 08/06/22 2126 08/08/22 0359 08/09/22 0353  NA 138 144 142  K 3.8 4.1 3.7  CL 105 111 106  CO2 '25 27 28  '$ GLUCOSE 160* 149* 132*  BUN '15 10 8  '$ CREATININE 1.07* 1.17* 0.84  CALCIUM 8.9 8.6* 8.8*   GFR: Estimated Creatinine Clearance: 78.6 mL/min (by C-G formula based on SCr of 0.84 mg/dL). Liver Function Tests: Recent Labs  Lab 08/06/22 2126  AST 20  ALT 18  ALKPHOS 68  BILITOT 0.6  PROT 8.4*  ALBUMIN 3.5   No results for input(s): "LIPASE", "AMYLASE" in the last 168 hours. No results for input(s): "AMMONIA" in the last 168 hours. Coagulation Profile: Recent Labs  Lab 08/09/22 0855  INR 1.2   Cardiac Enzymes: No results for input(s): "CKTOTAL", "CKMB", "CKMBINDEX", "TROPONINI" in the last 168 hours. BNP (last 3 results) No results for input(s): "PROBNP" in the last 8760 hours. HbA1C: No results  for input(s): "HGBA1C" in the last 72 hours. CBG: Recent Labs  Lab 08/08/22 0827 08/08/22 1144 08/08/22 1631 08/08/22 2143 08/09/22 0742  GLUCAP 135* 121* 147* 110* 150*   Lipid Profile: No results for input(s): "CHOL", "HDL", "LDLCALC", "TRIG", "CHOLHDL", "LDLDIRECT" in the last 72 hours. Thyroid Function Tests: No results for input(s): "TSH", "T4TOTAL", "FREET4", "T3FREE", "THYROIDAB" in the last 72 hours. Anemia Panel: No results for input(s): "VITAMINB12", "FOLATE", "FERRITIN", "TIBC", "IRON", "RETICCTPCT" in the last 72 hours. Sepsis Labs: Recent Labs  Lab 08/06/22 2126 08/07/22 0019  LATICACIDVEN 0.8 1.0    Recent Results (from the past 240 hour(s))  Blood culture (routine x 2)     Status: None (Preliminary result)   Collection Time: 08/07/22 12:30 AM   Specimen: BLOOD  Result Value Ref Range Status   Specimen Description   Final    BLOOD BLOOD LEFT FOREARM Performed at [facility] [address], [place], [place] [address]    Special Requests   Final    BOTTLES DRAWN AEROBIC AND ANAEROBIC Blood Culture results may not be optimal due to an excessive volume of blood received in culture bottles Performed at [facility] [address], [place], [place] [address]    Culture   Final    NO GROWTH 2 DAYS Performed at [facility] [address], [place], [place] [address]    Report Status PENDING  Incomplete  Urine Culture     Status: Abnormal   Collection Time: 08/07/22  2:53 AM   Specimen: Urine, Clean Catch  Result Value Ref Range Status   Specimen Description   Final    URINE, CLEAN CATCH Performed at [facility], [place] [address], [place], [place] [address]    Special Requests   Final    NONE Performed at [facility] [place] [address] [place], [place] [address]    Culture (A)  Final    <10,000 COLONIES/mL INSIGNIFICANT GROWTH Performed at [facility]  [address] [place], [place] [address]    Report Status 08/08/2022 FINAL  Final  Blood culture (routine x 2)     Status: None (Preliminary result)   Collection Time: 08/08/22  3:59 AM   Specimen: BLOOD  Result Value Ref Range Status   Specimen Description   Final    BLOOD RIGHT ANTECUBITAL Performed at [facility] [address], [place], [place] [address]    Special Requests   Final    BOTTLES DRAWN AEROBIC ONLY Blood Culture results may not be optimal due to an inadequate volume of blood received in culture bottles Performed at [facility] [address], [place], [place] [address]    Culture   Final    NO GROWTH 1 DAY Performed at [facility], [place] [address] [place], [place] [address]    Report Status PENDING  Incomplete  Radiology Studies: No results found.      Scheduled Meds:  heparin  5,000 Units Subcutaneous Q8H   insulin aspart  0-5 Units Subcutaneous QHS   insulin aspart  0-9 Units Subcutaneous TID WC   iohexol       Continuous Infusions:  ceFEPime (MAXIPIME) IV 2 g (08/09/22 0905)     LOS: 2 days    Time spent: 35 minutes    Barb Merino, MD [facility] Pager [phone]

## 2022-08-10 ENCOUNTER — Inpatient Hospital Stay (HOSPITAL_COMMUNITY): Payer: Self-pay

## 2022-08-10 LAB — GLUCOSE, CAPILLARY
Glucose-Capillary: 120 mg/dL — ABNORMAL HIGH (ref 70–99)
Glucose-Capillary: 100 mg/dL — ABNORMAL HIGH (ref 70–99)
Glucose-Capillary: 108 mg/dL — ABNORMAL HIGH (ref 70–99)
Glucose-Capillary: 236 mg/dL — ABNORMAL HIGH (ref 70–99)

## 2022-08-10 MED ORDER — SODIUM CHLORIDE 0.9 % IV SOLN
INTRAVENOUS | Status: AC
  Filled 2022-08-10: qty 500

## 2022-08-10 MED ORDER — FENTANYL CITRATE (PF) 100 MCG/2ML IJ SOLN
INTRAMUSCULAR | Status: AC | PRN
  Administered 2022-08-10 (×4): 50 ug via INTRAVENOUS

## 2022-08-10 MED ORDER — MIDAZOLAM HCL 2 MG/2ML IJ SOLN
INTRAMUSCULAR | Status: AC
  Filled 2022-08-10: qty 4

## 2022-08-10 MED ORDER — FENTANYL CITRATE (PF) 100 MCG/2ML IJ SOLN
INTRAMUSCULAR | Status: AC
  Filled 2022-08-10: qty 2

## 2022-08-10 MED ORDER — MIDAZOLAM HCL 2 MG/2ML IJ SOLN
INTRAMUSCULAR | Status: AC
  Filled 2022-08-10: qty 2

## 2022-08-10 MED ORDER — SODIUM CHLORIDE 0.9 % IV SOLN
INTRAVENOUS | Status: AC
  Filled 2022-08-10: qty 250

## 2022-08-10 MED ORDER — MIDAZOLAM HCL 2 MG/2ML IJ SOLN
INTRAMUSCULAR | Status: AC | PRN
  Administered 2022-08-10 (×2): 1 mg via INTRAVENOUS
  Administered 2022-08-10: 2 mg via INTRAVENOUS
  Administered 2022-08-10 (×2): 1 mg via INTRAVENOUS

## 2022-08-10 NOTE — Progress Notes (Signed)
PROGRESS NOTE    Kimberly Herman  QPR:[id number] DOB: 1966/06/03 DOA: 08/06/2022 PCP: Bo Merino I, NP    Brief Narrative:  56 y.o. female with medical history significant of type 2 diabetes, hypertension, recent extensive history of infected stones of the kidneys and status post left nephrectomy 1 and half months ago presents to the emergency room with about 3 days of not feeling well, right flank pain, patient was found to have left perinephric/abscess and admitted to the hospital with IR consultation.  Assessment & Plan:   1.  Left-sided perinephric/renal fossa abscess.   Blood cultures negative.  Urine culture with insignificant growth.  Currently on cefepime with previous history of Citrobacter. Clinically improving.  Continues to have fever.   CT scan with contrast on follow-up studies shows abscess.   IR for drainage /percutaneous tube placement today.   Continue IV fluids and IV antibiotics.  Adequate pain medications.  Mobility.   2.  Type 2 diabetes: Well-controlled.  On insulin at home.  On insulin.   3.  Essential hypertension: Blood pressures are stable.  Holding antihypertensives.   4.  Hyperlipidemia: Hold statin.   Case discussed with interventional radiology.  Plan for drainage today.  Hopefully she can go home tomorrow if clinically stable.   DVT prophylaxis: heparin injection 5,000 Units Start: 08/10/22 2200 SCDs Start: 08/07/22 0932   Code Status: Full code Family Communication: None Disposition Plan: Status is: Inpatient Remains inpatient appropriate because: IV antibiotics, IV fluids, inpatient procedures anticipated     Consultants:  Interventional radiology  Procedures:  None  Antimicrobials:  Cefepime 9/16---   Subjective: Patient seen and examined.  She did have temperature 100.7 last night.  Feels sweaty.  No other complaints.  Pain is controlled.  N.p.o. for procedure today.  Objective: Vitals:   08/08/22 1147 08/08/22 2142  08/09/22 0612 08/10/22 0600  BP: (!) 142/80 (!) 165/71 (!) 154/75 (!) 158/82  Pulse: 80 70 74   Resp: '18 17 17 16  '$ Temp: 98.8 F (37.1 C) (!) 100.7 F (38.2 C) 98 F (36.7 C) 99.2 F (37.3 C)  TempSrc: Oral Oral Oral Oral  SpO2: 99% 98% 100%   Weight:    81.2 kg  Height:        Intake/Output Summary (Last 24 hours) at 08/10/2022 1037 Last data filed at 08/10/2022 0900 Gross per 24 hour  Intake 0 ml  Output --  Net 0 ml   Filed Weights   08/06/22 2113 08/10/22 0600  Weight: 81.6 kg 81.2 kg    Examination: General: Looks fairly comfortable on room air.  Not in any distress.  Walking around. Cardiovascular: S1-S2 normal.  Regular rate rhythm. Respiratory: Bilateral clear.  No added sounds. Gastrointestinal: Soft.  Nontender.  She does have mild tenderness about the left flank with no palpable swelling.  No drainage.  Ext: No deformities. Neuro: Intact.     Data Reviewed: I have personally reviewed following labs and imaging studies  CBC: Recent Labs  Lab 08/06/22 2126 08/08/22 0359 08/09/22 0353  WBC 10.3 7.7 6.4  NEUTROABS 7.7  --  4.2  HGB 10.3* 8.6* 9.3*  HCT 30.5* 26.0* 28.9*  MCV 85.9 88.1 88.9  PLT 329 236 993   Basic Metabolic Panel: Recent Labs  Lab 08/06/22 2126 08/08/22 0359 08/09/22 0353  NA 138 144 142  K 3.8 4.1 3.7  CL 105 111 106  CO2 '25 27 28  '$ GLUCOSE 160* 149* 132*  BUN '15 10 8  '$ CREATININE  1.07* 1.17* 0.84  CALCIUM 8.9 8.6* 8.8*   GFR: Estimated Creatinine Clearance: 78.5 mL/min (by C-G formula based on SCr of 0.84 mg/dL). Liver Function Tests: Recent Labs  Lab 08/06/22 2126  AST 20  ALT 18  ALKPHOS 68  BILITOT 0.6  PROT 8.4*  ALBUMIN 3.5   No results for input(s): "LIPASE", "AMYLASE" in the last 168 hours. No results for input(s): "AMMONIA" in the last 168 hours. Coagulation Profile: Recent Labs  Lab 08/09/22 0855  INR 1.2   Cardiac Enzymes: No results for input(s): "CKTOTAL", "CKMB", "CKMBINDEX", "TROPONINI" in  the last 168 hours. BNP (last 3 results) No results for input(s): "PROBNP" in the last 8760 hours. HbA1C: No results for input(s): "HGBA1C" in the last 72 hours. CBG: Recent Labs  Lab 08/09/22 0742 08/09/22 1257 08/09/22 1617 08/09/22 2242 08/10/22 0755  GLUCAP 150* 175* 144* 142* 120*   Lipid Profile: No results for input(s): "CHOL", "HDL", "LDLCALC", "TRIG", "CHOLHDL", "LDLDIRECT" in the last 72 hours. Thyroid Function Tests: No results for input(s): "TSH", "T4TOTAL", "FREET4", "T3FREE", "THYROIDAB" in the last 72 hours. Anemia Panel: No results for input(s): "VITAMINB12", "FOLATE", "FERRITIN", "TIBC", "IRON", "RETICCTPCT" in the last 72 hours. Sepsis Labs: Recent Labs  Lab 08/06/22 2126 08/07/22 0019  LATICACIDVEN 0.8 1.0    Recent Results (from the past 240 hour(s))  Blood culture (routine x 2)     Status: None (Preliminary result)   Collection Time: 08/07/22 12:30 AM   Specimen: BLOOD  Result Value Ref Range Status   Specimen Description   Final    BLOOD BLOOD LEFT FOREARM Performed at [facility] [address], [place], [place] [address]    Special Requests   Final    BOTTLES DRAWN AEROBIC AND ANAEROBIC Blood Culture results may not be optimal due to an excessive volume of blood received in culture bottles Performed at [facility] [address] [place], [place] [address]    Culture   Final    NO GROWTH 3 DAYS Performed at [facility], [place] [address] [place], [place] [address]    Report Status PENDING  Incomplete  Urine Culture     Status: Abnormal   Collection Time: 08/07/22  2:53 AM   Specimen: Urine, Clean Catch  Result Value Ref Range Status   Specimen Description   Final    URINE, CLEAN CATCH Performed at [facility], [place] [address] [place], [place] [address]    Special Requests   Final    NONE Performed at [facility], [place] [address] [place], [place]  [address]    Culture (A)  Final    <10,000 COLONIES/mL INSIGNIFICANT GROWTH Performed at [facility] [address], [place], [place] [address]    Report Status 08/08/2022 FINAL  Final  Blood culture (routine x 2)     Status: None (Preliminary result)   Collection Time: 08/08/22  3:59 AM   Specimen: BLOOD  Result Value Ref Range Status   Specimen Description   Final    BLOOD RIGHT ANTECUBITAL Performed at [facility] [address] [place], [place] [address]    Special Requests   Final    BOTTLES DRAWN AEROBIC ONLY Blood Culture results may not be optimal due to an inadequate volume of blood received in culture bottles Performed at [facility] [address] [place], [place] [address]    Culture   Final    NO  GROWTH 2 DAYS Performed at [facility], [place] [address], [place], [place] [address]    Report Status PENDING  Incomplete         Radiology Studies: CT ABDOMEN PELVIS W CONTRAST  Result Date: 08/09/2022 CLINICAL DATA:  Abdominal abscess (Ped 0-17y) perinephric abscess , persistent fever . study with iv contrast to look for abscess EXAM: CT ABDOMEN AND PELVIS WITH CONTRAST TECHNIQUE: Multidetector CT imaging of the abdomen and pelvis was performed using the standard protocol following bolus administration of intravenous contrast. RADIATION DOSE REDUCTION: This exam was performed according to the departmental dose-optimization program which includes automated exposure control, adjustment of the mA and/or kV according to patient size and/or use of iterative reconstruction technique. CONTRAST:  166m OMNIPAQUE IOHEXOL 300 MG/ML  SOLN COMPARISON:  08/06/2022 FINDINGS: Lower chest: Minimal left base atelectasis. No confluent opacity on the right. No effusions. Hepatobiliary: Diffuse low-density throughout the liver compatible with fatty infiltration. No focal abnormality. Gallbladder unremarkable. Pancreas: No focal abnormality or  ductal dilatation. Spleen: No focal abnormality.  Normal size. Adrenals/Urinary Tract: Status post left nephrectomy. Ill-defined low-density area within the left psoas muscle with peripheral enhancement measuring 5.4 x 2.3 cm concerning for abscess. Surrounding retroperitoneal stranding. No stones or hydronephrosis or mass in the right kidney. Adrenal glands unremarkable. Urinary bladder decompressed, grossly unremarkable. Stomach/Bowel: Normal appendix. Stomach, large and small bowel grossly unremarkable. Vascular/Lymphatic: No evidence of aneurysm or adenopathy. Reproductive: Lobular uterus compatible with fibroids. No adnexal mass. Other: No free fluid or free air. Musculoskeletal: No acute bony abnormality. IMPRESSION: Complex somewhat ill-defined fluid collection with peripheral enhancement in the left psoas muscle/retroperitoneum concerning for abscess. Status post left nephrectomy. Hepatic steatosis. Fibroid uterus. Electronically Signed   By: KRolm BaptiseM.D.   On: 08/09/2022 13:06        Scheduled Meds:  heparin  5,000 Units Subcutaneous Q8H   insulin aspart  0-5 Units Subcutaneous QHS   insulin aspart  0-9 Units Subcutaneous TID WC   Continuous Infusions:  ceFEPime (MAXIPIME) IV 2 g (08/10/22 0119)     LOS: 3 days    Time spent: 35 minutes    KBarb Merino MD [facility] Pager [phone]

## 2022-08-10 NOTE — Plan of Care (Signed)
  Problem: Education: Goal: Knowledge of General Education information will improve Description Including pain rating scale, medication(s)/side effects and non-pharmacologic comfort measures Outcome: Progressing   Problem: Health Behavior/Discharge Planning: Goal: Ability to manage health-related needs will improve Outcome: Progressing   

## 2022-08-10 NOTE — Plan of Care (Signed)

## 2022-08-10 NOTE — Procedures (Signed)
Interventional Radiology Procedure:   Indications: Left retroperitoneal abscess  Procedure: CT guided drain  Findings: 10 Fr drain placed, 15 ml of bloody purulent fluid removed. Attached to suction bulb.  Complications: No immediate complications noted.     EBL: Minimal  Plan: Send fluid for culture.     Jozlin Bently R. Anselm Pancoast, MD  Pager: [phone]

## 2022-08-11 DIAGNOSIS — E119 Type 2 diabetes mellitus without complications: Secondary | ICD-10-CM

## 2022-08-11 DIAGNOSIS — I10 Essential (primary) hypertension: Secondary | ICD-10-CM

## 2022-08-11 DIAGNOSIS — E785 Hyperlipidemia, unspecified: Secondary | ICD-10-CM

## 2022-08-11 LAB — GLUCOSE, CAPILLARY
Glucose-Capillary: 119 mg/dL — ABNORMAL HIGH (ref 70–99)
Glucose-Capillary: 126 mg/dL — ABNORMAL HIGH (ref 70–99)
Glucose-Capillary: 138 mg/dL — ABNORMAL HIGH (ref 70–99)

## 2022-08-11 MED ORDER — SODIUM CHLORIDE 0.9% FLUSH
5.0000 mL | Freq: Three times a day (TID) | INTRAVENOUS | Status: DC
  Administered 2022-08-11 – 2022-08-12 (×4): 5 mL

## 2022-08-11 NOTE — Plan of Care (Signed)
  Problem: Education: Goal: Knowledge of General Education information will improve Description Including pain rating scale, medication(s)/side effects and non-pharmacologic comfort measures Outcome: Progressing   Problem: Health Behavior/Discharge Planning: Goal: Ability to manage health-related needs will improve Outcome: Progressing   

## 2022-08-11 NOTE — Progress Notes (Signed)
PROGRESS NOTE    Kimberly Herman  ZHG:[id number] DOB: [birth year] DOA: 08/06/2022 PCP: Bo Merino I, NP    Chief Complaint  Patient presents with   Flank Pain    Brief Narrative:  56 y.o. female with medical history significant of type 2 diabetes, hypertension, recent extensive history of infected stones of the kidneys and status post left nephrectomy 1 and half months ago presents to the emergency room with about 3 days of not feeling well, right flank pain, patient was found to have left perinephric/abscess and admitted to the hospital with IR consultation.  Assessment & Plan:   Principal Problem:   Renal abscess, left Active Problems:   T2DM (type 2 diabetes mellitus) (HCC)   HTN (hypertension)   Hyperlipidemia  Left sided perinephric / renal fossa abscess S/p drain placement by Dr. Anselm Pancoast on 9/19 - 15 mL bloody purulent fluid aspirated  Fluid sent for cultures and pending.  Urine and blood cultures are negative.  IV CEFEPIME to continue.  Recommend to wait another 24 hours for the culture report prior to discharge.  Continue with pain control.  Afebrile this morning.    Type 2 DM CBG (last 3)  Recent Labs    08/10/22 1629 08/10/22 2142 08/11/22 0754  GLUCAP 108* 236* 119*   Resume SSI.  Insulin dependent.    Hypertension:  Bp parameters are optimal.    Hyperlipidemia:  Resume statin on discharge.     DVT prophylaxis:Heparin.  Code Status: (Full/Partial - specify details) Family Communication: none at bedside.  Disposition:   Status is: Inpatient Remains inpatient appropriate because: IV antibiotics.    Level of care: Med-Surg Consultants:  IR   Procedures: S/p drain placement by Dr. Anselm Pancoast on 9/19 - 15 mL bloody purulent fluid aspirated   Antimicrobials: IV cefepime.    Subjective: Pain is improving.   Objective: Vitals:   08/10/22 1628 08/10/22 2144 08/11/22 0500 08/11/22 0556  BP: 121/67 125/70  (!) 142/74  Pulse: 77 72  (!) 59   Resp: '16 16  18  '$ Temp: 98.3 F (36.8 C) 98 F (36.7 C)  97.9 F (36.6 C)  TempSrc: Oral Oral  Oral  SpO2: 97% 98%  98%  Weight:   81.4 kg   Height:        Intake/Output Summary (Last 24 hours) at 08/11/2022 1054 Last data filed at 08/11/2022 1024 Gross per 24 hour  Intake 676.61 ml  Output 50 ml  Net 626.61 ml   Filed Weights   08/06/22 2113 08/10/22 0600 08/11/22 0500  Weight: 81.6 kg 81.2 kg 81.4 kg    Examination:  General exam: Appears calm and comfortable  Respiratory system: Clear to auscultation. Respiratory effort normal. Cardiovascular system: S1 & S2 heard, RRR. No JVD, No pedal edema. Gastrointestinal system: Abdomen is nondistended, soft and nontender. IR  drain placed  Central nervous system: Alert and oriented. No focal neurological deficits. Extremities: Symmetric 5 x 5 power. Skin: No rashes, lesions or ulcers Psychiatry: Mood & affect appropriate.     Data Reviewed: I have personally reviewed following labs and imaging studies  CBC: Recent Labs  Lab 08/06/22 2126 08/08/22 0359 08/09/22 0353  WBC 10.3 7.7 6.4  NEUTROABS 7.7  --  4.2  HGB 10.3* 8.6* 9.3*  HCT 30.5* 26.0* 28.9*  MCV 85.9 88.1 88.9  PLT 329 236 196    Basic Metabolic Panel: Recent Labs  Lab 08/06/22 2126 08/08/22 0359 08/09/22 0353  NA 138 144 142  K 3.8  4.1 3.7  CL 105 111 106  CO2 '25 27 28  '$ GLUCOSE 160* 149* 132*  BUN '15 10 8  '$ CREATININE 1.07* 1.17* 0.84  CALCIUM 8.9 8.6* 8.8*    GFR: Estimated Creatinine Clearance: 78.5 mL/min (by C-G formula based on SCr of 0.84 mg/dL).  Liver Function Tests: Recent Labs  Lab 08/06/22 2126  AST 20  ALT 18  ALKPHOS 68  BILITOT 0.6  PROT 8.4*  ALBUMIN 3.5    CBG: Recent Labs  Lab 08/10/22 0755 08/10/22 1156 08/10/22 1629 08/10/22 2142 08/11/22 0754  GLUCAP 120* 100* 108* 236* 119*     Recent Results (from the past 240 hour(s))  Blood culture (routine x 2)     Status: None (Preliminary result)   Collection  Time: 08/07/22 12:30 AM   Specimen: BLOOD  Result Value Ref Range Status   Specimen Description   Final    BLOOD BLOOD LEFT FOREARM Performed at [facility] [address] [place], [place] [address]    Special Requests   Final    BOTTLES DRAWN AEROBIC AND ANAEROBIC Blood Culture results may not be optimal due to an excessive volume of blood received in culture bottles Performed at [facility] [address] [place], [place] [address]    Culture   Final    NO GROWTH 4 DAYS Performed at [facility], [place] [address], [place], [place] [address]    Report Status PENDING  Incomplete  Urine Culture     Status: Abnormal   Collection Time: 08/07/22  2:53 AM   Specimen: Urine, Clean Catch  Result Value Ref Range Status   Specimen Description   Final    URINE, CLEAN CATCH Performed at [facility], [place] [address], [place], [place] [address]    Special Requests   Final    NONE Performed at [facility], [place] [address] [place], [place] [address]    Culture (A)  Final    <10,000 COLONIES/mL INSIGNIFICANT GROWTH Performed at [facility] [address] [place], [place] [address]    Report Status 08/08/2022 FINAL  Final  Blood culture (routine x 2)     Status: None (Preliminary result)   Collection Time: 08/08/22  3:59 AM   Specimen: BLOOD  Result Value Ref Range Status   Specimen Description   Final    BLOOD RIGHT ANTECUBITAL Performed at [facility] [address] [place], [place] [address]    Special Requests   Final    BOTTLES DRAWN AEROBIC ONLY Blood Culture results may not be optimal due to an inadequate volume of blood received in culture bottles Performed at [facility] [address], [place], [place] [address]    Culture   Final    NO GROWTH 3 DAYS Performed at [facility], [place] [address] [place], [place] [address]    Report  Status PENDING  Incomplete  Aerobic/Anaerobic Culture w Gram Stain (surgical/deep wound)     Status: None (Preliminary result)   Collection Time: 08/10/22  4:00 PM   Specimen: Abscess  Result Value Ref Range Status   Specimen Description   Final    ABSCESS Performed at [facility] [address], [place], [place] [address]    Special Requests   Final    NONE Performed at [facility], [place] [address], [place],  [address]    Gram Stain  Final    ABUNDANT WBC PRESENT,BOTH PMN AND MONONUCLEAR NO ORGANISMS SEEN    Culture   Final    NO GROWTH < 12 HOURS Performed at [facility] [address], [place], [place] [address]    Report Status PENDING  Incomplete         Radiology Studies: CT GUIDED PERITONEAL/RETROPERITONEAL FLUID DRAIN BY PERC CATH  Result Date: 08/10/2022 INDICATION: 57 year old with history of left nephrectomy and previous left retroperitoneal abscess. Patient now has a complex fluid collection involving the left psoas and posterior left abdominal wall. EXAM: CT-GUIDED PLACEMENT OF A DRAINAGE CATHETER IN THE LEFT RETROPERITONEAL ABSCESS MEDICATIONS: Moderate sedation ANESTHESIA/SEDATION: Moderate (conscious) sedation was employed during this procedure. A total of Versed 6.'0mg'$  and fentanyl 200 mcg was administered intravenously at the order of the provider performing the procedure. Total intra-service moderate sedation time: 48 minutes. Patient's level of consciousness and vital signs were monitored continuously by radiology nurse throughout the procedure under the supervision of the provider performing the procedure. COMPLICATIONS: None immediate. PROCEDURE: Informed written consent was obtained from the patient after a thorough discussion of the procedural risks, benefits and alternatives. All questions were addressed. Maximal Sterile Barrier Technique was utilized including caps, mask, sterile gowns, sterile gloves,  sterile drape, hand hygiene and skin antiseptic. A timeout was performed prior to the initiation of the procedure. Patient was placed prone. CT images through the abdomen were obtained. The abnormal area involving the posterior left abdominal wall and left psoas was identified and targeted. The overlying skin was prepped with chlorhexidine. Sterile field was created. Skin was anesthetized using 1% lidocaine. Small incision was made. Using CT guidance, an 18 gauge trocar needle was directed into the left psoas muscle. Initially, no significant fluid could be aspirated. Second small incision was made and the needle was directed slightly more anteriorly. Small amount of bloody purulent fluid was eventually aspirated. Superstiff Amplatz wire was placed and follow up CT images confirmed placement within the complex collection. The tract was dilated to accommodate a 10 [place] multipurpose drain. Approximately 15 mL of bloody purulent fluid was removed. Drain was sutured to skin and attached to a suction bulb. Dressing was placed. RADIATION DOSE REDUCTION: This exam was performed according to the departmental dose-optimization program which includes automated exposure control, adjustment of the mA and/or kV according to patient size and/or use of iterative reconstruction technique. FINDINGS: Complex collection involving the left psoas muscle. Drain was successfully placed within the collection and 15 mL of bloody purulent fluid was removed. IMPRESSION: CT-guided placement of a drainage catheter in the left retroperitoneal abscess. Electronically Signed   By: Markus Daft M.D.   On: 08/10/2022 17:19   CT ABDOMEN PELVIS W CONTRAST  Result Date: 08/09/2022 CLINICAL DATA:  Abdominal abscess (Ped 0-17y) perinephric abscess , persistent fever . study with iv contrast to look for abscess EXAM: CT ABDOMEN AND PELVIS WITH CONTRAST TECHNIQUE: Multidetector CT imaging of the abdomen and pelvis was performed using the standard  protocol following bolus administration of intravenous contrast. RADIATION DOSE REDUCTION: This exam was performed according to the departmental dose-optimization program which includes automated exposure control, adjustment of the mA and/or kV according to patient size and/or use of iterative reconstruction technique. CONTRAST:  148m OMNIPAQUE IOHEXOL 300 MG/ML  SOLN COMPARISON:  08/06/2022 FINDINGS: Lower chest: Minimal left base atelectasis. No confluent opacity on the right. No effusions. Hepatobiliary: Diffuse low-density throughout the liver compatible with fatty infiltration. No focal abnormality. Gallbladder unremarkable. Pancreas: No  focal abnormality or ductal dilatation. Spleen: No focal abnormality.  Normal size. Adrenals/Urinary Tract: Status post left nephrectomy. Ill-defined low-density area within the left psoas muscle with peripheral enhancement measuring 5.4 x 2.3 cm concerning for abscess. Surrounding retroperitoneal stranding. No stones or hydronephrosis or mass in the right kidney. Adrenal glands unremarkable. Urinary bladder decompressed, grossly unremarkable. Stomach/Bowel: Normal appendix. Stomach, large and small bowel grossly unremarkable. Vascular/Lymphatic: No evidence of aneurysm or adenopathy. Reproductive: Lobular uterus compatible with fibroids. No adnexal mass. Other: No free fluid or free air. Musculoskeletal: No acute bony abnormality. IMPRESSION: Complex somewhat ill-defined fluid collection with peripheral enhancement in the left psoas muscle/retroperitoneum concerning for abscess. Status post left nephrectomy. Hepatic steatosis. Fibroid uterus. Electronically Signed   By: Rolm Baptise M.D.   On: 08/09/2022 13:06        Scheduled Meds:  heparin  5,000 Units Subcutaneous Q8H   insulin aspart  0-5 Units Subcutaneous QHS   insulin aspart  0-9 Units Subcutaneous TID WC   sodium chloride flush  5 mL Intracatheter Q8H   Continuous Infusions:  ceFEPime (MAXIPIME) IV 2 g  (08/11/22 1024)     LOS: 4 days    Time spent: 38 minutes.     Hosie Poisson, MD [facility]   To contact the attending provider between 7A-7P or the covering provider during after hours 7P-7A, please log into the web site [email or web address] and access using universal [place] password for that web site. If you do not have the password, please call the hospital operator.  08/11/2022, 10:54 AM

## 2022-08-11 NOTE — Progress Notes (Signed)
Mobility Specialist - Progress Note   08/11/22 1600  Mobility  HOB Elevated/Bed Position Self regulated  Activity Ambulated independently in hallway  Range of Motion/Exercises Active  Level of Assistance Independent  Assistive Device None  Distance Ambulated (ft) 350 ft  Activity Response Tolerated well  $Mobility charge 1 Mobility   Pt was found at EOB and agreeable to ambulate. Pt had no complaints and returned to bed with all necessities in reach.  Ferd Hibbs Mobility Specialist

## 2022-08-11 NOTE — Progress Notes (Signed)
Referring Physician(s): Ghimire, K.  Supervising Physician: Arne Cleveland  Patient Status:  [facility] - In-pt  Chief Complaint:  left nephrectomy bed fluid collection concerning for abscess S/p drain placement by Dr. Anselm Pancoast on 9/19 - 15 mL bloody purulent fluid aspirated   Subjective:  Patient sitting on the side of the bed, eating breakfast.  Reports that she id going better today, denies abdominal pain, n/v.   Allergies: Amlodipine, Bactrim [sulfamethoxazole-trimethoprim], and Carvedilol  Medications: Prior to Admission medications   Medication Sig Start Date End Date Taking? Authorizing Provider  atorvastatin (LIPITOR) 40 MG tablet Take 1 tablet (40 mg total) by mouth daily. 05/19/22 08/17/22 Yes Fenton Foy, NP  candesartan (ATACAND) 32 MG tablet Take 1 tablet (32 mg total) by mouth daily. 03/17/22 03/17/23 Yes Passmore, Jake Church I, NP  chlorthalidone (HYGROTON) 25 MG tablet Take 1 tablet (25 mg total) by mouth daily. 05/19/22 11/15/22 Yes Fenton Foy, NP  insulin detemir (LEVEMIR) 100 UNIT/ML injection Inject 10 Units into the skin at bedtime.   Yes [provider]  blood glucose meter kit and supplies KIT Dispense based on patient and insurance preference. Use up to four times daily as directed. (FOR ICD-9 250.00, 250.01). 08/29/19   Azzie Glatter, FNP  Blood Pressure Monitoring (BLOOD PRESSURE CUFF) MISC Check blood pressure once daily at the same time and record readings 03/07/17   Scot Jun, FNP  glucose blood (TRUE METRIX BLOOD GLUCOSE TEST) test strip TEST UP TO FOUR TIMES DAILY 05/11/17   Scot Jun, FNP  insulin detemir (LEVEMIR) 100 UNIT/ML injection Inject 0.1 mLs (10 Units total) into the skin at bedtime. Patient taking differently: Inject 10 Units into the skin at bedtime as needed (for blood glucose greater than 240). 10/20/21 01/18/22  Bo Merino I, NP  omeprazole (PRILOSEC) 20 MG capsule Take 1 capsule (20 mg total) by mouth  daily. Patient not taking: Reported on 05/19/2022 10/19/21   Bo Merino I, NP  tamsulosin (FLOMAX) 0.4 MG CAPS capsule Take 1 capsule (0.4 mg total) by mouth daily for 14 days. Patient not taking: Reported on 05/19/2022 04/08/22        Vital Signs: BP (!) 142/74 (BP Location: Left Arm)   Pulse (!) 59   Temp 97.9 F (36.6 C) (Oral)   Resp 18   Ht _0  (1.676 m)   Wt 179 lb 8.3 oz (81.4 kg)   SpO2 98%   BMI 28.98 kg/m   Physical Exam Vitals reviewed.  Constitutional:      General: She is not in acute distress.    Appearance: Normal appearance. She is not ill-appearing.  HENT:     Head: Normocephalic.  Pulmonary:     Effort: Pulmonary effort is normal.  Skin:    General: Skin is warm and dry.     Coloration: Skin is not jaundiced or pale.     Comments: Positive LRP drain to a suction bulb. Site is unremarkable with no erythema, edema, tenderness, bleeding or drainage. Suture and stat lock in place. Dressing is clean, dry, and intact. 5 ml of  bloody fluid noted in the bulb. Drain flushed well, aspirate with resistance.   Neurological:     Mental Status: She is alert and oriented to person, place, and time.  Psychiatric:        Mood and Affect: Mood normal.        Behavior: Behavior normal.     Imaging: CT GUIDED PERITONEAL/RETROPERITONEAL FLUID DRAIN BY  PERC CATH  Result Date: 08/10/2022 INDICATION: 56 year old with history of left nephrectomy and previous left retroperitoneal abscess. Patient now has a complex fluid collection involving the left psoas and posterior left abdominal wall. EXAM: CT-GUIDED PLACEMENT OF A DRAINAGE CATHETER IN THE LEFT RETROPERITONEAL ABSCESS MEDICATIONS: Moderate sedation ANESTHESIA/SEDATION: Moderate (conscious) sedation was employed during this procedure. A total of Versed 6.100m and fentanyl 200 mcg was administered intravenously at the order of the provider performing the procedure. Total intra-service moderate sedation time: 48 minutes.  Patient's level of consciousness and vital signs were monitored continuously by radiology nurse throughout the procedure under the supervision of the provider performing the procedure. COMPLICATIONS: None immediate. PROCEDURE: Informed written consent was obtained from the patient after a thorough discussion of the procedural risks, benefits and alternatives. All questions were addressed. Maximal Sterile Barrier Technique was utilized including caps, mask, sterile gowns, sterile gloves, sterile drape, hand hygiene and skin antiseptic. A timeout was performed prior to the initiation of the procedure. Patient was placed prone. CT images through the abdomen were obtained. The abnormal area involving the posterior left abdominal wall and left psoas was identified and targeted. The overlying skin was prepped with chlorhexidine. Sterile field was created. Skin was anesthetized using 1% lidocaine. Small incision was made. Using CT guidance, an 18 gauge trocar needle was directed into the left psoas muscle. Initially, no significant fluid could be aspirated. Second small incision was made and the needle was directed slightly more anteriorly. Small amount of bloody purulent fluid was eventually aspirated. Superstiff Amplatz wire was placed and follow up CT images confirmed placement within the complex collection. The tract was dilated to accommodate a 10 [place]. Approximately 15 mL of bloody purulent fluid was removed. Drain was sutured to skin and attached to a suction bulb. Dressing was placed. RADIATION DOSE REDUCTION: This exam was performed according to the departmental dose-optimization program which includes automated exposure control, adjustment of the mA and/or kV according to patient size and/or use of iterative reconstruction technique. FINDINGS: Complex collection involving the left psoas muscle. Drain was successfully placed within the collection and 15 mL of bloody purulent fluid was removed.  IMPRESSION: CT-guided placement of a drainage catheter in the left retroperitoneal abscess. Electronically Signed   By: AMarkus DaftM.D.   On: 08/10/2022 17:19   CT ABDOMEN PELVIS W CONTRAST  Result Date: 08/09/2022 CLINICAL DATA:  Abdominal abscess (Ped 0-17y) perinephric abscess , persistent fever . study with iv contrast to look for abscess EXAM: CT ABDOMEN AND PELVIS WITH CONTRAST TECHNIQUE: Multidetector CT imaging of the abdomen and pelvis was performed using the standard protocol following bolus administration of intravenous contrast. RADIATION DOSE REDUCTION: This exam was performed according to the departmental dose-optimization program which includes automated exposure control, adjustment of the mA and/or kV according to patient size and/or use of iterative reconstruction technique. CONTRAST:  1053mOMNIPAQUE IOHEXOL 300 MG/ML  SOLN COMPARISON:  08/06/2022 FINDINGS: Lower chest: Minimal left base atelectasis. No confluent opacity on the right. No effusions. Hepatobiliary: Diffuse low-density throughout the liver compatible with fatty infiltration. No focal abnormality. Gallbladder unremarkable. Pancreas: No focal abnormality or ductal dilatation. Spleen: No focal abnormality.  Normal size. Adrenals/Urinary Tract: Status post left nephrectomy. Ill-defined low-density area within the left psoas muscle with peripheral enhancement measuring 5.4 x 2.3 cm concerning for abscess. Surrounding retroperitoneal stranding. No stones or hydronephrosis or mass in the right kidney. Adrenal glands unremarkable. Urinary bladder decompressed, grossly unremarkable. Stomach/Bowel: Normal appendix. Stomach, large and small  bowel grossly unremarkable. Vascular/Lymphatic: No evidence of aneurysm or adenopathy. Reproductive: Lobular uterus compatible with fibroids. No adnexal mass. Other: No free fluid or free air. Musculoskeletal: No acute bony abnormality. IMPRESSION: Complex somewhat ill-defined fluid collection with  peripheral enhancement in the left psoas muscle/retroperitoneum concerning for abscess. Status post left nephrectomy. Hepatic steatosis. Fibroid uterus. Electronically Signed   By: Rolm Baptise M.D.   On: 08/09/2022 13:06    Labs:  CBC: Recent Labs    03/24/22 1601 08/06/22 2126 08/08/22 0359 08/09/22 0353  WBC 3.4* 10.3 7.7 6.4  HGB 12.5 10.3* 8.6* 9.3*  HCT 37.7 30.5* 26.0* 28.9*  PLT 258 329 236 274    COAGS: Recent Labs    03/05/22 0630 08/09/22 0855  INR 1.5* 1.2    BMP: Recent Labs    03/09/22 0434 03/24/22 1601 08/06/22 2126 08/08/22 0359 08/09/22 0353  NA 142 142 138 144 142  K 3.8 4.1 3.8 4.1 3.7  CL 105 105 105 111 106  CO2 _0 GLUCOSE 146* 212* 160* 149* 132*  BUN _1 CALCIUM 8.9 9.4 8.9 8.6* 8.8*  CREATININE 0.93 1.05* 1.07* 1.17* 0.84  GFRNONAA >60  --  >60 54* >60    LIVER FUNCTION TESTS: Recent Labs    03/05/22 0312 03/06/22 0528 03/09/22 0434 08/06/22 2126  BILITOT 1.3* 0.8 0.5 0.6  AST 14* _2 ALT _3 ALKPHOS 57 57 73 68  PROT 6.7 6.2* 7.3 8.4*  ALBUMIN 2.9* 2.6* 3.1* 3.5    Assessment and Plan:  56 y.o. female with extensive hx of infected kidney stone s/p left nephrectomy on 06/28/22  who presented with fluid collection in Lt nephrectomy bed, s/p aspiration and 10 Fr L RP drain placement by Dr. Anselm Pancoast on 9/19  No CBC today  VSS Cx pending  Drain functional today, output bloody   Drain Location: L RP Size: Fr size: 10 Fr Date of placement: 9/19  Currently to: Drain collection device: suction bulb 24 hour output:  Output by Drain (mL) 08/09/22 0701 - 08/09/22 1900 08/09/22 1901 - 08/10/22 0700 08/10/22 0701 - 08/10/22 1900 08/10/22 1901 - 08/11/22 0700 08/11/22 0701 - 08/11/22 0856  Requested LDAs do not have output data documented.    Interval imaging/drain manipulation:  none  Current examination: Flushes easily, aspirates with resistance.  Insertion site unremarkable. Suture and  stat lock in place. Dressed appropriately.   Plan:  Post procedural orders were not released yesterday. Released today and IR was informed.   Continue TID flushes with 5 cc NS. Record output Q shift. Dressing changes QD or PRN if soiled.  Call IR APP or on call IR MD if difficulty flushing or sudden change in drain output.  Repeat imaging/possible drain injection once output < 10 mL/QD (excluding flush material). Consideration for drain removal if output is < 10 mL/QD (excluding flush material), pending discussion with the providing surgical service.  Discharge planning: Please contact IR APP or on call IR MD prior to patient d/c to ensure appropriate follow up plans are in place. Typically patient will follow up with IR clinic 10-14 days post d/c for repeat imaging/possible drain injection. IR scheduler will contact patient with date/time of appointment. Patient will need to flush drain QD with 5 cc NS, record output QD, dressing changes every 2-3 days or earlier if soiled.   IR will continue to follow - please call with questions or concerns.  Electronically Signed: Tera Mater, PA-C 08/11/2022, 8:53 AM   I spent a total of 15 Minutes at the the patient's bedside AND on the patient's hospital floor or unit, greater than 50% of which was counseling/coordinating care for L RP drain f/u.   This chart was dictated using voice recognition software.  Despite best efforts to proofread,  errors can occur which can change the documentation meaning.

## 2022-08-11 NOTE — TOC Initial Note (Signed)
Transition of Care [facility]) - Initial/Assessment Note    Patient Details  Name: Kimberly Herman MRN: [id number] Date of Birth: 1965/12/29  Transition of Care (TOC) CM/SW Contact:    Joaquin Courts, RN Phone Number: 08/11/2022, 11:52 AM  Clinical Narrative:                 CM spoke with patient who reports that she is currently uninsured but in the process of applying for new insurance.  Reports that she has had a hard time affording her insulin, reports her previous plan did not cover that medication and she had to pay out of pocket.  CM discussed the Match program, notified patient that letter is valid for 7 days and program can only be utilized once per year.  A match letter was issued and delivered to bedside for an anticipated discharge tomorrow.  Patient provided with a list of participating pharmacies.  Patient has a pcp.  No further needs were identified.  Expected Discharge Plan: Home/Self Care Barriers to Discharge: Continued Medical Work up   Patient Goals and CMS Choice Patient states their goals for this hospitalization and ongoing recovery are:: to go home   Choice offered to / list presented to : NA  Expected Discharge Plan and Services Expected Discharge Plan: Home/Self Care       Living arrangements for the past 2 months: Single Family Home                                      Prior Living Arrangements/Services Living arrangements for the past 2 months: Single Family Home Lives with:: Self Patient language and need for interpreter reviewed:: Yes Do you feel safe going back to the place where you live?: Yes      Need for Family Participation in Patient Care: No (Comment) Care giver support system in place?: No (comment)   Criminal Activity/Legal Involvement Pertinent to Current Situation/Hospitalization: No - Comment as needed  Activities of Daily Living Home Assistive Devices/Equipment: None ADL Screening (condition at time of  admission) Patient's cognitive ability adequate to safely complete daily activities?: Yes Is the patient deaf or have difficulty hearing?: No Does the patient have difficulty seeing, even when wearing glasses/contacts?: No Does the patient have difficulty concentrating, remembering, or making decisions?: No Patient able to express need for assistance with ADLs?: No Does the patient have difficulty dressing or bathing?: No Independently performs ADLs?: Yes (appropriate for developmental age) Does the patient have difficulty walking or climbing stairs?: No Weakness of Legs: None Weakness of Arms/Hands: None  Permission Sought/Granted                  Emotional Assessment Appearance:: Appears stated age Attitude/Demeanor/Rapport: Engaged Affect (typically observed): Accepting Orientation: : Oriented to Self, Oriented to Place, Oriented to  Time, Oriented to Situation   Psych Involvement: No (comment)  Admission diagnosis:  Abscess, retroperitoneal ([place]) [K68.19] Renal abscess, left [N15.1] Cystitis [N30.90] Patient Active Problem List   Diagnosis Date Noted   Renal abscess, left 08/07/2022   Perinephric abscess 03/05/2022   Psoas abscess, left ([place]) 03/05/2022   Ureteral calculus, left 03/05/2022   Vaginal polyp 10/30/2019   Encounter for gynecological examination with Papanicolaou smear of cervix 10/30/2019   Elevated glucose 08/30/2019   Hemoglobin A1C between 7% and 9% indicating borderline diabetic control ([place]) 08/30/2019   Hyperlipidemia 12/27/2017   Shortness of breath 12/27/2017  T2DM (type 2 diabetes mellitus) ([place]) 03/07/2017   Hyperglycemia 03/07/2017   HTN (hypertension) 03/07/2017   ANEMIA, IRON DEFICIENCY, UNSPEC. 01/19/2007   PCP:  Teena Dunk, NP Pharmacy:   [id number] - [place], [address] AT [facility] OF [address] [place] [address] Phone: [phone] Fax: [place] [address], [place] [address] Phone: [phone] Fax: [phone]     Social Determinants of Health (SDOH) Interventions    Readmission Risk Interventions     No data to display

## 2022-08-11 NOTE — Progress Notes (Signed)
Pharmacy Antibiotic Note  Kimberly Herman is a 56 y.o. female with hx kidney stone and s/p left nephrectomy a few weeks PTA who presented to the ED on 08/06/2022 with fever and flank pain. Pharmacy was consulted to dose cefepime for suspected sepsis. Patient is s/p drain placement to left retroperitoneal abscess. Culture sent.  Plan: -Continue cefepime 2gm IV q8h -F/u cx from abscess  _______________________________________  Height: '5\' 6"'$  (167.6 cm) Weight: 81.4 kg (179 lb 8.3 oz) IBW/kg (Calculated) : 59.3  Temp (24hrs), Avg:98.3 F (36.8 C), Min:97.9 F (36.6 C), Max:98.9 F (37.2 C)  Recent Labs  Lab 08/06/22 2126 08/07/22 0019 08/08/22 0359 08/09/22 0353  WBC 10.3  --  7.7 6.4  CREATININE 1.07*  --  1.17* 0.84  LATICACIDVEN 0.8 1.0  --   --      Estimated Creatinine Clearance: 78.5 mL/min (by C-G formula based on SCr of 0.84 mg/dL).    Allergies  Allergen Reactions   Amlodipine Swelling   Bactrim [Sulfamethoxazole-Trimethoprim] Nausea Only    Extreme nausea and felt really bad   Carvedilol Diarrhea     Thank you for allowing pharmacy to be a part of this patient's care.  Tawnya Crook, PharmD, BCPS Clinical Pharmacist 08/11/2022 8:20 AM

## 2022-08-12 ENCOUNTER — Other Ambulatory Visit (HOSPITAL_BASED_OUTPATIENT_CLINIC_OR_DEPARTMENT_OTHER): Payer: Self-pay

## 2022-08-12 ENCOUNTER — Other Ambulatory Visit: Payer: Self-pay

## 2022-08-12 DIAGNOSIS — K6819 Other retroperitoneal abscess: Principal | ICD-10-CM

## 2022-08-12 LAB — BASIC METABOLIC PANEL
Anion gap: 5 (ref 5–15)
BUN: 17 mg/dL (ref 6–20)
CO2: 28 mmol/L (ref 22–32)
Calcium: 9 mg/dL (ref 8.9–10.3)
Chloride: 111 mmol/L (ref 98–111)
Creatinine, Ser: 0.96 mg/dL (ref 0.44–1.00)
GFR, Estimated: >60 mL/min (ref >60)
Glucose, Bld: 140 mg/dL — ABNORMAL HIGH (ref 70–99)
Potassium: 3.8 mmol/L (ref 3.5–5.1)
Sodium: 144 mmol/L (ref 135–145)

## 2022-08-12 LAB — CULTURE, BLOOD (ROUTINE X 2): Culture: NO GROWTH

## 2022-08-12 LAB — CBC WITH DIFFERENTIAL/PLATELET
Abs Immature Granulocytes: 0.01 10*3/uL (ref 0.00–0.07)
Basophils Absolute: 0.1 10*3/uL (ref 0.0–0.1)
Basophils Relative: 1 %
Eosinophils Absolute: 0.2 10*3/uL (ref 0.0–0.5)
Eosinophils Relative: 5 %
HCT: 28.4 % — ABNORMAL LOW (ref 36.0–46.0)
Hemoglobin: 9.3 g/dL — ABNORMAL LOW (ref 12.0–15.0)
Immature Granulocytes: 0 %
Lymphocytes Relative: 36 %
Lymphs Abs: 1.5 10*3/uL (ref 0.7–4.0)
MCH: 28.8 pg (ref 26.0–34.0)
MCHC: 32.7 g/dL (ref 30.0–36.0)
MCV: 87.9 fL (ref 80.0–100.0)
Monocytes Absolute: 0.3 10*3/uL (ref 0.1–1.0)
Monocytes Relative: 8 %
Neutro Abs: 2.2 10*3/uL (ref 1.7–7.7)
Neutrophils Relative %: 50 %
Platelets: 301 10*3/uL (ref 150–400)
RBC: 3.23 MIL/uL — ABNORMAL LOW (ref 3.87–5.11)
RDW: 13.4 % (ref 11.5–15.5)
WBC: 4.3 10*3/uL (ref 4.0–10.5)
nRBC: 0 % (ref 0.0–0.2)

## 2022-08-12 LAB — GLUCOSE, CAPILLARY
Glucose-Capillary: 151 mg/dL — ABNORMAL HIGH (ref 70–99)
Glucose-Capillary: 160 mg/dL — ABNORMAL HIGH (ref 70–99)

## 2022-08-12 MED ORDER — AMOXICILLIN-POT CLAVULANATE 875-125 MG PO TABS
1.0000 | ORAL_TABLET | Freq: Two times a day (BID) | ORAL | 0 refills | Status: AC
  Filled 2022-08-12: qty 28, 14d supply, fill #0

## 2022-08-12 MED ORDER — CANDESARTAN CILEXETIL 32 MG PO TABS
32.0000 mg | ORAL_TABLET | Freq: Every day | ORAL | 2 refills | Status: DC
  Filled 2022-08-12: qty 30, 30d supply, fill #0

## 2022-08-12 MED ORDER — CHLORTHALIDONE 25 MG PO TABS
25.0000 mg | ORAL_TABLET | Freq: Every day | ORAL | 5 refills | Status: DC
  Filled 2022-08-12: qty 30, 30d supply, fill #0

## 2022-08-12 MED ORDER — ATORVASTATIN CALCIUM 40 MG PO TABS
40.0000 mg | ORAL_TABLET | Freq: Every day | ORAL | 2 refills | Status: DC
  Filled 2022-08-12: qty 30, 30d supply, fill #0

## 2022-08-12 MED ORDER — INSULIN DETEMIR 100 UNIT/ML ~~LOC~~ SOLN
10.0000 [IU] | Freq: Every day | SUBCUTANEOUS | 11 refills | Status: DC
  Filled 2022-08-12: qty 10, 42d supply, fill #0
  Filled 2022-08-12: qty 10, 34d supply, fill #0

## 2022-08-12 MED ORDER — AMOXICILLIN-POT CLAVULANATE 875-125 MG PO TABS
1.0000 | ORAL_TABLET | Freq: Two times a day (BID) | ORAL | Status: DC
  Administered 2022-08-12: 1 via ORAL
  Filled 2022-08-12: qty 1

## 2022-08-12 NOTE — Progress Notes (Signed)
Pharmacy Antibiotic Note  Kimberly Herman is a 56 y.o. female admitted on 08/06/2022 with Left sided perinephric / renal fossa abscess.   Pharmacy has been consulted for Cefepime dosing.  ID: Left-sided perinephric/psoas muscle abscess versus infected hematoma - s/p left nephrectomy in Aug 2023 - s/p drain placement to left retroperitoneal abscess 9/19  - Afebrile. WBC WNL, Scr <1  9/16 flagyl x1 9/16 cefepime>>  9/16 ucx: <10k colonies 9/16 bcx x1: NGTD  9/19: Abscess>>  Plan: -Cefepime 2gm IV q8h. Dose ok for CrCl>60. Pharmacy will sign off. Please reconsult for further dosing assitance.    Height: '5\' 6"'$  (167.6 cm) Weight: 81.7 kg (180 lb 0.4 oz) IBW/kg (Calculated) : 59.3  Temp (24hrs), Avg:98.1 F (36.7 C), Min:97.9 F (36.6 C), Max:98.3 F (36.8 C)  Recent Labs  Lab 08/06/22 2126 08/07/22 0019 08/08/22 0359 08/09/22 0353 08/12/22 0451  WBC 10.3  --  7.7 6.4 4.3  CREATININE 1.07*  --  1.17* 0.84 0.96  LATICACIDVEN 0.8 1.0  --   --   --     Estimated Creatinine Clearance: 68.9 mL/min (by C-G formula based on SCr of 0.96 mg/dL).    Allergies  Allergen Reactions   Amlodipine Swelling   Bactrim [Sulfamethoxazole-Trimethoprim] Nausea Only    Extreme nausea and felt really bad   Carvedilol Diarrhea     Kimberly Herman S. Alford Highland, PharmD, BCPS Clinical Staff Pharmacist [email or web address]  Wayland Salinas 08/12/2022 8:53 AM

## 2022-08-12 NOTE — Plan of Care (Signed)
  Problem: Education: Goal: Knowledge of General Education information will improve Description Including pain rating scale, medication(s)/side effects and non-pharmacologic comfort measures Outcome: Progressing   Problem: Health Behavior/Discharge Planning: Goal: Ability to manage health-related needs will improve Outcome: Progressing   

## 2022-08-12 NOTE — Discharge Summary (Signed)
Physician Discharge Summary   Patient: Kimberly Herman MRN: [id number] DOB: April 24, 1966  Admit date:     08/06/2022  Discharge date: 08/12/22  Discharge Physician: Hosie Poisson   PCP: Bo Merino I, NP   Recommendations at discharge:  Please follow up with PCP in one week.  Please follow up with Urology in one week.  Please follow up with IR as recommended.   Discharge Diagnoses: Principal Problem:   Renal abscess, left Active Problems:   T2DM (type 2 diabetes mellitus) (HCC)   HTN (hypertension)   Hyperlipidemia    Hospital Course:  56 y.o. female with medical history significant of type 2 diabetes, hypertension, recent extensive history of infected stones of the kidneys and status post left nephrectomy 1 and half months ago presents to the emergency room with about 3 days of not feeling well, right flank pain, patient was found to have left perinephric/abscess and admitted to the hospital with IR consultation. Assessment and Plan:   Left sided perinephric / renal fossa abscess S/p drain placement by Dr. Anselm Pancoast on 9/19 - 15 mL bloody purulent fluid aspirated  Fluid sent for cultures and pending.  Urine and blood cultures are negative.  Was on IV cefepime, transition to oral antibiotics for 2 weeks to complete the course. \ Continue with pain control.  Afebrile this morning.      Type 2 DM  Resume  home meds on discharge.  Insulin dependent.      Hypertension:  Bp parameters are optimal.      Hyperlipidemia:  Resume statin on discharge.       Consultants: iR Procedures performed: IR Drain placement.   Disposition: Home Diet recommendation:  Discharge Diet Orders (From admission, onward)     Start     Ordered   08/12/22 0000  Diet - low sodium heart healthy        08/12/22 1206           Regular diet DISCHARGE MEDICATION: Allergies as of 08/12/2022       Reactions   Amlodipine Swelling   Bactrim [sulfamethoxazole-trimethoprim] Nausea Only    Extreme nausea and felt really bad   Carvedilol Diarrhea        Medication List     STOP taking these medications    omeprazole 20 MG capsule Commonly known as: PRILOSEC   tamsulosin 0.4 MG Caps capsule Commonly known as: FLOMAX       TAKE these medications    amoxicillin-clavulanate 875-125 MG tablet Commonly known as: AUGMENTIN Take 1 tablet by mouth every 12 (twelve) hours for 14 days.   atorvastatin 40 MG tablet Commonly known as: LIPITOR Take 1 tablet (40 mg total) by mouth daily.   blood glucose meter kit and supplies Kit Dispense based on patient and insurance preference. Use up to four times daily as directed. (FOR ICD-9 250.00, 250.01).   Blood Pressure Cuff Misc Check blood pressure once daily at the same time and record readings   candesartan 32 MG tablet Commonly known as: ATACAND Take 1 tablet (32 mg total) by mouth daily.   chlorthalidone 25 MG tablet Commonly known as: HYGROTON Take 1 tablet (25 mg total) by mouth daily.   glucose blood test strip Commonly known as: True Metrix Blood Glucose Test TEST UP TO FOUR TIMES DAILY   Levemir 100 UNIT/ML injection Generic drug: insulin detemir Inject 0.1 mLs (10 Units total) into the skin at bedtime. What changed: Another medication with the same name was removed. Continue  taking this medication, and follow the directions you see here.        Discharge Exam: Filed Weights   08/10/22 0600 08/11/22 0500 08/12/22 0500  Weight: 81.2 kg 81.4 kg 81.7 kg   General exam: Appears calm and comfortable  Respiratory system: Clear to auscultation. Respiratory effort normal. Cardiovascular system: S1 & S2 heard, RRR. No JVD, murmurs, rubs, gallops or clicks. No pedal edema. Gastrointestinal system: Abdomen is nondistended, soft and nontender. No organomegaly or masses felt. Normal bowel sounds heard. Central nervous system: Alert and oriented. No focal neurological deficits. Extremities: Symmetric 5 x 5  power. Skin: No rashes, lesions or ulcers Psychiatry: Judgement and insight appear normal. Mood & affect appropriate.     Condition at discharge: fair  The results of significant diagnostics from this hospitalization (including imaging, microbiology, ancillary and laboratory) are listed below for reference.   Imaging Studies: CT GUIDED PERITONEAL/RETROPERITONEAL FLUID DRAIN BY PERC CATH  Result Date: 08/10/2022 INDICATION: 56 year old with history of left nephrectomy and previous left retroperitoneal abscess. Patient now has a complex fluid collection involving the left psoas and posterior left abdominal wall. EXAM: CT-GUIDED PLACEMENT OF A DRAINAGE CATHETER IN THE LEFT RETROPERITONEAL ABSCESS MEDICATIONS: Moderate sedation ANESTHESIA/SEDATION: Moderate (conscious) sedation was employed during this procedure. A total of Versed 6.86m and fentanyl 200 mcg was administered intravenously at the order of the provider performing the procedure. Total intra-service moderate sedation time: 48 minutes. Patient's level of consciousness and vital signs were monitored continuously by radiology nurse throughout the procedure under the supervision of the provider performing the procedure. COMPLICATIONS: None immediate. PROCEDURE: Informed written consent was obtained from the patient after a thorough discussion of the procedural risks, benefits and alternatives. All questions were addressed. Maximal Sterile Barrier Technique was utilized including caps, mask, sterile gowns, sterile gloves, sterile drape, hand hygiene and skin antiseptic. A timeout was performed prior to the initiation of the procedure. Patient was placed prone. CT images through the abdomen were obtained. The abnormal area involving the posterior left abdominal wall and left psoas was identified and targeted. The overlying skin was prepped with chlorhexidine. Sterile field was created. Skin was anesthetized using 1% lidocaine. Small incision was made.  Using CT guidance, an 18 gauge trocar needle was directed into the left psoas muscle. Initially, no significant fluid could be aspirated. Second small incision was made and the needle was directed slightly more anteriorly. Small amount of bloody purulent fluid was eventually aspirated. Superstiff Amplatz wire was placed and follow up CT images confirmed placement within the complex collection. The tract was dilated to accommodate a 10 [place] drain. Approximately 15 mL of bloody purulent fluid was removed. Drain was sutured to skin and attached to a suction bulb. Dressing was placed. RADIATION DOSE REDUCTION: This exam was performed according to the departmental dose-optimization program which includes automated exposure control, adjustment of the mA and/or kV according to patient size and/or use of iterative reconstruction technique. FINDINGS: Complex collection involving the left psoas muscle. Drain was successfully placed within the collection and 15 mL of bloody purulent fluid was removed. IMPRESSION: CT-guided placement of a drainage catheter in the left retroperitoneal abscess. Electronically Signed   By: AMarkus DaftM.D.   On: 08/10/2022 17:19   CT ABDOMEN PELVIS W CONTRAST  Result Date: 08/09/2022 CLINICAL DATA:  Abdominal abscess (Ped 0-17y) perinephric abscess , persistent fever . study with iv contrast to look for abscess EXAM: CT ABDOMEN AND PELVIS WITH CONTRAST TECHNIQUE: Multidetector CT imaging of the  abdomen and pelvis was performed using the standard protocol following bolus administration of intravenous contrast. RADIATION DOSE REDUCTION: This exam was performed according to the departmental dose-optimization program which includes automated exposure control, adjustment of the mA and/or kV according to patient size and/or use of iterative reconstruction technique. CONTRAST:  150m OMNIPAQUE IOHEXOL 300 MG/ML  SOLN COMPARISON:  08/06/2022 FINDINGS: Lower chest: Minimal left base  atelectasis. No confluent opacity on the right. No effusions. Hepatobiliary: Diffuse low-density throughout the liver compatible with fatty infiltration. No focal abnormality. Gallbladder unremarkable. Pancreas: No focal abnormality or ductal dilatation. Spleen: No focal abnormality.  Normal size. Adrenals/Urinary Tract: Status post left nephrectomy. Ill-defined low-density area within the left psoas muscle with peripheral enhancement measuring 5.4 x 2.3 cm concerning for abscess. Surrounding retroperitoneal stranding. No stones or hydronephrosis or mass in the right kidney. Adrenal glands unremarkable. Urinary bladder decompressed, grossly unremarkable. Stomach/Bowel: Normal appendix. Stomach, large and small bowel grossly unremarkable. Vascular/Lymphatic: No evidence of aneurysm or adenopathy. Reproductive: Lobular uterus compatible with fibroids. No adnexal mass. Other: No free fluid or free air. Musculoskeletal: No acute bony abnormality. IMPRESSION: Complex somewhat ill-defined fluid collection with peripheral enhancement in the left psoas muscle/retroperitoneum concerning for abscess. Status post left nephrectomy. Hepatic steatosis. Fibroid uterus. Electronically Signed   By: KRolm BaptiseM.D.   On: 08/09/2022 13:06   CT Renal Stone Study  Result Date: 08/06/2022 CLINICAL DATA:  Right flank pain for 2 days with fever. History of left nephrectomy. EXAM: CT ABDOMEN AND PELVIS WITHOUT CONTRAST TECHNIQUE: Multidetector CT imaging of the abdomen and pelvis was performed following the standard protocol without IV contrast. RADIATION DOSE REDUCTION: This exam was performed according to the departmental dose-optimization program which includes automated exposure control, adjustment of the mA and/or kV according to patient size and/or use of iterative reconstruction technique. COMPARISON:  03/08/2022. FINDINGS: Lower chest: No acute abnormality. Hepatobiliary: No focal liver abnormality is seen. Hepatic steatosis  is noted. No gallstones, gallbladder wall thickening, or biliary dilatation. Pancreas: Unremarkable. No pancreatic ductal dilatation or surrounding inflammatory changes. Spleen: Normal in size without focal abnormality. Adrenals/Urinary Tract: The adrenal glands are within normal limits. The left kidney is surgically absent with fat stranding and soft tissue thickening in the left renal fossa extending into the iliopsoas muscle and pelvis on the left. There is a vague hypodense region in the iliopsoas and paraspinal soft tissues on the left measuring 4.0 x 1.7 x 6.9 cm. Examination is limited due to lack of IV contrast. There is asymmetric enlargement of the iliacus muscle on the left, not significantly changed from the prior exam. No intramuscular hematoma. No renal calculus or hydronephrosis on the right. Bladder is unremarkable. Stomach/Bowel: Stomach is within normal limits. Appendix appears normal. No evidence of bowel wall thickening, distention, or inflammatory changes. No free air or pneumatosis. A few scattered diverticula are present along the colon without evidence of diverticulitis. Vascular/Lymphatic: Aortic atherosclerosis. Prominent lymph nodes are noted in the periaortic space in the retroperitoneum on the left which may be reactive. Reproductive: The uterus is enlarged and contains multiple fibroids. No adnexal mass. Other: No abdominopelvic ascites. Musculoskeletal: Degenerative changes are present in the thoracolumbar spine. No acute osseous abnormality. IMPRESSION: 1. No renal calculus or hydronephrosis on the right. 2. Status post left nephrectomy with fat stranding in the left renal fossa extending into the retroperitoneum and pelvis on the left. There is a vague hypodense region in the iliopsoas muscle on the left measuring 4.0 x 1.7 x 6.9 cm,  increased in size from the previous exam and may represent abscess. Evaluation is limited due to lack of IV contrast. 3. Hepatic steatosis. 4. Enlarged  fibroid uterus. 5. Aortic atherosclerosis. Electronically Signed   By: Brett Fairy M.D.   On: 08/06/2022 23:23    Microbiology: Results for orders placed or performed during the hospital encounter of 08/06/22  Blood culture (routine x 2)     Status: None   Collection Time: 08/07/22 12:30 AM   Specimen: BLOOD  Result Value Ref Range Status   Specimen Description   Final    BLOOD BLOOD LEFT FOREARM Performed at [facility] [address], [place], [place] [address]    Special Requests   Final    BOTTLES DRAWN AEROBIC AND ANAEROBIC Blood Culture results may not be optimal due to an excessive volume of blood received in culture bottles Performed at [facility] [address] [place], [place] [address]    Culture   Final    NO GROWTH 5 DAYS Performed at [facility], [place] [address], [place], [place] [address]    Report Status 08/12/2022 FINAL  Final  Urine Culture     Status: Abnormal   Collection Time: 08/07/22  2:53 AM   Specimen: Urine, Clean Catch  Result Value Ref Range Status   Specimen Description   Final    URINE, CLEAN CATCH Performed at [facility], [place] [address] [place], [place] [address]    Special Requests   Final    NONE Performed at [facility], [place] [address], [place], [place] [address]    Culture (A)  Final    <10,000 COLONIES/mL INSIGNIFICANT GROWTH Performed at [facility] [address] [place], [place] [address]    Report Status 08/08/2022 FINAL  Final  Blood culture (routine x 2)     Status: None (Preliminary result)   Collection Time: 08/08/22  3:59 AM   Specimen: BLOOD  Result Value Ref Range Status   Specimen Description   Final    BLOOD RIGHT ANTECUBITAL Performed at [facility] [address] [place], [place] [address]    Special Requests   Final    BOTTLES DRAWN AEROBIC ONLY Blood Culture results may not be optimal due to an  inadequate volume of blood received in culture bottles Performed at [facility] [address], [place], [place] [address]    Culture   Final    NO GROWTH 4 DAYS Performed at [facility], [place] [address], [place], [place] [address]    Report Status PENDING  Incomplete  Aerobic/Anaerobic Culture w Gram Stain (surgical/deep wound)     Status: None (Preliminary result)   Collection Time: 08/10/22  4:00 PM   Specimen: Abscess  Result Value Ref Range Status   Specimen Description   Final    ABSCESS Performed at [facility] [address] [place], [place] [address]    Special Requests   Final    NONE Performed at [facility], [place] [address], [place], [place] [address]    Gram Stain   Final    ABUNDANT WBC PRESENT,BOTH PMN AND MONONUCLEAR NO ORGANISMS SEEN Performed at [facility], [place] [address], [place], [place] [address]    Culture   Final    RARE CITROBACTER KOSERI SUSCEPTIBILITIES TO FOLLOW NO ANAEROBES ISOLATED; CULTURE IN PROGRESS FOR 5 DAYS  Report Status PENDING  Incomplete    Labs: CBC: Recent Labs  Lab 08/06/22 2126 08/08/22 0359 08/09/22 0353 08/12/22 0451  WBC 10.3 7.7 6.4 4.3  NEUTROABS 7.7  --  4.2 2.2  HGB 10.3* 8.6* 9.3* 9.3*  HCT 30.5* 26.0* 28.9* 28.4*  MCV 85.9 88.1 88.9 87.9  PLT 329 236 274 341   Basic Metabolic Panel: Recent Labs  Lab 08/06/22 2126 08/08/22 0359 08/09/22 0353 08/12/22 0451  NA 138 144 142 144  K 3.8 4.1 3.7 3.8  CL 105 111 106 111  CO2 '25 27 28 28  ' GLUCOSE 160* 149* 132* 140*  BUN '15 10 8 17  ' CREATININE 1.07* 1.17* 0.84 0.96  CALCIUM 8.9 8.6* 8.8* 9.0   Liver Function Tests: Recent Labs  Lab 08/06/22 2126  AST 20  ALT 18  ALKPHOS 68  BILITOT 0.6  PROT 8.4*  ALBUMIN 3.5   CBG: Recent Labs  Lab 08/11/22 0754 08/11/22 1650 08/11/22 2049 08/12/22 0913 08/12/22 1226  GLUCAP 119* 126* 138* 151* 160*    Discharge time spent: 39  minutes.   Signed: Hosie Poisson, MD [facility] 08/12/2022

## 2022-08-12 NOTE — Progress Notes (Signed)
Referring Physician(s): [place]  Supervising Physician: Jacqulynn Cadet  Patient Status:  [facility] - In-pt  Chief Complaint:  Flank pain, left nephrectomy bed fluid collection concerning for abscess  Subjective: Pt doing ok at present; denies fever/chills/worsening abd /flank pain; did have some nausea earlier but resolved   Allergies: Amlodipine, Bactrim [sulfamethoxazole-trimethoprim], and Carvedilol  Medications: Prior to Admission medications   Medication Sig Start Date End Date Taking? Authorizing Provider  atorvastatin (LIPITOR) 40 MG tablet Take 1 tablet (40 mg total) by mouth daily. 05/19/22 08/17/22 Yes Fenton Foy, NP  candesartan (ATACAND) 32 MG tablet Take 1 tablet (32 mg total) by mouth daily. 03/17/22 03/17/23 Yes [place], Jake Church I, NP  chlorthalidone (HYGROTON) 25 MG tablet Take 1 tablet (25 mg total) by mouth daily. 05/19/22 11/15/22 Yes Fenton Foy, NP  insulin detemir (LEVEMIR) 100 UNIT/ML injection Inject 10 Units into the skin at bedtime.   Yes [provider]  blood glucose meter kit and supplies KIT Dispense based on patient and insurance preference. Use up to four times daily as directed. (FOR ICD-9 250.00, 250.01). 08/29/19   Azzie Glatter, FNP  Blood Pressure Monitoring (BLOOD PRESSURE CUFF) MISC Check blood pressure once daily at the same time and record readings 03/07/17   Scot Jun, FNP  glucose blood (TRUE METRIX BLOOD GLUCOSE TEST) test strip TEST UP TO FOUR TIMES DAILY 05/11/17   Scot Jun, FNP  insulin detemir (LEVEMIR) 100 UNIT/ML injection Inject 0.1 mLs (10 Units total) into the skin at bedtime. Patient taking differently: Inject 10 Units into the skin at bedtime as needed (for blood glucose greater than 240). 10/20/21 01/18/22  Bo Merino I, NP  omeprazole (PRILOSEC) 20 MG capsule Take 1 capsule (20 mg total) by mouth daily. Patient not taking: Reported on 05/19/2022 10/19/21   Bo Merino I, NP   tamsulosin (FLOMAX) 0.4 MG CAPS capsule Take 1 capsule (0.4 mg total) by mouth daily for 14 days. Patient not taking: Reported on 05/19/2022 04/08/22        Vital Signs: BP 137/70 (BP Location: Left Arm)   Pulse 60   Temp 98.3 F (36.8 C) (Oral)   Resp 18   Ht 5' 6" (1.676 m)   Wt 180 lb 0.4 oz (81.7 kg)   SpO2 100%   BMI 29.06 kg/m   Physical Exam awake/alert; left RP drain intact, site mildly tender with palpation, OP 20 cc blood-tinged fluid; drain flushed earlier by nurse  Imaging: CT GUIDED PERITONEAL/RETROPERITONEAL FLUID DRAIN BY PERC CATH  Result Date: 08/10/2022 INDICATION: 56 year old with history of left nephrectomy and previous left retroperitoneal abscess. Patient now has a complex fluid collection involving the left psoas and posterior left abdominal wall. EXAM: CT-GUIDED PLACEMENT OF A DRAINAGE CATHETER IN THE LEFT RETROPERITONEAL ABSCESS MEDICATIONS: Moderate sedation ANESTHESIA/SEDATION: Moderate (conscious) sedation was employed during this procedure. A total of Versed 6.81m and fentanyl 200 mcg was administered intravenously at the order of the provider performing the procedure. Total intra-service moderate sedation time: 48 minutes. Patient's level of consciousness and vital signs were monitored continuously by radiology nurse throughout the procedure under the supervision of the provider performing the procedure. COMPLICATIONS: None immediate. PROCEDURE: Informed written consent was obtained from the patient after a thorough discussion of the procedural risks, benefits and alternatives. All questions were addressed. Maximal Sterile Barrier Technique was utilized including caps, mask, sterile gowns, sterile gloves, sterile drape, hand hygiene and skin antiseptic. A timeout was performed prior to the initiation of  the procedure. Patient was placed prone. CT images through the abdomen were obtained. The abnormal area involving the posterior left abdominal wall and left psoas  was identified and targeted. The overlying skin was prepped with chlorhexidine. Sterile field was created. Skin was anesthetized using 1% lidocaine. Small incision was made. Using CT guidance, an 18 gauge trocar needle was directed into the left psoas muscle. Initially, no significant fluid could be aspirated. Second small incision was made and the needle was directed slightly more anteriorly. Small amount of bloody purulent fluid was eventually aspirated. Superstiff Amplatz wire was placed and follow up CT images confirmed placement within the complex collection. The tract was dilated to accommodate a 10 [place] multipurpose drain. Approximately 15 mL of bloody purulent fluid was removed. Drain was sutured to skin and attached to a suction bulb. Dressing was placed. RADIATION DOSE REDUCTION: This exam was performed according to the departmental dose-optimization program which includes automated exposure control, adjustment of the mA and/or kV according to patient size and/or use of iterative reconstruction technique. FINDINGS: Complex collection involving the left psoas muscle. Drain was successfully placed within the collection and 15 mL of bloody purulent fluid was removed. IMPRESSION: CT-guided placement of a drainage catheter in the left retroperitoneal abscess. Electronically Signed   By: Markus Daft M.D.   On: 08/10/2022 17:19   CT ABDOMEN PELVIS W CONTRAST  Result Date: 08/09/2022 CLINICAL DATA:  Abdominal abscess (Ped 0-17y) perinephric abscess , persistent fever . study with iv contrast to look for abscess EXAM: CT ABDOMEN AND PELVIS WITH CONTRAST TECHNIQUE: Multidetector CT imaging of the abdomen and pelvis was performed using the standard protocol following bolus administration of intravenous contrast. RADIATION DOSE REDUCTION: This exam was performed according to the departmental dose-optimization program which includes automated exposure control, adjustment of the mA and/or kV according to patient  size and/or use of iterative reconstruction technique. CONTRAST:  17m OMNIPAQUE IOHEXOL 300 MG/ML  SOLN COMPARISON:  08/06/2022 FINDINGS: Lower chest: Minimal left base atelectasis. No confluent opacity on the right. No effusions. Hepatobiliary: Diffuse low-density throughout the liver compatible with fatty infiltration. No focal abnormality. Gallbladder unremarkable. Pancreas: No focal abnormality or ductal dilatation. Spleen: No focal abnormality.  Normal size. Adrenals/Urinary Tract: Status post left nephrectomy. Ill-defined low-density area within the left psoas muscle with peripheral enhancement measuring 5.4 x 2.3 cm concerning for abscess. Surrounding retroperitoneal stranding. No stones or hydronephrosis or mass in the right kidney. Adrenal glands unremarkable. Urinary bladder decompressed, grossly unremarkable. Stomach/Bowel: Normal appendix. Stomach, large and small bowel grossly unremarkable. Vascular/Lymphatic: No evidence of aneurysm or adenopathy. Reproductive: Lobular uterus compatible with fibroids. No adnexal mass. Other: No free fluid or free air. Musculoskeletal: No acute bony abnormality. IMPRESSION: Complex somewhat ill-defined fluid collection with peripheral enhancement in the left psoas muscle/retroperitoneum concerning for abscess. Status post left nephrectomy. Hepatic steatosis. Fibroid uterus. Electronically Signed   By: KRolm BaptiseM.D.   On: 08/09/2022 13:06    Labs:  CBC: Recent Labs    08/06/22 2126 08/08/22 0359 08/09/22 0353 08/12/22 0451  WBC 10.3 7.7 6.4 4.3  HGB 10.3* 8.6* 9.3* 9.3*  HCT 30.5* 26.0* 28.9* 28.4*  PLT 329 236 274 301    COAGS: Recent Labs    03/05/22 0630 08/09/22 0855  INR 1.5* 1.2    BMP: Recent Labs    08/06/22 2126 08/08/22 0359 08/09/22 0353 08/12/22 0451  NA 138 144 142 144  K 3.8 4.1 3.7 3.8  CL 105 111 106 111  CO2  _0 GLUCOSE 160* 149* 132* 140*  BUN _1 CALCIUM 8.9 8.6* 8.8* 9.0  CREATININE 1.07*  1.17* 0.84 0.96  GFRNONAA >60 54* >60 >60    LIVER FUNCTION TESTS: Recent Labs    03/05/22 0312 03/06/22 0528 03/09/22 0434 08/06/22 2126  BILITOT 1.3* 0.8 0.5 0.6  AST 14* _2 ALT _3 ALKPHOS 57 57 73 68  PROT 6.7 6.2* 7.3 8.4*  ALBUMIN 2.9* 2.6* 3.1* 3.5    Assessment and Plan: Pt s/p drainage of recurrent left RP/nephrectomy bed fluid collection/? abscess  9/19; afebrile, WBC nl, hgb stable, creat nl, drain fl cx pend; cont with drain flushes, close OP monitoring, check final cx data; once OP minimal over a 2-3 day span or if WBC increases obtain f/u CT   Electronically Signed: D. Rowe Robert, PA-C 08/12/2022, 11:13 AM   I spent a total of 15 Minutes at the the patient's bedside AND on the patient's hospital floor or unit, greater than 50% of which was counseling/coordinating care for left retroperitoneal fluid collection drain    Patient ID: Kimberly Herman, female   DOB: [birth year], 56 y.o.   MRN: [id number]

## 2022-08-12 NOTE — TOC Transition Note (Signed)
Transition of Care [facility]) - CM/SW Discharge Note   Patient Details  Name: Kimberly Herman MRN: [id number] Date of Birth: 1966-03-05  [facility] CM/SW Contact:  Roseanne Kaufman, RN Phone Number: 08/12/2022, 12:38 PM   Clinical Narrative:   RNCM received message that patient's [place] letter was not working. This RNCM spoke with patient to gather additional information. Patient is getting prescriptions filled at [facility], spoke with [facility] and provided the [facility] ID, pharmacy reports able to process. Notified patient that [facility] pharmacy is working to fill prescription for pick up today.    No additional TOC needs at this time.     Final next level of care: Home/Self Care Barriers to Discharge: Continued Medical Work up   Patient Goals and CMS Choice Patient states their goals for this hospitalization and ongoing recovery are:: to go home   Choice offered to / list presented to : NA  Discharge Placement                       Discharge Plan and Services                                     Social Determinants of Health (SDOH) Interventions     Readmission Risk Interventions     No data to display

## 2022-08-13 LAB — CULTURE, BLOOD (ROUTINE X 2): Culture: NO GROWTH

## 2022-08-15 LAB — AEROBIC/ANAEROBIC CULTURE W GRAM STAIN (SURGICAL/DEEP WOUND)

## 2022-08-19 ENCOUNTER — Encounter: Payer: Self-pay | Admitting: Nurse Practitioner

## 2022-08-19 ENCOUNTER — Ambulatory Visit (INDEPENDENT_AMBULATORY_CARE_PROVIDER_SITE_OTHER): Payer: Self-pay | Admitting: Nurse Practitioner

## 2022-08-19 ENCOUNTER — Other Ambulatory Visit: Payer: Self-pay | Admitting: Internal Medicine

## 2022-08-19 VITALS — BP 161/84 | HR 87 | Resp 18

## 2022-08-19 DIAGNOSIS — I10 Essential (primary) hypertension: Secondary | ICD-10-CM

## 2022-08-19 DIAGNOSIS — E119 Type 2 diabetes mellitus without complications: Secondary | ICD-10-CM

## 2022-08-19 DIAGNOSIS — K6819 Other retroperitoneal abscess: Secondary | ICD-10-CM

## 2022-08-19 NOTE — Progress Notes (Signed)
_0  ID: Kimberly Herman, female    DOB: 01-Nov-1966, 56 y.o.   MRN: [id number]  Chief Complaint  Patient presents with   Follow-up    Referring provider: Teena Dunk, NP   HPI  Kimberly Herman 56 y.o. female  has a past medical history of Diabetes mellitus without complication ([place]), Hypertension, Menopause (2015), Vaginal polyp (10/2019), and Vitamin D deficiency (08/2019), status post left nephrectomy. To the [facility] for hospital follow up.    Patient presents today for follow-up on diabetes and hypertension.  She was seen 2 months ago and had labs at that time.  Patient's blood pressure was elevated in the office today.  Patient took herself off of her medications except for candesartan because she has been having extensive renal procedures recently due to kidney stones.  She has had  her (left) kidney removed. Still has drain in place. Will have repeat CT scan next week.  We discussed the importance of taking medications as directed.  Patient's blood pressure is elevated in the office and we discussed that she was previously on chlorthalidone which can help bring down blood pressure as well.  We will reorder her medications today as she can double check with pharmacy before starting them. Recent A1C was 8.7. Denies f/c/s, n/v/d, hemoptysis, PND, leg swelling Denies chest pain or edema.      Allergies  Allergen Reactions   Amlodipine Swelling   Bactrim [Sulfamethoxazole-Trimethoprim] Nausea Only    Extreme nausea and felt really bad   Carvedilol Diarrhea    Immunization History  Administered Date(s) Administered   PFIZER(Purple Top)SARS-COV-2 Vaccination 05/07/2020, 06/24/2020    Past Medical History:  Diagnosis Date   Diabetes mellitus without complication ([place])    Hypertension    Menopause 2015   Vaginal polyp 10/2019   Vitamin D deficiency 08/2019    Tobacco History: Social History   Tobacco Use  Smoking Status Never  Smokeless Tobacco Never   Counseling given:  Not Answered   Outpatient Encounter Medications as of 08/19/2022  Medication Sig   amoxicillin-clavulanate (AUGMENTIN) 875-125 MG tablet Take 1 tablet by mouth every 12 (twelve) hours for 14 days.   atorvastatin (LIPITOR) 40 MG tablet Take 1 tablet (40 mg total) by mouth daily.   blood glucose meter kit and supplies KIT Dispense based on patient and insurance preference. Use up to four times daily as directed. (FOR ICD-9 250.00, 250.01).   Blood Pressure Monitoring (BLOOD PRESSURE CUFF) MISC Check blood pressure once daily at the same time and record readings   candesartan (ATACAND) 32 MG tablet Take 1 tablet (32 mg total) by mouth daily.   chlorthalidone (HYGROTON) 25 MG tablet Take 1 tablet (25 mg total) by mouth daily.   glucose blood (TRUE METRIX BLOOD GLUCOSE TEST) test strip TEST UP TO FOUR TIMES DAILY   insulin detemir (LEVEMIR) 100 UNIT/ML injection Inject 0.1 mLs (10 Units total) into the skin at bedtime.   No facility-administered encounter medications on file as of 08/19/2022.     Review of Systems  Review of Systems  Constitutional: Negative.   HENT: Negative.    Cardiovascular: Negative.   Gastrointestinal: Negative.   Allergic/Immunologic: Negative.   Neurological: Negative.   Psychiatric/Behavioral: Negative.         Physical Exam  BP (!) 161/84   Pulse 87   Resp 18   Wt Readings from Last 5 Encounters:  08/12/22 180 lb 0.4 oz (81.7 kg)  05/19/22 188 lb (85.3 kg)  03/24/22 180  lb (81.6 kg)  03/17/22 176 lb 3.2 oz (79.9 kg)  03/04/22 186 lb 4.6 oz (84.5 kg)     Physical Exam Vitals and nursing note reviewed.  Constitutional:      General: She is not in acute distress.    Appearance: She is well-developed.  Cardiovascular:     Rate and Rhythm: Normal rate and regular rhythm.  Pulmonary:     Effort: Pulmonary effort is normal.     Breath sounds: Normal breath sounds.  Neurological:     Mental Status: She is alert and oriented to person, place, and  time.       Imaging: CT GUIDED PERITONEAL/RETROPERITONEAL FLUID DRAIN BY PERC CATH  Result Date: 08/10/2022 INDICATION: 56 year old with history of left nephrectomy and previous left retroperitoneal abscess. Patient now has a complex fluid collection involving the left psoas and posterior left abdominal wall. EXAM: CT-GUIDED PLACEMENT OF A DRAINAGE CATHETER IN THE LEFT RETROPERITONEAL ABSCESS MEDICATIONS: Moderate sedation ANESTHESIA/SEDATION: Moderate (conscious) sedation was employed during this procedure. A total of Versed 6.16m and fentanyl 200 mcg was administered intravenously at the order of the provider performing the procedure. Total intra-service moderate sedation time: 48 minutes. Patient's level of consciousness and vital signs were monitored continuously by radiology nurse throughout the procedure under the supervision of the provider performing the procedure. COMPLICATIONS: None immediate. PROCEDURE: Informed written consent was obtained from the patient after a thorough discussion of the procedural risks, benefits and alternatives. All questions were addressed. Maximal Sterile Barrier Technique was utilized including caps, mask, sterile gowns, sterile gloves, sterile drape, hand hygiene and skin antiseptic. A timeout was performed prior to the initiation of the procedure. Patient was placed prone. CT images through the abdomen were obtained. The abnormal area involving the posterior left abdominal wall and left psoas was identified and targeted. The overlying skin was prepped with chlorhexidine. Sterile field was created. Skin was anesthetized using 1% lidocaine. Small incision was made. Using CT guidance, an 18 gauge trocar needle was directed into the left psoas muscle. Initially, no significant fluid could be aspirated. Second small incision was made and the needle was directed slightly more anteriorly. Small amount of bloody purulent fluid was eventually aspirated. Superstiff Amplatz wire  was placed and follow up CT images confirmed placement within the complex collection. The tract was dilated to accommodate a 10 [place] drain. Approximately 15 mL of bloody purulent fluid was removed. Drain was sutured to skin and attached to a suction bulb. Dressing was placed. RADIATION DOSE REDUCTION: This exam was performed according to the departmental dose-optimization program which includes automated exposure control, adjustment of the mA and/or kV according to patient size and/or use of iterative reconstruction technique. FINDINGS: Complex collection involving the left psoas muscle. Drain was successfully placed within the collection and 15 mL of bloody purulent fluid was removed. IMPRESSION: CT-guided placement of a drainage catheter in the left retroperitoneal abscess. Electronically Signed   By: AMarkus DaftM.D.   On: 08/10/2022 17:19   CT ABDOMEN PELVIS W CONTRAST  Result Date: 08/09/2022 CLINICAL DATA:  Abdominal abscess (Ped 0-17y) perinephric abscess , persistent fever . study with iv contrast to look for abscess EXAM: CT ABDOMEN AND PELVIS WITH CONTRAST TECHNIQUE: Multidetector CT imaging of the abdomen and pelvis was performed using the standard protocol following bolus administration of intravenous contrast. RADIATION DOSE REDUCTION: This exam was performed according to the departmental dose-optimization program which includes automated exposure control, adjustment of the mA and/or kV according to patient  size and/or use of iterative reconstruction technique. CONTRAST:  163m OMNIPAQUE IOHEXOL 300 MG/ML  SOLN COMPARISON:  08/06/2022 FINDINGS: Lower chest: Minimal left base atelectasis. No confluent opacity on the right. No effusions. Hepatobiliary: Diffuse low-density throughout the liver compatible with fatty infiltration. No focal abnormality. Gallbladder unremarkable. Pancreas: No focal abnormality or ductal dilatation. Spleen: No focal abnormality.  Normal size. Adrenals/Urinary  Tract: Status post left nephrectomy. Ill-defined low-density area within the left psoas muscle with peripheral enhancement measuring 5.4 x 2.3 cm concerning for abscess. Surrounding retroperitoneal stranding. No stones or hydronephrosis or mass in the right kidney. Adrenal glands unremarkable. Urinary bladder decompressed, grossly unremarkable. Stomach/Bowel: Normal appendix. Stomach, large and small bowel grossly unremarkable. Vascular/Lymphatic: No evidence of aneurysm or adenopathy. Reproductive: Lobular uterus compatible with fibroids. No adnexal mass. Other: No free fluid or free air. Musculoskeletal: No acute bony abnormality. IMPRESSION: Complex somewhat ill-defined fluid collection with peripheral enhancement in the left psoas muscle/retroperitoneum concerning for abscess. Status post left nephrectomy. Hepatic steatosis. Fibroid uterus. Electronically Signed   By: KRolm BaptiseM.D.   On: 08/09/2022 13:06   CT Renal Stone Study  Result Date: 08/06/2022 CLINICAL DATA:  Right flank pain for 2 days with fever. History of left nephrectomy. EXAM: CT ABDOMEN AND PELVIS WITHOUT CONTRAST TECHNIQUE: Multidetector CT imaging of the abdomen and pelvis was performed following the standard protocol without IV contrast. RADIATION DOSE REDUCTION: This exam was performed according to the departmental dose-optimization program which includes automated exposure control, adjustment of the mA and/or kV according to patient size and/or use of iterative reconstruction technique. COMPARISON:  03/08/2022. FINDINGS: Lower chest: No acute abnormality. Hepatobiliary: No focal liver abnormality is seen. Hepatic steatosis is noted. No gallstones, gallbladder wall thickening, or biliary dilatation. Pancreas: Unremarkable. No pancreatic ductal dilatation or surrounding inflammatory changes. Spleen: Normal in size without focal abnormality. Adrenals/Urinary Tract: The adrenal glands are within normal limits. The left kidney is  surgically absent with fat stranding and soft tissue thickening in the left renal fossa extending into the iliopsoas muscle and pelvis on the left. There is a vague hypodense region in the iliopsoas and paraspinal soft tissues on the left measuring 4.0 x 1.7 x 6.9 cm. Examination is limited due to lack of IV contrast. There is asymmetric enlargement of the iliacus muscle on the left, not significantly changed from the prior exam. No intramuscular hematoma. No renal calculus or hydronephrosis on the right. Bladder is unremarkable. Stomach/Bowel: Stomach is within normal limits. Appendix appears normal. No evidence of bowel wall thickening, distention, or inflammatory changes. No free air or pneumatosis. A few scattered diverticula are present along the colon without evidence of diverticulitis. Vascular/Lymphatic: Aortic atherosclerosis. Prominent lymph nodes are noted in the periaortic space in the retroperitoneum on the left which may be reactive. Reproductive: The uterus is enlarged and contains multiple fibroids. No adnexal mass. Other: No abdominopelvic ascites. Musculoskeletal: Degenerative changes are present in the thoracolumbar spine. No acute osseous abnormality. IMPRESSION: 1. No renal calculus or hydronephrosis on the right. 2. Status post left nephrectomy with fat stranding in the left renal fossa extending into the retroperitoneum and pelvis on the left. There is a vague hypodense region in the iliopsoas muscle on the left measuring 4.0 x 1.7 x 6.9 cm, increased in size from the previous exam and may represent abscess. Evaluation is limited due to lack of IV contrast. 3. Hepatic steatosis. 4. Enlarged fibroid uterus. 5. Aortic atherosclerosis. Electronically Signed   By: LBrett FairyM.D.   On:  08/06/2022 23:23     Assessment & Plan:   HTN (hypertension) - AMB Referral to Pharmacy Medication Management  - continue current medications until pharmacy consult  2. Type 2 diabetes mellitus without  complication, without long-term current use of insulin (HCC)  - AMB Referral to Pharmacy Medication Management  Follow up:  Follow up in 3 months or sooner if needed     Fenton Foy, NP 08/20/2022

## 2022-08-20 ENCOUNTER — Telehealth: Payer: Self-pay | Admitting: Pharmacist

## 2022-08-20 ENCOUNTER — Other Ambulatory Visit: Payer: Self-pay | Admitting: Pharmacist

## 2022-08-20 NOTE — Patient Instructions (Signed)
1. Primary hypertension  - AMB Referral to Pharmacy Medication Management  - continue current medications until pharmacy consult  2. Type 2 diabetes mellitus without complication, without long-term current use of insulin (HCC)  - AMB Referral to Pharmacy Medication Management  Follow up:  Follow up in 3 months or sooner if needed

## 2022-08-20 NOTE — Assessment & Plan Note (Signed)
-   AMB Referral to Pharmacy Medication Management  - continue current medications until pharmacy consult  2. Type 2 diabetes mellitus without complication, without long-term current use of insulin (HCC)  - AMB Referral to Pharmacy Medication Management  Follow up:  Follow up in 3 months or sooner if needed

## 2022-08-20 NOTE — Progress Notes (Unsigned)
Contacted patient regarding referral for hypertension from [facility]   Left patient a voicemail to return my call at their [place], PharmD, [place] [phone]

## 2022-08-24 ENCOUNTER — Ambulatory Visit
Admission: RE | Admit: 2022-08-24 | Discharge: 2022-08-24 | Disposition: A | Discharge status: Home / Self Care | Payer: Self-pay | Source: Ambulatory Visit | Attending: Radiology | Admitting: Radiology

## 2022-08-24 ENCOUNTER — Ambulatory Visit
Admission: RE | Admit: 2022-08-24 | Discharge: 2022-08-24 | Disposition: A | Discharge status: Home / Self Care | Payer: Self-pay | Source: Ambulatory Visit | Attending: Internal Medicine | Admitting: Internal Medicine

## 2022-08-24 DIAGNOSIS — K6819 Other retroperitoneal abscess: Secondary | ICD-10-CM

## 2022-08-24 HISTORY — PX: IR RADIOLOGIST EVAL & MGMT: IMG5224

## 2022-08-24 MED ORDER — IOPAMIDOL (ISOVUE-300) INJECTION 61%
100.0000 mL | Freq: Once | INTRAVENOUS | Status: AC | PRN
  Administered 2022-08-24: 100 mL via INTRAVENOUS

## 2022-08-24 NOTE — Progress Notes (Signed)
Referring Physician(s): Newsome,G  Chief Complaint: The patient is seen in follow up today s/p CT-guided drainage of left retroperitoneal abscess on 08/10/2022  History of present illness: Kimberly Herman is a 56 year old female with past medical history significant for diabetes, hypertension, vitamin D deficiency, chronic diastolic heart failure and nephrolithiasis.  She was previously known to the IR service from CT-guided placement of a left perinephric abscess drain on 03/05/2022.  At the time of her perinephric abscess drain percutaneous nephrostomy access was also requested for eventual PCNL however there was minimal hydronephrosis and guarded access to the lower pole stones from the abscess therefore PCN was deferred. She subsequently underwent left nephrectomy on 06/28/22 at [facility].  She was admitted to [facility] on 9/15 with flank pain, poor appetite and fever.  CT scan revealed no renal calculus or hydronephrosis on the right.  There was some fat stranding in the left renal fossa extending into the retroperitoneum and pelvis on the left with vague hypodense region in the iliopsoas muscle on the left possibly representing abscess.  Subsequent follow-up imaging revealed complex ill-defined fluid collection with peripheral enhancement in the left psoas muscle/retroperitoneum concerning for abscess .  On 08/10/2022 she underwent drainage of this left retroperitoneal abscess.  Cultures grew rare Citrobacter koseri.  She was discharged from [facility] on 08/12/2022.  She presents today for follow-up CT and drain evaluation.  She currently denies fever, headache, chest pain, nausea, vomiting or bleeding.  She does have some continued left flank discomfort.  She is currently on Augmentin.  She is flushing her drain once a day with 5 cc with less than 5 cc output daily.   Past Medical History:  Diagnosis Date   Diabetes mellitus without complication ([place])    Hypertension     Menopause 2015   Vaginal polyp 10/2019   Vitamin D deficiency 08/2019    Past Surgical History:  Procedure Laterality Date   IR RADIOLOGIST EVAL & MGMT  08/24/2022    Allergies: Amlodipine, Bactrim [sulfamethoxazole-trimethoprim], and Carvedilol  Medications: Prior to Admission medications   Medication Sig Start Date End Date Taking? Authorizing Provider  amoxicillin-clavulanate (AUGMENTIN) 875-125 MG tablet Take 1 tablet by mouth every 12 (twelve) hours for 14 days. 08/12/22 08/26/22  Hosie Poisson, MD  atorvastatin (LIPITOR) 40 MG tablet Take 1 tablet (40 mg total) by mouth daily. 08/12/22 11/10/22  Hosie Poisson, MD  blood glucose meter kit and supplies KIT Dispense based on patient and insurance preference. Use up to four times daily as directed. (FOR ICD-9 250.00, 250.01). 08/29/19   Azzie Glatter, FNP  Blood Pressure Monitoring (BLOOD PRESSURE CUFF) MISC Check blood pressure once daily at the same time and record readings 03/07/17   Scot Jun, FNP  candesartan (ATACAND) 32 MG tablet Take 1 tablet (32 mg total) by mouth daily. 08/12/22 11/10/22  Hosie Poisson, MD  chlorthalidone (HYGROTON) 25 MG tablet Take 1 tablet (25 mg total) by mouth daily. 08/12/22 02/08/23  Hosie Poisson, MD  glucose blood (TRUE METRIX BLOOD GLUCOSE TEST) test strip TEST UP TO FOUR TIMES DAILY 05/11/17   Scot Jun, FNP  insulin detemir (LEVEMIR) 100 UNIT/ML injection Inject 0.1 mLs (10 Units total) into the skin at bedtime. 08/12/22   Hosie Poisson, MD     Family History  Problem Relation Age of Onset   Cancer Mother     Social History   Socioeconomic History   Marital status: Single    Spouse name: Not  on file   Number of children: Not on file   Years of education: Not on file   Highest education level: Not on file  Occupational History   Not on file  Tobacco Use   Smoking status: Never   Smokeless tobacco: Never  Vaping Use   Vaping Use: Never used  Substance and Sexual Activity    Alcohol use: Not Currently   Drug use: No   Sexual activity: Yes  Other Topics Concern   Not on file  Social History Narrative   Patient recently lost youngest son he was killed in a motor cycle accident at age 61. June 2018   Social Determinants of Health   Financial Resource Strain: Not on file  Food Insecurity: No Food Insecurity (08/07/2022)   Hunger Vital Sign    Worried About Running Out of Food in the Last Year: Never true    Ran Out of Food in the Last Year: Never true  Transportation Needs: No Transportation Needs (08/07/2022)   PRAPARE - Hydrologist (Medical): No    Lack of Transportation (Non-Medical): No  Physical Activity: Not on file  Stress: Not on file  Social Connections: Not on file     Vital Signs: Blood pressure 188/92, heart rate 79, O2 sat 98% room air, temp 98.8   Physical Exam awake, alert.  Left flank drain intact, insertion site okay, mildly tender to palpation, small amount of light yellow fluid in JP bulb.  Imaging: CT ABDOMEN PELVIS W CONTRAST  Result Date: 08/24/2022 CLINICAL DATA:  56 year old female with history left-sided perinephric abscess secondary to chronic nephrolithiasis, drainage performed 03/05/2022 with interval left nephrectomy at [facility]. A postoperative retroperitoneal abscess was discovered and [facility], with drainage 08/10/2022. EXAM: CT ABDOMEN AND PELVIS WITH CONTRAST TECHNIQUE: Multidetector CT imaging of the abdomen and pelvis was performed using the standard protocol following bolus administration of intravenous contrast. RADIATION DOSE REDUCTION: This exam was performed according to the departmental dose-optimization program which includes automated exposure control, adjustment of the mA and/or kV according to patient size and/or use of iterative reconstruction technique. CONTRAST:  17m ISOVUE-300 IOPAMIDOL (ISOVUE-300) INJECTION 61% COMPARISON:  Multiple prior most recent  08/09/2022 FINDINGS: Lower chest: No acute finding Hepatobiliary: Diffusely decreased liver attenuation compatible with steatosis. Unremarkable gallbladder Pancreas: Unremarkable Spleen: Borderline splenomegaly with the greatest diameter of the spleen measuring over 10 cm. Adrenals/Urinary Tract: - Right adrenal gland:  Unremarkable - Left adrenal gland: Unremarkable. - Right kidney: No hydronephrosis, nephrolithiasis, inflammation, or ureteral dilation. No focal lesion. - Left Kidney: Surgical changes of left nephrectomy. Multiple small lymph nodes in the Peri aortic nodal station at the left nephrectomy bed, likely reactive given the presence and history of infection. Pigtail drainage catheter is unchanged in position, located in the transition of the retro conal fat plane and the psoas muscle. No residual fluid or significant inflammatory changes. - Urinary Bladder: Unremarkable. Stomach/Bowel: - Stomach: Hiatal hernia.  Otherwise unremarkable - Small bowel: Unremarkable - Appendix: Normal. - Colon: Unremarkable. Vascular/Lymphatic: Minimal atherosclerotic changes of the abdominal aorta. No aneurysm. Normal course caliber and contour. Mesenteric and right renal artery patent. Iliac arteries and proximal femoral arteries patent. Reproductive: Fibroid uterus. Other: Nonspecific pelvic floor laxity. Musculoskeletal: No displaced fracture.  No bony canal narrowing. IMPRESSION: Unchanged position of the pigtail drainage catheter in the left-sided retroperitoneum with interval resolution of abscess. Borderline reactive lymph nodes in the retroperitoneum. Aortic Atherosclerosis (ICD10-I70.0). Additional ancillary findings as  above. Signed, Dulcy Fanny. Nadene Rubins, RPVI Vascular and Interventional Radiology Specialists [facility] Radiology Electronically Signed   By: Corrie Mckusick D.O.   On: 08/24/2022 13:55   IR Radiologist Eval & Mgmt  Result Date: 08/24/2022 CLINICAL DATA:  56 year old female with history  left-sided perinephric abscess secondary to chronic nephrolithiasis, drainage performed 03/05/2022 with interval left nephrectomy at [facility]. A postoperative retroperitoneal abscess was discovered and [facility], with drainage 08/10/2022. EXAM: CT ABDOMEN AND PELVIS WITH CONTRAST TECHNIQUE: Multidetector CT imaging of the abdomen and pelvis was performed using the standard protocol following bolus administration of intravenous contrast. RADIATION DOSE REDUCTION: This exam was performed according to the departmental dose-optimization program which includes automated exposure control, adjustment of the mA and/or kV according to patient size and/or use of iterative reconstruction technique. CONTRAST:  149m ISOVUE-300 IOPAMIDOL (ISOVUE-300) INJECTION 61% COMPARISON:  Multiple prior most recent 08/09/2022 FINDINGS: Lower chest: No acute finding Hepatobiliary: Diffusely decreased liver attenuation compatible with steatosis. Unremarkable gallbladder Pancreas: Unremarkable Spleen: Borderline splenomegaly with the greatest diameter of the spleen measuring over 10 cm. Adrenals/Urinary Tract: - Right adrenal gland:  Unremarkable - Left adrenal gland: Unremarkable. - Right kidney: No hydronephrosis, nephrolithiasis, inflammation, or ureteral dilation. No focal lesion. - Left Kidney: Surgical changes of left nephrectomy. Multiple small lymph nodes in the Peri aortic nodal station at the left nephrectomy bed, likely reactive given the presence and history of infection. Pigtail drainage catheter is unchanged in position, located in the transition of the retro conal fat plane and the psoas muscle. No residual fluid or significant inflammatory changes. - Urinary Bladder: Unremarkable. Stomach/Bowel: - Stomach: Hiatal hernia.  Otherwise unremarkable - Small bowel: Unremarkable - Appendix: Normal. - Colon: Unremarkable. Vascular/Lymphatic: Minimal atherosclerotic changes of the abdominal aorta. No aneurysm. Normal  course caliber and contour. Mesenteric and right renal artery patent. Iliac arteries and proximal femoral arteries patent. Reproductive: Fibroid uterus. Other: Nonspecific pelvic floor laxity. Musculoskeletal: No displaced fracture.  No bony canal narrowing. IMPRESSION: Unchanged position of the pigtail drainage catheter in the left-sided retroperitoneum with interval resolution of abscess. Borderline reactive lymph nodes in the retroperitoneum. Aortic Atherosclerosis (ICD10-I70.0). Additional ancillary findings as above. Signed, JDulcy Fanny WNadene Rubins RPVI Vascular and Interventional Radiology Specialists [facility] Electronically Signed   By: JCorrie MckusickD.O.   On: 08/24/2022 13:55    Labs:  CBC: Recent Labs    08/06/22 2126 08/08/22 0359 08/09/22 0353 08/12/22 0451  WBC 10.3 7.7 6.4 4.3  HGB 10.3* 8.6* 9.3* 9.3*  HCT 30.5* 26.0* 28.9* 28.4*  PLT 329 236 274 301    COAGS: Recent Labs    03/05/22 0630 08/09/22 0855  INR 1.5* 1.2    BMP: Recent Labs    08/06/22 2126 08/08/22 0359 08/09/22 0353 08/12/22 0451  NA 138 144 142 144  K 3.8 4.1 3.7 3.8  CL 105 111 106 111  CO2 '25 27 28 28  ' GLUCOSE 160* 149* 132* 140*  BUN '15 10 8 17  ' CALCIUM 8.9 8.6* 8.8* 9.0  CREATININE 1.07* 1.17* 0.84 0.96  GFRNONAA >60 54* >60 >60    LIVER FUNCTION TESTS: Recent Labs    03/05/22 0312 03/06/22 0528 03/09/22 0434 08/06/22 2126  BILITOT 1.3* 0.8 0.5 0.6  AST 14* '20 20 20  ' ALT '16 18 14 18  ' ALKPHOS 57 57 73 68  PROT 6.7 6.2* 7.3 8.4*  ALBUMIN 2.9* 2.6* 3.1* 3.5    Assessment: 56year old female with past medical history significant  for diabetes, hypertension, vitamin D deficiency, chronic diastolic heart failure and nephrolithiasis.  She was previously known to the IR service from CT-guided placement of a left perinephric abscess drain on 03/05/2022.  At the time of her perinephric abscess drain percutaneous nephrostomy access was also requested for eventual PCNL  however there was minimal hydronephrosis and guarded access to the lower pole stones from the abscess therefore PCN was deferred. She subsequently underwent left nephrectomy on 06/28/22 at [facility].  She was admitted to [facility] on 9/15 with flank pain, poor appetite and fever.  CT scan revealed no renal calculus or hydronephrosis on the right.  There was some fat stranding in the left renal fossa extending into the retroperitoneum and pelvis on the left with vague hypodense region in the iliopsoas muscle on the left possibly representing abscess.  Subsequent follow-up imaging revealed complex ill-defined fluid collection with peripheral enhancement in the left psoas muscle/retroperitoneum concerning for abscess .  On 08/10/2022 she underwent drainage of this left retroperitoneal abscess.  Cultures grew rare Citrobacter koseri.  She was discharged from [facility] on 08/12/2022.  She presents today for follow-up CT and drain evaluation.  She is currently on Augmentin.  She is afebrile. She has had minimal output from her drain.  Follow-up CT today shows no significant fluid in region of existing drain.  Images were reviewed with Dr. Earleen Newport and decision made to remove left flank drain. Pt's left flank drain was removed in its entirety without immediate complications.  Gauze dressing applied over site.  Patient states that she will be making follow-up plans to see Dr. Newsome(urology).   Signed: D. Rowe Robert, PA-C 08/24/2022, 2:06 PM   Please refer to Dr. Pasty Arch attestation of this note for management and plan.      Patient ID: Kimberly Herman, female   DOB: August 01, 1966, 56 y.o.   MRN: [id number]

## 2022-08-24 NOTE — Progress Notes (Signed)
Patient scheduled.

## 2022-08-26 ENCOUNTER — Other Ambulatory Visit: Payer: Self-pay | Admitting: Pharmacist

## 2022-08-26 NOTE — Patient Instructions (Signed)
Eithel,   It was great talking to you today!  We'll pursue patient assistance for Rybelsus. Here is the website for [employer] patient assistance application. You can use my email for the provider email and I will work with Lazaro Arms to complete that portion of the application (though please use MyChart communication or calling my phone listed below for communication!)   [email or web address]  Continue chlorthalidone and candesartan for your blood pressure and atorvastatin for cholesterol.   Check your blood pressure once daily, and any time you have concerning symptoms like headache, chest pain, dizziness, shortness of breath, or vision changes.   Our goal is less than 130/80.  To appropriately check your blood pressure, make sure you do the following:  1) Avoid caffeine, exercise, or tobacco products for 30 minutes before checking. Empty your bladder. 2) Sit with your back supported in a flat-backed chair. Rest your arm on something flat (arm of the chair, table, etc). 3) Sit still with your feet flat on the floor, resting, for at least 5 minutes.  4) Check your blood pressure. Take 1-2 readings.  5) Write down these readings and bring with you to any provider appointments.  Bring your home blood pressure machine with you to a provider's office for accuracy comparison at least once a year.   Make sure you take your blood pressure medications before you come to any office visit, even if you were asked to fast for labs.  Take care!  Catie Hedwig Morton, PharmD, [facility] [phone]

## 2022-08-26 NOTE — Progress Notes (Signed)
Chief Complaint  Patient presents with   Hypertension   Medication Management   Diabetes    Kimberly Herman is a 56 y.o. year old female who presented for a telephone visit.   They were referred to the pharmacist by their PCP for assistance in managing diabetes, hypertension, and hyperlipidemia.    Subjective:  Care Team: Primary Care Provider: Lazaro Arms; Next Scheduled Visit: 11/18/22  Medication Access/Adherence  Current Pharmacy:  [facility] [place], [address] AT [facility] [address] [place] [address] Phone: [phone] Fax: [place] [employer], [place] [address] Phone: [phone] Fax: [phone]   Patient reports affordability concerns with their medications: No  Patient reports access/transportation concerns to their pharmacy: No  Patient reports adherence concerns with their medications:  No     Diabetes:  Current medications: Levemir 10 units daily  Concerned about what she's read about metformin and impact on kidneys  Patient denies hypoglycemic s/sx including dizziness, shakiness, sweating.   Hypertension:  Current medications: chlorthalidone 25 mg daily, candesartan 32 mg daily Prior medications: amlodipine - swelling; spironolactone - bump in Scr  Reports she recently started back both chlorthalidone and candesartan.   Patient has an automated, upper arm home BP cuff Current blood pressure readings readings: reports home BP reading on Tuesday was 141/81. Reports reading at the office was much higher.   Patient denies hypotensive s/sx including dizziness, lightheadedness.   Hyperlipidemia/ASCVD Risk Reduction  Current lipid lowering medications: atorvastatin 40 mg daily    Health Maintenance  Health Maintenance Due  Topic Date Due   OPHTHALMOLOGY EXAM  Never done   Diabetic kidney  evaluation - Urine ACR  Never done   Zoster Vaccines- Shingrix (1 of 2) Never done   COVID-19 Vaccine (3 - Pfizer series) 08/19/2020   FOOT EXAM  07/02/2021   INFLUENZA VACCINE  Never done   MAMMOGRAM  08/01/2022     Objective: Lab Results  Component Value Date   HGBA1C 6.1 (A) 03/17/2022   HGBA1C 6.1 03/17/2022   HGBA1C 6.1 03/17/2022   HGBA1C 6.1 03/17/2022    Lab Results  Component Value Date   CREATININE 0.96 08/12/2022   BUN 17 08/12/2022   NA 144 08/12/2022   K 3.8 08/12/2022   CL 111 08/12/2022   CO2 28 08/12/2022    Lab Results  Component Value Date   CHOL 188 10/01/2021   HDL 40 10/01/2021   LDLCALC 110 (H) 10/01/2021   TRIG 217 (H) 10/01/2021   CHOLHDL 4.7 (H) 10/01/2021    Medications Reviewed Today     Reviewed by Osker Mason, RPH-CPP (Pharmacist) on 08/26/22 at [place] List Status: <None>   Medication Order Taking? Sig Documenting Provider Last Dose Status Informant  amoxicillin-clavulanate (AUGMENTIN) 875-125 MG tablet [id number] Yes Take 1 tablet by mouth every 12 (twelve) hours for 14 days. Hosie Poisson, MD Taking Active   atorvastatin (LIPITOR) 40 MG tablet [id number] Yes Take 1 tablet (40 mg total) by mouth daily. Hosie Poisson, MD Taking Active   blood glucose meter kit and supplies KIT [id number] Yes Dispense based on patient and insurance preference. Use up to four times daily as directed. (FOR ICD-9 250.00, 250.01). Azzie Glatter, FNP Taking Active Self  Blood Pressure Monitoring (BLOOD PRESSURE CUFF) MISC [id number] Yes Check blood pressure once daily at the same time  and record readings Scot Jun, FNP Taking Active Self  candesartan (ATACAND) 32 MG tablet [id number] Yes Take 1 tablet (32 mg total) by mouth daily. Hosie Poisson, MD Taking Active   chlorthalidone (HYGROTON) 25 MG tablet [id number] Yes Take 1 tablet (25 mg total) by mouth daily. Hosie Poisson, MD Taking Active   glucose blood (TRUE METRIX BLOOD GLUCOSE TEST) test  strip [id number] Yes TEST UP TO FOUR TIMES DAILY Scot Jun, FNP Taking Active Self  insulin detemir (LEVEMIR) 100 UNIT/ML injection [id number] Yes Inject 0.1 mLs (10 Units total) into the skin at bedtime. Hosie Poisson, MD Taking Active             SDOH Interventions Today    Flowsheet Row Most Recent Value  SDOH Interventions   Financial Strain Interventions Other (Comment)  [patient assistance]       Assessment/Plan:   Diabetes: - Currently uncontrolled - Reviewed goal A1c, goal fasting, and goal 2 hour post prandial glucose - Discussed SGLT2 vs GLP1. Patient concerned about risk of UTI with glucosuria, but could be something to consider in the future once glucose better controlled for renal protection. Discussed use of GLP1 with Rybelsus as patient is concerned about losing too much more weight than she has recently lost. Discussed mechanism of action, side effects. Patient amenable. Will discuss with PCP and pursue patient assistance from manufacturer.   Hypertension: - Currently uncontrolled though anticipated to improve with medication adherence - Reviewed long term cardiovascular and renal outcomes of uncontrolled blood pressure - Reviewed appropriate blood pressure monitoring technique and reviewed goal blood pressure. Recommended to check home blood pressure and heart rate daily - Recommend to continue current regimen at this time   Hyperlipidemia/ASCVD Risk Reduction: - Currently uncontrolled but unclear adherence. Recommend to continue current regimen and update lipid panel with next lab work    Follow Up Plan: phone call in 3 weeks  Catie TJodi Mourning, PharmD, [place] [phone]

## 2022-09-15 ENCOUNTER — Other Ambulatory Visit: Payer: Self-pay | Admitting: Pharmacist

## 2022-09-15 DIAGNOSIS — E119 Type 2 diabetes mellitus without complications: Secondary | ICD-10-CM

## 2022-09-15 DIAGNOSIS — I10 Essential (primary) hypertension: Secondary | ICD-10-CM

## 2022-09-15 NOTE — Progress Notes (Unsigned)
09/15/2022 Name: Kimberly Herman MRN: [id number] DOB: 09/29/1966  Chief Complaint  Patient presents with   Medication Management   Hypertension   Diabetes    Kimberly Herman is a 56 y.o. year old female who presented for a telephone visit.   They were referred to the pharmacist by their PCP for assistance in managing diabetes and hypertension.   Subjective:  Care Team: Primary Care Provider: Lazaro Arms Next Scheduled Visit: 11/18/22  Medication Access/Adherence  Current Pharmacy:  [facility] [place], [place] [address] AT [facility] OF [address] [place] [address] Phone: [phone] Fax: [employer], [place] [address] Phone: [phone] Fax: [phone]   Patient reports affordability concerns with their medications: No  Patient reports access/transportation concerns to their pharmacy: No  Patient reports adherence concerns with their medications:  No     Diabetes:  Current medications: Levemir 10 units daily  Patient reports she was unable to fully complete Rybelsus patient assistance application online.   Hypertension:  Current medications: candesartan 32 mg daily, chlorthalidone 25 mg daily Medications previously tried: amlodipine - swelling; spironolactone - Scr bump; carvedilol - severe diarrhea  Patient has a validated, automated, upper arm home BP cuff Current blood pressure readings readings: 150-160/86  Reports she feels her concern about hypertension worsens her anxiety and vice versa  Health Maintenance  Health Maintenance Due  Topic Date Due   OPHTHALMOLOGY EXAM  Never done   Diabetic kidney evaluation - Urine ACR  Never done   Zoster Vaccines- Shingrix (1 of 2) Never done   COVID-19 Vaccine (3 - Pfizer series) 08/19/2020   FOOT EXAM  07/02/2021   INFLUENZA VACCINE  Never done   MAMMOGRAM   08/01/2022   PAP SMEAR-Modifier  10/28/2022     Objective: Lab Results  Component Value Date   HGBA1C 6.1 (A) 03/17/2022   HGBA1C 6.1 03/17/2022   HGBA1C 6.1 03/17/2022   HGBA1C 6.1 03/17/2022    Lab Results  Component Value Date   CREATININE 0.96 08/12/2022   BUN 17 08/12/2022   NA 144 08/12/2022   K 3.8 08/12/2022   CL 111 08/12/2022   CO2 28 08/12/2022    Lab Results  Component Value Date   CHOL 188 10/01/2021   HDL 40 10/01/2021   LDLCALC 110 (H) 10/01/2021   TRIG 217 (H) 10/01/2021   CHOLHDL 4.7 (H) 10/01/2021    Medications Reviewed Today     Reviewed by Osker Mason, RPH-CPP (Pharmacist) on 08/26/22 at [facility] List Status: <None>   Medication Order Taking? Sig Documenting Provider Last Dose Status Informant  amoxicillin-clavulanate (AUGMENTIN) 875-125 MG tablet [id number] Yes Take 1 tablet by mouth every 12 (twelve) hours for 14 days. Hosie Poisson, MD Taking Active   atorvastatin (LIPITOR) 40 MG tablet [id number] Yes Take 1 tablet (40 mg total) by mouth daily. Hosie Poisson, MD Taking Active   blood glucose meter kit and supplies KIT [id number] Yes Dispense based on patient and insurance preference. Use up to four times daily as directed. (FOR ICD-9 250.00, 250.01). Azzie Glatter, FNP Taking Active Self  Blood Pressure Monitoring (BLOOD PRESSURE CUFF) MISC [id number] Yes Check blood pressure once daily at the same time and record readings Scot Jun, FNP Taking Active Self  candesartan (ATACAND) 32 MG tablet [id number] Yes Take 1 tablet (32 mg total) by  mouth daily. Hosie Poisson, MD Taking Active   chlorthalidone (HYGROTON) 25 MG tablet [id number] Yes Take 1 tablet (25 mg total) by mouth daily. Hosie Poisson, MD Taking Active   glucose blood (TRUE METRIX BLOOD GLUCOSE TEST) test strip [id number] Yes TEST UP TO FOUR TIMES DAILY Scot Jun, FNP Taking Active Self  insulin detemir (LEVEMIR) 100 UNIT/ML injection [id number] Yes Inject 0.1 mLs (10  Units total) into the skin at bedtime. Hosie Poisson, MD Taking Active               Assessment/Plan:   Diabetes: - Currently uncontrolled - Will complete Rybelsus patient assistance application with help from CPhT at Pharmacy at [facility]. Patient will complete and bring patient portion.    Hypertension: - Currently uncontrolled - Recommend to start hydralazine 25 mg twice daily. Starting conservatively due to patient history of side effects to medications and concern about remembing TID administration. Counseled to stop medication and call provider if severe skin rash. Discussed with PCP. Refer to [facility] due to uncontrolled hypertension with several medication intolerances.     Follow Up Plan: phone call in 4 weeks  Catie TJodi Mourning, PharmD, [facility] [phone]

## 2022-09-16 ENCOUNTER — Other Ambulatory Visit: Payer: Self-pay | Admitting: Nurse Practitioner

## 2022-09-16 ENCOUNTER — Other Ambulatory Visit: Payer: Self-pay

## 2022-09-16 ENCOUNTER — Other Ambulatory Visit (HOSPITAL_COMMUNITY): Payer: Self-pay

## 2022-09-16 ENCOUNTER — Encounter: Payer: Self-pay | Admitting: Pharmacist

## 2022-09-16 DIAGNOSIS — I10 Essential (primary) hypertension: Secondary | ICD-10-CM

## 2022-09-16 MED ORDER — HYDRALAZINE HCL 25 MG PO TABS
25.0000 mg | ORAL_TABLET | Freq: Two times a day (BID) | ORAL | 2 refills | Status: DC
  Filled 2022-09-16: qty 60, 30d supply, fill #0

## 2022-09-16 MED ORDER — RYBELSUS 3 MG PO TABS
3.0000 mg | ORAL_TABLET | Freq: Every day | ORAL | 0 refills | Status: DC
  Filled 2022-09-16: qty 30, fill #0

## 2022-09-16 MED ORDER — RYBELSUS 14 MG PO TABS
14.0000 mg | ORAL_TABLET | Freq: Every day | ORAL | 3 refills | Status: DC
  Filled 2022-09-16: qty 30, 30d supply, fill #0

## 2022-09-16 MED ORDER — RYBELSUS 7 MG PO TABS
7.0000 mg | ORAL_TABLET | Freq: Every day | ORAL | 0 refills | Status: DC
  Filled 2022-09-16: qty 30, fill #0

## 2022-09-16 NOTE — Addendum Note (Signed)
Addended by: Kaylyn Layer T on: 09/16/2022 09:58 AM   Modules accepted: Orders

## 2022-09-16 NOTE — Patient Instructions (Signed)
Hanh,   Start hydralazine 25 mg twice daily. This is a low dose and we will likely need to increase over time. Your Primary Care Provider placed a referral to the [facility].   Let's complete the physical application for the Rybelsus assistance from [employer]. I've sent these instructions in a separate message. We'll start with 3 mg once daily for a month, then increase to 7 mg thereafter.   Thanks!  Catie Hedwig Morton, PharmD, [facility] [phone]

## 2022-09-17 ENCOUNTER — Other Ambulatory Visit: Payer: Self-pay

## 2022-10-13 ENCOUNTER — Other Ambulatory Visit: Payer: Self-pay

## 2022-10-13 ENCOUNTER — Other Ambulatory Visit: Payer: Self-pay | Admitting: Pharmacist

## 2022-10-13 DIAGNOSIS — E119 Type 2 diabetes mellitus without complications: Secondary | ICD-10-CM

## 2022-10-13 MED ORDER — INSULIN DETEMIR 100 UNIT/ML ~~LOC~~ SOLN
14.0000 [IU] | Freq: Every day | SUBCUTANEOUS | 11 refills | Status: DC
  Filled 2022-10-13: qty 10, 71d supply, fill #0

## 2022-10-13 NOTE — Patient Instructions (Addendum)
Ms. Delange,  I spoke with [place]- they did receive your portion of they Rybelsus patient assistance application. They are waiting for the provider's office to fill out their portion of the paperwork. I have messaged your primary care provider about getting this filled out.   In the meantime while we wait on the Rybelsus, please increase your Levemir to 14 units daily.   Please let me know if you need anything!  Best, Joseph Art, Pharm.D. PGY-2 Ambulatory Care Pharmacy Resident 10/13/2022 2:44 PM

## 2022-10-13 NOTE — Chronic Care Management (AMB) (Signed)
10/13/2022 Name: Kimberly Herman MRN: [id number] DOB: 01-18-1966  Chief Complaint  Patient presents with   Medication Management    Diabetes, Hypertension    Kimberly Herman is a 56 y.o. year old female who presented for a telephone visit.   They were referred to the pharmacist by their PCP for assistance in managing diabetes and hypertension.   Subjective:  Care Team: Primary Care Provider: Bo Merino I, NP ; Next Scheduled Visit: 11/18/2022  Medication Access/Adherence  Current Pharmacy:  [id number] [place], [place] [address] AT [facility] OF [address] [place] [address] Phone: [phone] Fax: [place] [address], [place] [address] Phone: [phone] Fax: [phone]   Patient reports affordability concerns with their medications: No  Patient reports access/transportation concerns to their pharmacy: No - states she should have [place] insurance in 1-2 months.  Patient reports adherence concerns with their medications:  No     Diabetes:  Current medications: Levemir 10 units at bedtime  Current glucose readings: FBG=175. States readings are 'higher than she'd like'.  Patient denies hypoglycemic s/sx including dizziness, shakiness, sweating.  Current medication access support: Recently turned in paperwork for Rybelsus PAP to [facility] pharmacy. Awaiting MD portion before application can be submitted.   Hypertension:  Current medications: chlorthalidone 25 mg daily, hydralazine 25 mg BID Medications previously tried: candesartan- self DC'd; amlodipine - swelling; spironolactone - Scr bump; carvedilol - severe diarrhea   Patient has a validated, automated, upper arm home BP cuff Current blood pressure readings readings: 130/70s.   Patient denies hypotensive s/sx including dizziness, lightheadedness.  Patient denies hypertensive  symptoms including headache, chest pain, shortness of breath  Health Maintenance  Health Maintenance Due  Topic Date Due   OPHTHALMOLOGY EXAM  Never done   Diabetic kidney evaluation - Urine ACR  Never done   Hepatitis C Screening  Never done   COLONOSCOPY (Pts 45-61yr Insurance coverage will need to be confirmed)  Never done   Zoster Vaccines- Shingrix (1 of 2) Never done   FOOT EXAM  07/02/2021   INFLUENZA VACCINE  Never done   COVID-19 Vaccine (3 - 2023-24 season) 07/23/2022   MAMMOGRAM  08/01/2022   HEMOGLOBIN A1C  09/16/2022   PAP SMEAR-Modifier  10/28/2022     Objective: Lab Results  Component Value Date   HGBA1C 6.1 (A) 03/17/2022   HGBA1C 6.1 03/17/2022   HGBA1C 6.1 03/17/2022   HGBA1C 6.1 03/17/2022    Lab Results  Component Value Date   CREATININE 0.96 08/12/2022   BUN 17 08/12/2022   NA 144 08/12/2022   K 3.8 08/12/2022   CL 111 08/12/2022   CO2 28 08/12/2022    Lab Results  Component Value Date   CHOL 188 10/01/2021   HDL 40 10/01/2021   LDLCALC 110 (H) 10/01/2021   TRIG 217 (H) 10/01/2021   CHOLHDL 4.7 (H) 10/01/2021    Medications Reviewed Today     Reviewed by SPauletta Browns RSt. Hedwig(Pharmacist) on 10/13/22 at 162 Med List Status: <None>   Medication Order Taking? Sig Documenting Provider Last Dose Status Informant  atorvastatin (LIPITOR) 40 MG tablet [id number] Take 1 tablet (40 mg total) by mouth daily. AHosie Poisson MD Taking Active   blood glucose meter kit and supplies KIT [id number] Dispense based on patient and insurance preference. Use up to four times daily  as directed. (FOR ICD-9 250.00, 250.01). Azzie Glatter, FNP Taking Active Self  Blood Pressure Monitoring (BLOOD PRESSURE CUFF) MISC [id number] Yes Check blood pressure once daily at the same time and record readings Scot Jun, FNP Taking Active Self  candesartan (ATACAND) 32 MG tablet [id number] No Take 1 tablet (32 mg total) by mouth daily.  Patient not taking:  Reported on 10/13/2022   Hosie Poisson, MD Not Taking Active   chlorthalidone (HYGROTON) 25 MG tablet [id number] Yes Take 1 tablet (25 mg total) by mouth daily. Hosie Poisson, MD Taking Active   glucose blood (TRUE METRIX BLOOD GLUCOSE TEST) test strip [id number] Yes TEST UP TO FOUR TIMES DAILY Scot Jun, FNP Taking Active Self  hydrALAZINE (APRESOLINE) 25 MG tablet [id number] Yes Take 1 tablet (25 mg total) by mouth 2 (two) times daily. Fenton Foy, NP Taking Active   insulin detemir (LEVEMIR) 100 UNIT/ML injection [id number] Yes Inject 0.1 mLs (10 Units total) into the skin at bedtime. Hosie Poisson, MD Taking Active   Semaglutide [facility]) 14 MG TABS [id number] No Take 1 tablet (14 mg total) by mouth daily.  Patient not taking: Reported on 10/13/2022   Fenton Foy, NP Not Taking Active   Semaglutide (RYBELSUS) 3 MG TABS [id number] No Take 3 mg by mouth daily.  Patient not taking: Reported on 10/13/2022   Fenton Foy, NP Not Taking Active   Semaglutide [facility]) 7 MG TABS [id number] No Take 7 mg by mouth daily.  Patient not taking: Reported on 10/13/2022   Fenton Foy, NP Not Taking Active              Assessment/Plan:   Diabetes: - Currently uncontrolled - Recommend to increase Levemir to 14 units daily while awaiting Rybelsus PAP to be approved.  Discussed with PCP.  - Rybelsus PAP started, patient portion completed. Awaiting MD portion. Application has been refaxed to provider office.  - Recommend to check glucose daily  Hypertension: - Currently with improved control per patient reporting (home BP 130s/70s) - Reviewed appropriate blood pressure monitoring technique and reviewed goal blood pressure. Recommended to check home blood pressure and heart rate daily - Recommend to continue current regimen of chlorthalidone 25 mg daily and hydralazine 25 mg BID. Patient tolerated hydralazine without issues.  -Follow up with Dr. Oval Linsey for resistant  hypertension evaluation on 10/27/2022.   Follow Up Plan:  Phone call in January 2024 PCP visit on 11/18/2022 Cardiology visit on 10/27/2022.  Joseph Art, Pharm.D. PGY-2 Ambulatory Care Pharmacy Resident 10/13/2022 2:49 PM

## 2022-10-18 ENCOUNTER — Other Ambulatory Visit: Payer: Self-pay

## 2022-10-18 NOTE — Progress Notes (Signed)
Agree with assessment and documentation by the resident.   Catie Hedwig Morton, PharmD, [facility] [phone]

## 2022-10-22 ENCOUNTER — Other Ambulatory Visit: Payer: Self-pay

## 2022-10-27 ENCOUNTER — Ambulatory Visit (HOSPITAL_BASED_OUTPATIENT_CLINIC_OR_DEPARTMENT_OTHER): Payer: Self-pay | Admitting: Cardiovascular Disease

## 2022-10-27 ENCOUNTER — Other Ambulatory Visit: Payer: Self-pay

## 2022-11-02 ENCOUNTER — Other Ambulatory Visit: Payer: Self-pay

## 2022-11-10 ENCOUNTER — Encounter: Payer: Self-pay | Admitting: Cardiovascular Disease

## 2022-11-18 ENCOUNTER — Other Ambulatory Visit: Payer: Self-pay

## 2022-11-18 ENCOUNTER — Ambulatory Visit (INDEPENDENT_AMBULATORY_CARE_PROVIDER_SITE_OTHER): Payer: Self-pay | Admitting: Nurse Practitioner

## 2022-11-18 ENCOUNTER — Encounter: Payer: Self-pay | Admitting: Nurse Practitioner

## 2022-11-18 VITALS — BP 156/80 | HR 62 | Temp 98.2°F | Ht 66.0 in | Wt 190.0 lb

## 2022-11-18 DIAGNOSIS — I10 Essential (primary) hypertension: Secondary | ICD-10-CM

## 2022-11-18 DIAGNOSIS — E119 Type 2 diabetes mellitus without complications: Secondary | ICD-10-CM

## 2022-11-18 DIAGNOSIS — Z1231 Encounter for screening mammogram for malignant neoplasm of breast: Secondary | ICD-10-CM

## 2022-11-18 LAB — POCT GLYCOSYLATED HEMOGLOBIN (HGB A1C): Hemoglobin A1C: 7.6 % — AB (ref 4.0–5.6)

## 2022-11-18 MED ORDER — ATORVASTATIN CALCIUM 40 MG PO TABS
40.0000 mg | ORAL_TABLET | Freq: Every day | ORAL | 2 refills | Status: DC
  Filled 2022-11-18: qty 30, 30d supply, fill #0

## 2022-11-18 MED ORDER — CANDESARTAN CILEXETIL 32 MG PO TABS
32.0000 mg | ORAL_TABLET | Freq: Every day | ORAL | 2 refills | Status: DC
  Filled 2022-11-18: qty 30, 30d supply, fill #0
  Filled 2023-01-07 – 2023-01-10 (×3): qty 30, 30d supply, fill #1

## 2022-11-18 MED ORDER — HYDRALAZINE HCL 25 MG PO TABS
25.0000 mg | ORAL_TABLET | Freq: Two times a day (BID) | ORAL | 2 refills | Status: DC
  Filled 2022-11-18: qty 60, 30d supply, fill #0
  Filled 2023-03-04 (×2): qty 60, 30d supply, fill #1
  Filled 2023-04-22: qty 60, 30d supply, fill #2

## 2022-11-18 MED ORDER — CHLORTHALIDONE 25 MG PO TABS
25.0000 mg | ORAL_TABLET | Freq: Every day | ORAL | 5 refills | Status: DC
  Filled 2022-11-18: qty 30, 30d supply, fill #0
  Filled 2023-01-07 (×2): qty 30, 30d supply, fill #1
  Filled 2023-03-04: qty 30, 30d supply, fill #2
  Filled 2023-04-22: qty 30, 30d supply, fill #3
  Filled 2023-05-25: qty 30, 30d supply, fill #4

## 2022-11-18 NOTE — Patient Instructions (Signed)
1. Essential hypertension  - chlorthalidone (HYGROTON) 25 MG tablet; Take 1 tablet (25 mg total) by mouth daily.  Dispense: 30 tablet; Refill: 5 - candesartan (ATACAND) 32 MG tablet; Take 1 tablet (32 mg total) by mouth daily.  Dispense: 30 tablet; Refill: 2 - atorvastatin (LIPITOR) 40 MG tablet; Take 1 tablet (40 mg total) by mouth daily.  Dispense: 30 tablet; Refill: 2 - CBC - Comprehensive metabolic panel  2. Type 2 diabetes mellitus without complication, without long-term current use of insulin (HCC) - Microalbumin/Creatinine Ratio, Urine - POCT glycosylated hemoglobin (Hb A1C) - atorvastatin (LIPITOR) 40 MG tablet; Take 1 tablet (40 mg total) by mouth daily.  Dispense: 30 tablet; Refill: 2 - CBC - Comprehensive metabolic panel  3. Primary hypertension  - hydrALAZINE (APRESOLINE) 25 MG tablet; Take 1 tablet (25 mg total) by mouth 2 (two) times daily.  Dispense: 60 tablet; Refill: 2 - CBC - Comprehensive metabolic panel  4. Encounter for screening mammogram for malignant neoplasm of breast  - MM Digital Screening; Future  Follow up:  Follow up in 3 months

## 2022-11-18 NOTE — Progress Notes (Signed)
'@Patient'  ID: Kimberly Herman, female    DOB: 31-May-1966, 56 y.o.   MRN: [id number]  Chief Complaint  Patient presents with   Follow-up    Diabetes f/u--readings (lowest 155, highest-234). Pt stated have not taking B/p 1 week.    Referring provider: Bo Merino I, NP   HPI  Kimberly Herman 56 y.o. female  has a past medical history of Diabetes mellitus without complication ([place]), Hypertension, Menopause (2015), Vaginal polyp (10/2019), and Vitamin D deficiency (08/2019), status post left nephrectomy. To the [facility] for hospital follow up.    Patient presents today for follow-up on diabetes and hypertension. Patient's blood pressure is elevated in the office and we discussed that she was previously on chlorthalidone which can help bring down blood pressure as well.  We will reorder her medications today as she can double check with pharmacy before starting them. Recent A1C was 7.6. Denies f/c/s, n/v/d, hemoptysis, PND, leg swelling Denies chest pain or edema.         Allergies  Allergen Reactions   Amlodipine Swelling   Bactrim [Sulfamethoxazole-Trimethoprim] Nausea Only    Extreme nausea and felt really bad   Carvedilol Diarrhea    Immunization History  Administered Date(s) Administered   PFIZER(Purple Top)SARS-COV-2 Vaccination 05/07/2020, 06/24/2020   PPD Test 07/09/2020    Past Medical History:  Diagnosis Date   Diabetes mellitus without complication ([place])    Hypertension    Menopause 2015   Vaginal polyp 10/2019   Vitamin D deficiency 08/2019    Tobacco History: Social History   Tobacco Use  Smoking Status Never  Smokeless Tobacco Never   Counseling given: Not Answered   Outpatient Encounter Medications as of 11/18/2022  Medication Sig   atorvastatin (LIPITOR) 40 MG tablet Take 1 tablet (40 mg total) by mouth daily.   blood glucose meter kit and supplies KIT Dispense based on patient and insurance preference. Use up to four times daily as directed. (FOR  ICD-9 250.00, 250.01).   Blood Pressure Monitoring (BLOOD PRESSURE CUFF) MISC Check blood pressure once daily at the same time and record readings   candesartan (ATACAND) 32 MG tablet Take 1 tablet (32 mg total) by mouth daily.   chlorthalidone (HYGROTON) 25 MG tablet Take 1 tablet (25 mg total) by mouth daily.   glucose blood (TRUE METRIX BLOOD GLUCOSE TEST) test strip TEST UP TO FOUR TIMES DAILY   hydrALAZINE (APRESOLINE) 25 MG tablet Take 1 tablet (25 mg total) by mouth 2 (two) times daily.   insulin detemir (LEVEMIR) 100 UNIT/ML injection Inject 0.14 mLs (14 Units total) into the skin at bedtime.   Semaglutide (RYBELSUS) 14 MG TABS Take 1 tablet (14 mg total) by mouth daily. (Patient not taking: Reported on 10/13/2022)   Semaglutide (RYBELSUS) 3 MG TABS Take 3 mg by mouth daily. (Patient not taking: Reported on 10/13/2022)   Semaglutide (RYBELSUS) 7 MG TABS Take 7 mg by mouth daily. (Patient not taking: Reported on 10/13/2022)   [DISCONTINUED] atorvastatin (LIPITOR) 40 MG tablet Take 1 tablet (40 mg total) by mouth daily.   [DISCONTINUED] candesartan (ATACAND) 32 MG tablet Take 1 tablet (32 mg total) by mouth daily. (Patient not taking: Reported on 10/13/2022)   [DISCONTINUED] chlorthalidone (HYGROTON) 25 MG tablet Take 1 tablet (25 mg total) by mouth daily.   [DISCONTINUED] hydrALAZINE (APRESOLINE) 25 MG tablet Take 1 tablet (25 mg total) by mouth 2 (two) times daily.   No facility-administered encounter medications on file as of 11/18/2022.  Review of Systems  Review of Systems  Constitutional: Negative.   HENT: Negative.    Cardiovascular: Negative.   Gastrointestinal: Negative.   Allergic/Immunologic: Negative.   Neurological: Negative.   Psychiatric/Behavioral: Negative.         Physical Exam  BP (!) 156/80   Pulse 62   Temp 98.2 F (36.8 C)   Ht '5\' 6"'  (1.676 m)   Wt 190 lb (86.2 kg)   SpO2 95%   BMI 30.67 kg/m   Wt Readings from Last 5 Encounters:  11/18/22  190 lb (86.2 kg)  08/12/22 180 lb 0.4 oz (81.7 kg)  05/19/22 188 lb (85.3 kg)  03/24/22 180 lb (81.6 kg)  03/17/22 176 lb 3.2 oz (79.9 kg)     Physical Exam Vitals and nursing note reviewed.  Constitutional:      General: She is not in acute distress.    Appearance: She is well-developed.  Cardiovascular:     Rate and Rhythm: Normal rate and regular rhythm.  Pulmonary:     Effort: Pulmonary effort is normal.     Breath sounds: Normal breath sounds.  Neurological:     Mental Status: She is alert and oriented to person, place, and time.      Lab Results:  CBC    Component Value Date/Time   WBC 4.3 08/12/2022 0451   RBC 3.23 (L) 08/12/2022 0451   HGB 9.3 (L) 08/12/2022 0451   HGB 12.2 10/19/2021 1204   HCT 28.4 (L) 08/12/2022 0451   HCT 36.1 10/19/2021 1204   PLT 301 08/12/2022 0451   PLT 273 10/19/2021 1204   MCV 87.9 08/12/2022 0451   MCV 87 10/19/2021 1204   MCH 28.8 08/12/2022 0451   MCHC 32.7 08/12/2022 0451   RDW 13.4 08/12/2022 0451   RDW 12.3 10/19/2021 1204   LYMPHSABS 1.5 08/12/2022 0451   LYMPHSABS 1.3 10/19/2021 1204   MONOABS 0.3 08/12/2022 0451   EOSABS 0.2 08/12/2022 0451   EOSABS 0.1 10/19/2021 1204   BASOSABS 0.1 08/12/2022 0451   BASOSABS 0.0 10/19/2021 1204    BMET    Component Value Date/Time   NA 144 08/12/2022 0451   NA 139 10/19/2021 1204   K 3.8 08/12/2022 0451   CL 111 08/12/2022 0451   CO2 28 08/12/2022 0451   GLUCOSE 140 (H) 08/12/2022 0451   BUN 17 08/12/2022 0451   BUN 17 10/19/2021 1204   CREATININE 0.96 08/12/2022 0451   CREATININE 1.05 (H) 03/24/2022 1601   CALCIUM 9.0 08/12/2022 0451   GFRNONAA >60 08/12/2022 0451   GFRNONAA 63 03/04/2017 1543   GFRAA 64 01/02/2021 1650   GFRAA 72 03/04/2017 1543      Assessment & Plan:   HTN (hypertension) - chlorthalidone (HYGROTON) 25 MG tablet; Take 1 tablet (25 mg total) by mouth daily.  Dispense: 30 tablet; Refill: 5 - candesartan (ATACAND) 32 MG tablet; Take 1 tablet  (32 mg total) by mouth daily.  Dispense: 30 tablet; Refill: 2 - atorvastatin (LIPITOR) 40 MG tablet; Take 1 tablet (40 mg total) by mouth daily.  Dispense: 30 tablet; Refill: 2 - CBC - Comprehensive metabolic panel  2. Type 2 diabetes mellitus without complication, without long-term current use of insulin (HCC) - Microalbumin/Creatinine Ratio, Urine - POCT glycosylated hemoglobin (Hb A1C) - atorvastatin (LIPITOR) 40 MG tablet; Take 1 tablet (40 mg total) by mouth daily.  Dispense: 30 tablet; Refill: 2 - CBC - Comprehensive metabolic panel  3. Primary hypertension  - hydrALAZINE (APRESOLINE) 25 MG  tablet; Take 1 tablet (25 mg total) by mouth 2 (two) times daily.  Dispense: 60 tablet; Refill: 2 - CBC - Comprehensive metabolic panel  4. Encounter for screening mammogram for malignant neoplasm of breast  - MM Digital Screening; Future  Follow up:  Follow up in 3 months     Fenton Foy, NP 12/17/2022

## 2022-11-19 ENCOUNTER — Other Ambulatory Visit: Payer: Self-pay

## 2022-11-19 LAB — MICROALBUMIN / CREATININE URINE RATIO
Creatinine, Urine: 135.9 mg/dL
Microalb/Creat Ratio: 52 mg/g creat — ABNORMAL HIGH (ref 0–29)
Microalbumin, Urine: 71 ug/mL

## 2022-11-23 ENCOUNTER — Other Ambulatory Visit: Payer: Self-pay

## 2022-11-30 ENCOUNTER — Telehealth (HOSPITAL_BASED_OUTPATIENT_CLINIC_OR_DEPARTMENT_OTHER): Payer: Self-pay

## 2022-12-08 ENCOUNTER — Encounter: Payer: Self-pay | Admitting: Pharmacist

## 2022-12-08 ENCOUNTER — Other Ambulatory Visit: Payer: Self-pay | Admitting: Pharmacist

## 2022-12-08 NOTE — Progress Notes (Signed)
12/08/2022 Name: Kimberly Herman MRN: [id number] DOB: [birth year]  Chief Complaint  Patient presents with   Medication Management   Diabetes   Hypertension    Kimberly Herman is a 57 y.o. year old female who presented for a telephone visit.   They were referred to the pharmacist by their PCP for assistance in managing diabetes and hypertension.   Subjective:  Care Team: Primary Care Provider: Lazaro Arms Next Scheduled Visit: not scheduled  Medication Access/Adherence  Current Pharmacy:  [id number] [place], [address] AT [facility] OF [place] [address] Phone: [phone] Fax: [place] [address] [place] [address] Phone: [phone] Fax: [phone]   Patient reports affordability concerns with their medications: No  Patient reports access/transportation concerns to their pharmacy: No  Patient reports adherence concerns with their medications:  No     Diabetes:  Current medications: Levemir 14 units daily; currently applying for Rybelsus assistance. Patient notes there was something needed on our end from the provider and information needed on her end.   Current glucose readings: 200-300   Patient denies hypoglycemic s/sx including dizziness, shakiness, sweating. Patient denies hyperglycemic symptoms including polyuria, polydipsia, polyphagia, nocturia, neuropathy, blurred vision.  Current meal patterns: reports she plans to focus more moving forward on cutting back on carbohydrates  Hypertension:  Current medications: candesartan 32 mg daily, chlorthalidone 25 mg daily, hydralazine 25 mg twice daily Medications previously tried: amlodipine - swelling; spironolactone - Scr bump; carvedilol - severe diarrhea    Patient has a validated, automated, upper arm home BP cuff Current blood pressure readings  readings: reports readings consistently in 140-150s at home  Patient denies hypotensive s/sx including dizziness, lightheadedness.  Patient denies hypertensive symptoms including headache, chest pain, shortness of breath    Objective:  Lab Results  Component Value Date   HGBA1C 7.6 (A) 11/18/2022    Lab Results  Component Value Date   CREATININE 0.96 08/12/2022   BUN 17 08/12/2022   NA 144 08/12/2022   K 3.8 08/12/2022   CL 111 08/12/2022   CO2 28 08/12/2022    Lab Results  Component Value Date   CHOL 188 10/01/2021   HDL 40 10/01/2021   LDLCALC 110 (H) 10/01/2021   TRIG 217 (H) 10/01/2021   CHOLHDL 4.7 (H) 10/01/2021    Medications Reviewed Today     Reviewed by Osker Mason, RPH-CPP (Pharmacist) on 12/08/22 at 1333  Med List Status: <None>   Medication Order Taking? Sig Documenting Provider Last Dose Status Informant  atorvastatin (LIPITOR) 40 MG tablet [id number]  Take 1 tablet (40 mg total) by mouth daily. Fenton Foy, NP  Active   blood glucose meter kit and supplies KIT [id number]  Dispense based on patient and insurance preference. Use up to four times daily as directed. (FOR ICD-9 250.00, 250.01). Azzie Glatter, FNP  Active Self  Blood Pressure Monitoring (BLOOD PRESSURE CUFF) MISC [id number]  Check blood pressure once daily at the same time and record readings Scot Jun, FNP  Active Self  candesartan (ATACAND) 32 MG tablet [id number] Yes Take 1 tablet (32 mg total) by mouth daily. Fenton Foy, NP Taking Active   chlorthalidone (HYGROTON) 25 MG tablet [id number] Yes Take 1 tablet (25 mg total) by mouth daily. Fenton Foy, NP Taking Active   glucose blood (TRUE METRIX BLOOD  GLUCOSE TEST) test strip [id number]  TEST UP TO FOUR TIMES DAILY Scot Jun, FNP  Active Self  hydrALAZINE (APRESOLINE) 25 MG tablet [id number] Yes Take 1 tablet (25 mg total) by mouth 2 (two) times daily. Fenton Foy, NP Taking Active   insulin detemir  (LEVEMIR) 100 UNIT/ML injection [id number]  Inject 0.14 mLs (14 Units total) into the skin at bedtime. Fenton Foy, NP  Active   Semaglutide (RYBELSUS) 14 MG TABS [id number]  Take 1 tablet (14 mg total) by mouth daily.  Patient not taking: Reported on 10/13/2022   Fenton Foy, NP  Active   Semaglutide (RYBELSUS) 3 MG TABS [id number]  Take 3 mg by mouth daily.  Patient not taking: Reported on 10/13/2022   Fenton Foy, NP  Active   Semaglutide (RYBELSUS) 7 MG TABS [id number]  Take 7 mg by mouth daily.  Patient not taking: Reported on 10/13/2022   Fenton Foy, NP  Active               Assessment/Plan:   Diabetes: - Currently uncontrolled - Patient to provided required income/household size information to [employer] as requested. Will collaborate with access team on prescription needs - Discussed that Levemir will be ending availability later this year. Will plan to switch to [place] when current Levemir supply is depleted.   Hypertension: - Currently uncontrolled - Reviewed long term cardiovascular and renal outcomes of uncontrolled blood pressure - Reviewed appropriate blood pressure monitoring technique and reviewed goal blood pressure. Recommended to check home blood pressure and heart rate daily - Recommend to increase hydralazine to 50 mg twice daily. Patient would like to continue current regimen and follow readings for another 4 weeks. Follow up scheduled.     Follow Up Plan: phone call in 4 weeks  Catie TJodi Mourning, PharmD, [place], [facility] [phone]

## 2022-12-08 NOTE — Patient Instructions (Signed)
Lanya,   [place] has recently decreased the income cut offs for Medicaid. Check here and see if you and your family qualify and how to apply:   [email or web address]  Check your blood pressure once daily, and any time you have concerning symptoms like headache, chest pain, dizziness, shortness of breath, or vision changes.   Our goal is less than 130/80.  To appropriately check your blood pressure, make sure you do the following:  1) Avoid caffeine, exercise, or tobacco products for 30 minutes before checking. Empty your bladder. 2) Sit with your back supported in a flat-backed chair. Rest your arm on something flat (arm of the chair, table, etc). 3) Sit still with your feet flat on the floor, resting, for at least 5 minutes.  4) Check your blood pressure. Take 1-2 readings.  5) Write down these readings and bring with you to any provider appointments.  Bring your home blood pressure machine with you to a provider's office for accuracy comparison at least once a year.   Make sure you take your blood pressure medications before you come to any office visit, even if you were asked to fast for labs.  Thanks!  Catie Jodi Mourning, PharmD

## 2022-12-17 ENCOUNTER — Encounter: Payer: Self-pay | Admitting: Nurse Practitioner

## 2022-12-17 NOTE — Assessment & Plan Note (Signed)
-  chlorthalidone (HYGROTON) 25 MG tablet; Take 1 tablet (25 mg total) by mouth daily.  Dispense: 30 tablet; Refill: 5 - candesartan (ATACAND) 32 MG tablet; Take 1 tablet (32 mg total) by mouth daily.  Dispense: 30 tablet; Refill: 2 - atorvastatin (LIPITOR) 40 MG tablet; Take 1 tablet (40 mg total) by mouth daily.  Dispense: 30 tablet; Refill: 2 - CBC - Comprehensive metabolic panel  2. Type 2 diabetes mellitus without complication, without long-term current use of insulin (HCC) - Microalbumin/Creatinine Ratio, Urine - POCT glycosylated hemoglobin (Hb A1C) - atorvastatin (LIPITOR) 40 MG tablet; Take 1 tablet (40 mg total) by mouth daily.  Dispense: 30 tablet; Refill: 2 - CBC - Comprehensive metabolic panel  3. Primary hypertension  - hydrALAZINE (APRESOLINE) 25 MG tablet; Take 1 tablet (25 mg total) by mouth 2 (two) times daily.  Dispense: 60 tablet; Refill: 2 - CBC - Comprehensive metabolic panel  4. Encounter for screening mammogram for malignant neoplasm of breast  - MM Digital Screening; Future  Follow up:  Follow up in 3 months

## 2022-12-21 ENCOUNTER — Other Ambulatory Visit (HOSPITAL_BASED_OUTPATIENT_CLINIC_OR_DEPARTMENT_OTHER): Payer: Self-pay

## 2023-01-03 ENCOUNTER — Other Ambulatory Visit: Payer: Self-pay | Admitting: Pharmacist

## 2023-01-03 ENCOUNTER — Other Ambulatory Visit: Payer: Self-pay

## 2023-01-03 ENCOUNTER — Encounter: Payer: Self-pay | Admitting: Pharmacist

## 2023-01-03 MED ORDER — PEN NEEDLES 32G X 4 MM MISC
3 refills | Status: DC
  Filled 2023-01-03: qty 100, 100d supply, fill #0
  Filled 2023-04-08: qty 100, 25d supply, fill #1
  Filled 2023-06-09: qty 100, 33d supply, fill #2
  Filled 2023-07-22: qty 100, 33d supply, fill #3

## 2023-01-03 MED ORDER — BASAGLAR KWIKPEN 100 UNIT/ML ~~LOC~~ SOPN
20.0000 [IU] | PEN_INJECTOR | Freq: Every day | SUBCUTANEOUS | 3 refills | Status: DC
  Filled 2023-01-03: qty 15, 75d supply, fill #0

## 2023-01-03 NOTE — Progress Notes (Signed)
01/03/2023 Name: Kimberly Herman MRN: [id number] DOB: 12-05-1965  Chief Complaint  Patient presents with   Medication Management    Kimberly Herman is a 57 y.o. year old female who presented for a telephone visit.   They were referred to the pharmacist by their PCP for assistance in managing diabetes and hypertension.   Subjective:  Care Team: Primary Care Provider: Fenton Foy, NP ; Next Scheduled Visit: 02/16/23   Medication Access/Adherence  Current Pharmacy:  [facility] [place], [address] AT [facility] OF [address] [place] [address] Phone: [phone] Fax: [place] [address] [place] [address] Phone: [phone] Fax: [phone]   Patient reports affordability concerns with their medications: Yes  Patient reports access/transportation concerns to their pharmacy: No  Patient reports adherence concerns with their medications:  No    Reports she has nothing in her checking account right now, difficult time affording copays.    Diabetes:  Current medications: Levemir 15 units daily; prescribed Rybelsus and applied for patient assistance but has not heard anything back after additional information was provided; patient notes she's tried to call several times but due to her work schedule, she's been unable to call right at 8 am Medications tried in the past: prior history of metformin, patient discontinued due to worries about her kidneys.   Current glucose readings: reports fastings in 300s  Patient denies hypoglycemic s/sx including dizziness, shakiness, sweating.   Hypertension:   Current medications: candesartan 32 mg daily, chlorthalidone 25 mg daily, hydralazine 25 mg twice daily Medications previously tried: amlodipine - swelling; spironolactone - Scr bump; carvedilol - severe diarrhea     Patient has a  validated, automated, upper arm home BP cuff Current blood pressure readings readings: most recent reading 127/82 per her report   Patient denies hypotensive s/sx including dizziness, lightheadedness.  Patient denies hypertensive symptoms including headache, chest pain, shortness of breath   Hyperlipidemia/ASCVD Risk Reduction  Current lipid lowering medications: atorvastatin 40 mg daily  Objective:  Lab Results  Component Value Date   HGBA1C 7.6 (A) 11/18/2022    Lab Results  Component Value Date   CREATININE 0.96 08/12/2022   BUN 17 08/12/2022   NA 144 08/12/2022   K 3.8 08/12/2022   CL 111 08/12/2022   CO2 28 08/12/2022    Lab Results  Component Value Date   CHOL 188 10/01/2021   HDL 40 10/01/2021   LDLCALC 110 (H) 10/01/2021   TRIG 217 (H) 10/01/2021   CHOLHDL 4.7 (H) 10/01/2021    Medications Reviewed Today     Reviewed by Osker Mason, RPH-CPP (Pharmacist) on 01/03/23 at 78  Med List Status: <None>   Medication Order Taking? Sig Documenting Provider Last Dose Status Informant  atorvastatin (LIPITOR) 40 MG tablet [id number] Yes Take 1 tablet (40 mg total) by mouth daily. Fenton Foy, NP Taking Active   blood glucose meter kit and supplies KIT [id number]  Dispense based on patient and insurance preference. Use up to four times daily as directed. (FOR ICD-9 250.00, 250.01). Azzie Glatter, FNP  Active Self  Blood Pressure Monitoring (BLOOD PRESSURE CUFF) MISC [id number]  Check blood pressure once daily at the same time and record readings Scot Jun, NP  Active Self  candesartan (ATACAND) 32 MG tablet [id number] Yes Take 1 tablet (32 mg total) by mouth daily.  Fenton Foy, NP Taking Active   chlorthalidone (HYGROTON) 25 MG tablet [id number] Yes Take 1 tablet (25 mg total) by mouth daily. Fenton Foy, NP Taking Active   glucose blood (TRUE METRIX BLOOD GLUCOSE TEST) test strip [id number]  TEST UP TO FOUR TIMES DAILY Scot Jun, NP   Active Self  hydrALAZINE (APRESOLINE) 25 MG tablet [id number] Yes Take 1 tablet (25 mg total) by mouth 2 (two) times daily. Fenton Foy, NP Taking Active   insulin detemir (LEVEMIR) 100 UNIT/ML injection [id number] Yes Inject 0.14 mLs (14 Units total) into the skin at bedtime. Fenton Foy, NP Taking Active   Semaglutide (RYBELSUS) 14 MG TABS [id number]  Take 1 tablet (14 mg total) by mouth daily.  Patient not taking: Reported on 10/13/2022   Fenton Foy, NP  Active   Semaglutide (RYBELSUS) 3 MG TABS [id number]  Take 3 mg by mouth daily.  Patient not taking: Reported on 10/13/2022   Fenton Foy, NP  Active   Semaglutide (RYBELSUS) 7 MG TABS [id number]  Take 7 mg by mouth daily.  Patient not taking: Reported on 10/13/2022   Fenton Foy, NP  Active               Assessment/Plan:   Diabetes: - Currently uncontrolled - Will collaborate with patient assistance technician team to follow up on Rybelsus application. Due to shorter half life, appropriate to transition Levemir to longer lasting Basaglar. Discussed with PCP; stop Levemir, start Basaglar 20 units daily - Recommend to check glucose twice daily, fasting and 2 hour post prandial  Hypertension: - Currently improved - Reviewed long term cardiovascular and renal outcomes of uncontrolled blood pressure - Reviewed appropriate blood pressure monitoring technique and reviewed goal blood pressure. Recommended to check home blood pressure and heart rate daily - Recommend to continue current regimen at this time  Hyperlipidemia/ASCVD Risk Reduction: - Currently unknown control, due for updated lipid panel - Recommend to continue atorvastatin; check lipid panel with next appointment   Follow Up Plan: phone call in 6 weeks  Catie Hedwig Morton, PharmD, [place], [place] [phone]

## 2023-01-04 ENCOUNTER — Other Ambulatory Visit: Payer: Self-pay

## 2023-01-04 ENCOUNTER — Telehealth (HOSPITAL_BASED_OUTPATIENT_CLINIC_OR_DEPARTMENT_OTHER): Payer: Self-pay

## 2023-01-07 ENCOUNTER — Other Ambulatory Visit: Payer: Self-pay

## 2023-01-10 ENCOUNTER — Other Ambulatory Visit: Payer: Self-pay

## 2023-01-12 ENCOUNTER — Other Ambulatory Visit: Payer: Self-pay

## 2023-02-02 ENCOUNTER — Other Ambulatory Visit: Payer: Self-pay

## 2023-02-02 NOTE — Progress Notes (Signed)
Patient outreached by Georga Kaufmann, PharmD Candidate on 03/12 to discuss hypertension    Patient has an automated home blood pressure machine. They report home readings 127/70 (~2 weeks ago)    Medication review was performed. They are taking *BP* medications as prescribed. Differences from their prescribed list include: Patient is not currently taking Rybelsus for DM   The following barriers to adherence were noted:  - They do have cost concerns. (Rybelsus)  - They do not have transportation concerns.  - They do not need assistance obtaining refills.  - They do not occasionally forget to take some of their prescribed medications.  - They do not feel like one/some of their medications make them feel poorly.  - They do have questions or concerns about their medications.  - They do have follow up scheduled with their primary care provider/cardiologist.   The following interventions were completed:  - Medications were reviewed  - Patient was educated on goal blood pressures and long term health implications of elevated blood pressure  - Patient was educated on proper technique to check home blood pressure and reminded to bring home machine and readings to next provider appointment  - Patient was educated on medications, including indication and administration   The patient has follow up scheduled: 02/16/23  PCP: Lazaro Arms, NP   Georga Kaufmann, PharmD Candidate   Maryan Puls, PharmD PGY-1 [facility] Pharmacy Resident

## 2023-02-10 ENCOUNTER — Encounter: Payer: Self-pay | Admitting: Pharmacist

## 2023-02-14 ENCOUNTER — Other Ambulatory Visit: Payer: Self-pay | Admitting: Pharmacist

## 2023-02-14 ENCOUNTER — Other Ambulatory Visit: Payer: Self-pay

## 2023-02-14 DIAGNOSIS — E119 Type 2 diabetes mellitus without complications: Secondary | ICD-10-CM

## 2023-02-14 MED ORDER — BASAGLAR KWIKPEN 100 UNIT/ML ~~LOC~~ SOPN
24.0000 [IU] | PEN_INJECTOR | Freq: Every day | SUBCUTANEOUS | 3 refills | Status: DC
  Filled 2023-02-14 – 2023-04-08 (×3): qty 15, 62d supply, fill #0
  Filled 2023-04-22: qty 6, 25d supply, fill #0

## 2023-02-14 MED ORDER — RYBELSUS 3 MG PO TABS
3.0000 mg | ORAL_TABLET | Freq: Every day | ORAL | 2 refills | Status: DC
  Filled 2023-02-14 – 2023-02-15 (×2): qty 30, 30d supply, fill #0

## 2023-02-14 NOTE — Patient Instructions (Signed)
Kimberly Herman,   Increase Basaglar to 24 units daily.   Add Rybelsus 3 mg once daily. Take this medication first thing in the morning on an empty stomach. Wait 30 minutes before food or other medications.   Check your blood sugars twice daily:  1) Fasting, first thing in the morning before breakfast and  2) 2 hours after your largest meal.   For a goal A1c of less than 7%, goal fasting readings are less than 130 and goal 2 hour after meal readings are less than 180.    Catie Hedwig Morton, PharmD, [place], [facility] [phone]

## 2023-02-14 NOTE — Progress Notes (Signed)
02/14/2023 Name: Kimberly Herman MRN: [id number] DOB: [birth year]  Chief Complaint  Patient presents with   Medication Management   Diabetes   Hypertension    Kimberly Herman is a 57 y.o. year old female who presented for a telephone visit.   They were referred to the pharmacist by their PCP for assistance in managing diabetes and hypertension.   Subjective:  Care Team: Primary Care Provider: Fenton Foy, NP ; Next Scheduled Visit: 02/16/23  Medication Access/Adherence  Current Pharmacy:  [facility] [place], [place] [address] AT [facility] [address] [place] [address] Phone: [phone] Fax: [place] [employer], [place] [address] Phone: [phone] Fax: [phone]   Patient reports affordability concerns with their medications: No  Patient reports access/transportation concerns to their pharmacy: No  Patient reports adherence concerns with their medications:  No     Diabetes:  Current medications: Basaglar 20 units daily, prescribed Rybelsus but has not followed up with [employer] patient assistance; she does note that she has picked up an insurance plan  Medications tried in the past: previously discontinued metformin due to her concern about her kidneys  Current glucose readings: fastings in 250s  Hypertension:  Current medications: candesartan 32 mg daily, chlorthalidone 25 mg daily, hydralazine 25 mg twice daily Medications previously tried: amlodipine - swelling; spironolactone - Scr bump; carvedilol - severe diarrhea    Patient has a validated, automated, upper arm home BP cuff Current blood pressure readings readings: does not remember most recent reading, will call me later with this.   Patient denies hypotensive s/sx including dizziness, lightheadedness.  Patient denies hypertensive symptoms including  headache, chest pain, shortness of breath    Hyperlipidemia/ASCVD Risk Reduction  Current lipid lowering medications: atorvastatin 40 mg daily  Objective:  Lab Results  Component Value Date   HGBA1C 7.6 (A) 11/18/2022    Lab Results  Component Value Date   CREATININE 0.96 08/12/2022   BUN 17 08/12/2022   NA 144 08/12/2022   K 3.8 08/12/2022   CL 111 08/12/2022   CO2 28 08/12/2022    Lab Results  Component Value Date   CHOL 188 10/01/2021   HDL 40 10/01/2021   LDLCALC 110 (H) 10/01/2021   TRIG 217 (H) 10/01/2021   CHOLHDL 4.7 (H) 10/01/2021    Medications Reviewed Today     Reviewed by Osker Mason, RPH-CPP (Pharmacist) on 01/03/23 at 47  Med List Status: <None>   Medication Order Taking? Sig Documenting Provider Last Dose Status Informant  atorvastatin (LIPITOR) 40 MG tablet [id number] Yes Take 1 tablet (40 mg total) by mouth daily. Fenton Foy, NP Taking Active   blood glucose meter kit and supplies KIT [id number]  Dispense based on patient and insurance preference. Use up to four times daily as directed. (FOR ICD-9 250.00, 250.01). Azzie Glatter, FNP  Active Self  Blood Pressure Monitoring (BLOOD PRESSURE CUFF) MISC [id number]  Check blood pressure once daily at the same time and record readings Scot Jun, NP  Active Self  candesartan (ATACAND) 32 MG tablet [id number] Yes Take 1 tablet (32 mg total) by mouth daily. Fenton Foy, NP Taking Active   chlorthalidone (HYGROTON) 25 MG tablet [id number] Yes Take 1 tablet (25 mg total) by mouth daily. Fenton Foy, NP Taking Active   glucose blood (TRUE METRIX BLOOD GLUCOSE  TEST) test strip [id number]  TEST UP TO FOUR TIMES DAILY Scot Jun, NP  Active Self  hydrALAZINE (APRESOLINE) 25 MG tablet [id number] Yes Take 1 tablet (25 mg total) by mouth 2 (two) times daily. Fenton Foy, NP Taking Active   insulin detemir (LEVEMIR) 100 UNIT/ML injection [id number] Yes Inject 0.14 mLs (14 Units  total) into the skin at bedtime. Fenton Foy, NP Taking Active   Semaglutide (RYBELSUS) 14 MG TABS [id number]  Take 1 tablet (14 mg total) by mouth daily.  Patient not taking: Reported on 10/13/2022   Fenton Foy, NP  Active   Semaglutide (RYBELSUS) 3 MG TABS [id number]  Take 3 mg by mouth daily.  Patient not taking: Reported on 10/13/2022   Fenton Foy, NP  Active   Semaglutide (RYBELSUS) 7 MG TABS [id number]  Take 7 mg by mouth daily.  Patient not taking: Reported on 10/13/2022   Fenton Foy, NP  Active               Assessment/Plan:   Diabetes: - Currently uncontrolled - Increase Basaglar to 24 units daily, order placed for PCP co-sign if in agreement. Patient will take her new insurance card to the pharmacy to fill Rybelsus. Will recommend to add SGLT2 once improvement in glucose readings. Patient also asks about Bartolo Darter today; will focus on glucose lowering medications first.  - Recommend to check glucose twice daily, fasting and 2 hour post prandial.  Hypertension: - Currently controlled per her home reports.  - Reviewed long term cardiovascular and renal outcomes of uncontrolled blood pressure - Reviewed appropriate blood pressure monitoring technique and reviewed goal blood pressure. Recommended to check home blood pressure and heart rate daily - Recommend to continue current regimen at this time   Hyperlipidemia/ASCVD Risk Reduction: - Currently controlled per last lipid panel, but due for update.  - Recommend to continue current regimen at this time   Follow Up Plan: phone call in 6 weeks  Catie Hedwig Morton, PharmD, [place], [place] [phone]

## 2023-02-15 ENCOUNTER — Other Ambulatory Visit: Payer: Self-pay

## 2023-02-15 MED ORDER — TRULICITY 0.75 MG/0.5ML ~~LOC~~ SOAJ
0.7500 mg | SUBCUTANEOUS | 2 refills | Status: DC
  Filled 2023-02-15: qty 2, 28d supply, fill #0

## 2023-02-15 NOTE — Addendum Note (Signed)
Addended by: Kaylyn Layer T on: 02/15/2023 01:44 PM   Modules accepted: Orders

## 2023-02-15 NOTE — Progress Notes (Signed)
Care Coordination Call  Pharmacy notified that patient's new insurance is a high deductible plan. Notes that Trulicity and Victoza are preferred over Rybelsus, though will likely be expensive.   Script for [employer] sent for co-sign.   Catie Hedwig Morton, PharmD, [place], [facility] [phone]

## 2023-02-16 ENCOUNTER — Encounter: Payer: Self-pay | Admitting: Nurse Practitioner

## 2023-02-16 ENCOUNTER — Ambulatory Visit (INDEPENDENT_AMBULATORY_CARE_PROVIDER_SITE_OTHER): Payer: 59 | Admitting: Nurse Practitioner

## 2023-02-16 ENCOUNTER — Other Ambulatory Visit: Payer: Self-pay

## 2023-02-16 VITALS — BP 117/66 | HR 69 | Temp 97.0°F | Wt 184.0 lb

## 2023-02-16 DIAGNOSIS — Z1211 Encounter for screening for malignant neoplasm of colon: Secondary | ICD-10-CM

## 2023-02-16 DIAGNOSIS — Z1231 Encounter for screening mammogram for malignant neoplasm of breast: Secondary | ICD-10-CM | POA: Diagnosis not present

## 2023-02-16 DIAGNOSIS — E119 Type 2 diabetes mellitus without complications: Secondary | ICD-10-CM | POA: Diagnosis not present

## 2023-02-16 DIAGNOSIS — Z905 Acquired absence of kidney: Secondary | ICD-10-CM

## 2023-02-16 LAB — POCT GLYCOSYLATED HEMOGLOBIN (HGB A1C): Hemoglobin A1C: 12.7 % — AB (ref 4.0–5.6)

## 2023-02-16 NOTE — Progress Notes (Unsigned)
@Patient  ID: Kimberly Herman, female    DOB: 11-14-1966, 57 y.o.   MRN: [id number]  Chief Complaint  Patient presents with   Follow-up   Hypertension   Diabetes    Referring provider: Fenton Foy, NP   HPI  Kimberly Herman 57 y.o. female  has a past medical history of Diabetes mellitus without complication ([place]), Hypertension, Menopause (2015), Vaginal polyp (10/2019), and Vitamin D deficiency (08/2019), status post left nephrectomy. To the [facility] for hospital follow up.    Patient presents today for follow-up on diabetes and hypertension. Patient states that she is compliant with her medications.  She is currently only taking Engineer, agricultural.  She refuses metformin.  Her insurance will not cover Rybelsus or Trulicity that we have been trying to prescribe for her.  She is followed by pharmacy for medication management.  She was told by pharmacy to increase her Basaglar to 24 units daily but she has failed to do this.  She states that she will start this today.  Last A1C 3 months ago was 7.6.  Her A1c is at 12.7 today.  We will place a referral to endocrinology for uncontrolled diabetes.  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  We will place order denies f/c/s, n/v/d, hemoptysis, PND, leg swelling Denies chest pain or edema.      Allergies  Allergen Reactions   Amlodipine Swelling   Bactrim [Sulfamethoxazole-Trimethoprim] Nausea Only    Extreme nausea and felt really bad   Carvedilol Diarrhea    Immunization History  Administered Date(s) Administered   PFIZER(Purple Top)SARS-COV-2 Vaccination 05/07/2020, 06/24/2020   PPD Test 07/09/2020    Past Medical History:  Diagnosis Date   Diabetes mellitus without complication ([place])    Hypertension    Menopause 2015   Vaginal polyp 10/2019   Vitamin D deficiency 08/2019    Tobacco History: Social History   Tobacco Use  Smoking Status Never  Smokeless Tobacco Never    Counseling given: Not Answered   Outpatient Encounter Medications as of 02/16/2023  Medication Sig   atorvastatin (LIPITOR) 40 MG tablet Take 1 tablet (40 mg total) by mouth daily.   blood glucose meter kit and supplies KIT Dispense based on patient and insurance preference. Use up to four times daily as directed. (FOR ICD-9 250.00, 250.01).   Blood Pressure Monitoring (BLOOD PRESSURE CUFF) MISC Check blood pressure once daily at the same time and record readings   candesartan (ATACAND) 32 MG tablet Take 1 tablet (32 mg total) by mouth daily.   chlorthalidone (HYGROTON) 25 MG tablet Take 1 tablet (25 mg total) by mouth daily.   glucose blood (TRUE METRIX BLOOD GLUCOSE TEST) test strip TEST UP TO FOUR TIMES DAILY   hydrALAZINE (APRESOLINE) 25 MG tablet Take 1 tablet (25 mg total) by mouth 2 (two) times daily.   Insulin Glargine (BASAGLAR KWIKPEN) 100 UNIT/ML Inject 24 Units into the skin daily.   Insulin Pen Needle (PEN NEEDLES) 32G X 4 MM MISC Use to inject insulin daily   Dulaglutide (TRULICITY) A999333 0000000 SOPN Inject 0.75 mg into the skin once a week. (Patient not taking: Reported on 02/16/2023)   Semaglutide (RYBELSUS) 14 MG TABS Take 1 tablet (14 mg total) by mouth daily. (Patient not taking: Reported on 10/13/2022)   Semaglutide (RYBELSUS) 3 MG TABS Take 1 tablet (3 mg total) by mouth daily. (Patient not taking: Reported on 02/16/2023)   Semaglutide (RYBELSUS) 7 MG  TABS Take 7 mg by mouth daily. (Patient not taking: Reported on 10/13/2022)   No facility-administered encounter medications on file as of 02/16/2023.     Review of Systems  Review of Systems  Constitutional: Negative.   HENT: Negative.    Cardiovascular: Negative.   Gastrointestinal: Negative.   Allergic/Immunologic: Negative.   Neurological: Negative.   Psychiatric/Behavioral: Negative.         Physical Exam  BP 117/66   Pulse 69   Temp (!) 97 F (36.1 C)   Wt 184 lb (83.5 kg)   SpO2 99%   BMI 29.70  kg/m   Wt Readings from Last 5 Encounters:  02/16/23 184 lb (83.5 kg)  11/18/22 190 lb (86.2 kg)  08/12/22 180 lb 0.4 oz (81.7 kg)  05/19/22 188 lb (85.3 kg)  03/24/22 180 lb (81.6 kg)     Physical Exam Vitals and nursing note reviewed.  Constitutional:      General: She is not in acute distress.    Appearance: She is well-developed.  Cardiovascular:     Rate and Rhythm: Normal rate and regular rhythm.  Pulmonary:     Effort: Pulmonary effort is normal.     Breath sounds: Normal breath sounds.  Neurological:     Mental Status: She is alert and oriented to person, place, and time.      Lab Results:  CBC    Component Value Date/Time   WBC 4.7 02/16/2023 1539   WBC 4.3 08/12/2022 0451   RBC 4.27 02/16/2023 1539   RBC 3.23 (L) 08/12/2022 0451   HGB 12.7 02/16/2023 1539   HCT 38.4 02/16/2023 1539   PLT 160 02/16/2023 1539   MCV 90 02/16/2023 1539   MCH 29.7 02/16/2023 1539   MCH 28.8 08/12/2022 0451   MCHC 33.1 02/16/2023 1539   MCHC 32.7 08/12/2022 0451   RDW 11.9 02/16/2023 1539   LYMPHSABS 1.5 08/12/2022 0451   LYMPHSABS 1.3 10/19/2021 1204   MONOABS 0.3 08/12/2022 0451   EOSABS 0.2 08/12/2022 0451   EOSABS 0.1 10/19/2021 1204   BASOSABS 0.1 08/12/2022 0451   BASOSABS 0.0 10/19/2021 1204    BMET    Component Value Date/Time   NA 133 (L) 02/16/2023 1539   K 3.9 02/16/2023 1539   CL 92 (L) 02/16/2023 1539   CO2 29 02/16/2023 1539   GLUCOSE 678 (HH) 02/16/2023 1539   GLUCOSE 140 (H) 08/12/2022 0451   BUN 19 02/16/2023 1539   CREATININE 1.44 (H) 02/16/2023 1539   CREATININE 1.05 (H) 03/24/2022 1601   CALCIUM 9.6 02/16/2023 1539   GFRNONAA >60 08/12/2022 0451   GFRNONAA 63 03/04/2017 1543   GFRAA 64 01/02/2021 1650   GFRAA 72 03/04/2017 1543    BNP No results found for: "BNP"  ProBNP No results found for: "PROBNP"  Imaging: No results found.   Assessment & Plan:   T2DM (type 2 diabetes mellitus) ([place]) - POCT glycosylated hemoglobin (Hb  A1C) - Ambulatory referral to Ophthalmology - Ambulatory referral to Podiatry - Ambulatory referral to Endocrinology - CBC - Comprehensive metabolic panel - Ambulatory referral to Nephrology  2. Colon cancer screening  - Cologuard  3. Encounter for screening mammogram for malignant neoplasm of breast  - MM Digital Screening  4. H/O left nephrectomy  - CBC - Comprehensive metabolic panel - Ambulatory referral to Nephrology   Follow up:  Follow up in 3 months     Fenton Foy, NP 02/17/2023

## 2023-02-16 NOTE — Patient Instructions (Addendum)
1. Type 2 diabetes mellitus without complication, without long-term current use of insulin (HCC)  - POCT glycosylated hemoglobin (Hb A1C) - Ambulatory referral to Ophthalmology - Ambulatory referral to Podiatry - Ambulatory referral to Endocrinology - CBC - Comprehensive metabolic panel - Ambulatory referral to Nephrology  2. Colon cancer screening  - Cologuard  3. Encounter for screening mammogram for malignant neoplasm of breast  - MM Digital Screening  4. H/O left nephrectomy  - CBC - Comprehensive metabolic panel - Ambulatory referral to Nephrology   Follow up:  Follow up in 3 months

## 2023-02-17 ENCOUNTER — Other Ambulatory Visit: Payer: Self-pay | Admitting: Pharmacist

## 2023-02-17 ENCOUNTER — Telehealth: Payer: Self-pay | Admitting: Nurse Practitioner

## 2023-02-17 ENCOUNTER — Telehealth: Payer: Self-pay

## 2023-02-17 ENCOUNTER — Other Ambulatory Visit: Payer: Self-pay | Admitting: Nurse Practitioner

## 2023-02-17 ENCOUNTER — Other Ambulatory Visit: Payer: Self-pay

## 2023-02-17 LAB — COMPREHENSIVE METABOLIC PANEL
ALT: 25 IU/L (ref 0–32)
AST: 17 IU/L (ref 0–40)
Albumin/Globulin Ratio: 1.2 (ref 1.2–2.2)
Albumin: 4.2 g/dL (ref 3.8–4.9)
Alkaline Phosphatase: 180 IU/L — ABNORMAL HIGH (ref 44–121)
BUN/Creatinine Ratio: 13 (ref 9–23)
BUN: 19 mg/dL (ref 6–24)
Bilirubin Total: 0.4 mg/dL (ref 0.0–1.2)
CO2: 29 mmol/L (ref 20–29)
Calcium: 9.6 mg/dL (ref 8.7–10.2)
Chloride: 92 mmol/L — ABNORMAL LOW (ref 96–106)
Creatinine, Ser: 1.44 mg/dL — ABNORMAL HIGH (ref 0.57–1.00)
Globulin, Total: 3.4 g/dL (ref 1.5–4.5)
Glucose: 678 mg/dL (ref 70–99)
Potassium: 3.9 mmol/L (ref 3.5–5.2)
Sodium: 133 mmol/L — ABNORMAL LOW (ref 134–144)
Total Protein: 7.6 g/dL (ref 6.0–8.5)
eGFR: 42 mL/min/{1.73_m2} — ABNORMAL LOW (ref >59)

## 2023-02-17 LAB — CBC
Hematocrit: 38.4 % (ref 34.0–46.6)
Hemoglobin: 12.7 g/dL (ref 11.1–15.9)
MCH: 29.7 pg (ref 26.6–33.0)
MCHC: 33.1 g/dL (ref 31.5–35.7)
MCV: 90 fL (ref 79–97)
Platelets: 160 10*3/uL (ref 150–450)
RBC: 4.27 x10E6/uL (ref 3.77–5.28)
RDW: 11.9 % (ref 11.7–15.4)
WBC: 4.7 10*3/uL (ref 3.4–10.8)

## 2023-02-17 MED ORDER — FLUCONAZOLE 150 MG PO TABS: 150.0000 mg | ORAL_TABLET | Freq: Every day | ORAL | 1 refills | Status: DC

## 2023-02-17 NOTE — Progress Notes (Signed)
Done KH 

## 2023-02-17 NOTE — Telephone Encounter (Signed)
[employer] authorization that needs your signature for this pt is in your folder 02/17/23

## 2023-02-17 NOTE — Progress Notes (Signed)
Care Coordination Call  Notified by CPhT that patient's PA for Trulicity was denied, even though she meets both of the criteria noted on the denial form. Patient has failed metformin therapy and A1c is greater than 7.5%.   Contacted insurance. Spoke with a representative and started a PA for Ozempic. They require a copy of her recent labs and chart note.   Fax labs and chart [phone]; case number: [id number]. Will collaborate with team to complete this.   Catie Hedwig Morton, PharmD, [place], [facility] [phone]

## 2023-02-17 NOTE — Telephone Encounter (Signed)
Pt has critical glucose of 678. Please advise [facility]

## 2023-02-17 NOTE — Assessment & Plan Note (Signed)
-   POCT glycosylated hemoglobin (Hb A1C) - Ambulatory referral to Ophthalmology - Ambulatory referral to Podiatry - Ambulatory referral to Endocrinology - CBC - Comprehensive metabolic panel - Ambulatory referral to Nephrology  2. Colon cancer screening  - Cologuard  3. Encounter for screening mammogram for malignant neoplasm of breast  - MM Digital Screening  4. H/O left nephrectomy  - CBC - Comprehensive metabolic panel - Ambulatory referral to Nephrology   Follow up:  Follow up in 3 months

## 2023-02-18 ENCOUNTER — Encounter (HOSPITAL_BASED_OUTPATIENT_CLINIC_OR_DEPARTMENT_OTHER): Payer: Self-pay | Admitting: Emergency Medicine

## 2023-02-18 ENCOUNTER — Other Ambulatory Visit: Payer: Self-pay

## 2023-02-18 ENCOUNTER — Emergency Department (HOSPITAL_BASED_OUTPATIENT_CLINIC_OR_DEPARTMENT_OTHER)
Admission: EM | Admit: 2023-02-18 | Discharge: 2023-02-18 | Disposition: A | Discharge status: Home / Self Care | Payer: 59 | Attending: Emergency Medicine | Admitting: Emergency Medicine

## 2023-02-18 DIAGNOSIS — N179 Acute kidney failure, unspecified: Secondary | ICD-10-CM | POA: Diagnosis not present

## 2023-02-18 DIAGNOSIS — R3589 Other polyuria: Secondary | ICD-10-CM | POA: Insufficient documentation

## 2023-02-18 DIAGNOSIS — I10 Essential (primary) hypertension: Secondary | ICD-10-CM | POA: Diagnosis not present

## 2023-02-18 DIAGNOSIS — Z794 Long term (current) use of insulin: Secondary | ICD-10-CM | POA: Diagnosis not present

## 2023-02-18 DIAGNOSIS — R631 Polydipsia: Secondary | ICD-10-CM | POA: Diagnosis not present

## 2023-02-18 DIAGNOSIS — E1165 Type 2 diabetes mellitus with hyperglycemia: Secondary | ICD-10-CM | POA: Insufficient documentation

## 2023-02-18 DIAGNOSIS — R739 Hyperglycemia, unspecified: Secondary | ICD-10-CM

## 2023-02-18 LAB — BASIC METABOLIC PANEL
Anion gap: 11 (ref 5–15)
BUN: 24 mg/dL — ABNORMAL HIGH (ref 6–20)
CO2: 30 mmol/L (ref 22–32)
Calcium: 9.3 mg/dL (ref 8.9–10.3)
Chloride: 94 mmol/L — ABNORMAL LOW (ref 98–111)
Creatinine, Ser: 1.42 mg/dL — ABNORMAL HIGH (ref 0.44–1.00)
GFR, Estimated: 43 mL/min — ABNORMAL LOW (ref >60)
Glucose, Bld: 427 mg/dL — ABNORMAL HIGH (ref 70–99)
Potassium: 3.5 mmol/L (ref 3.5–5.1)
Sodium: 135 mmol/L (ref 135–145)

## 2023-02-18 LAB — I-STAT VENOUS BLOOD GAS, ED
Acid-Base Excess: 6 mmol/L — ABNORMAL HIGH (ref 0.0–2.0)
Bicarbonate: 33 mmol/L — ABNORMAL HIGH (ref 20.0–28.0)
Calcium, Ion: 1.2 mmol/L (ref 1.15–1.40)
HCT: 40 % (ref 36.0–46.0)
Hemoglobin: 13.6 g/dL (ref 12.0–15.0)
O2 Saturation: 41 %
Patient temperature: 98.3
Potassium: 4 mmol/L (ref 3.5–5.1)
Sodium: 137 mmol/L (ref 135–145)
TCO2: 35 mmol/L — ABNORMAL HIGH (ref 22–32)
pCO2, Ven: 54.5 mmHg (ref 44–60)
pH, Ven: 7.389 (ref 7.25–7.43)
pO2, Ven: 24 mmHg — CL (ref 32–45)

## 2023-02-18 LAB — URINALYSIS, ROUTINE W REFLEX MICROSCOPIC
Bilirubin Urine: NEGATIVE
Glucose, UA: >=500 mg/dL — AB
Hgb urine dipstick: NEGATIVE
Ketones, ur: NEGATIVE mg/dL
Nitrite: NEGATIVE
Protein, ur: NEGATIVE mg/dL
Specific Gravity, Urine: 1.01 (ref 1.005–1.030)
pH: 6 (ref 5.0–8.0)

## 2023-02-18 LAB — CBC
HCT: 38.2 % (ref 36.0–46.0)
Hemoglobin: 13.5 g/dL (ref 12.0–15.0)
MCH: 30.1 pg (ref 26.0–34.0)
MCHC: 35.3 g/dL (ref 30.0–36.0)
MCV: 85.3 fL (ref 80.0–100.0)
Platelets: 193 10*3/uL (ref 150–400)
RBC: 4.48 MIL/uL (ref 3.87–5.11)
RDW: 11.9 % (ref 11.5–15.5)
WBC: 7 10*3/uL (ref 4.0–10.5)
nRBC: 0 % (ref 0.0–0.2)

## 2023-02-18 LAB — CBG MONITORING, ED
Glucose-Capillary: 459 mg/dL — ABNORMAL HIGH (ref 70–99)
Glucose-Capillary: 352 mg/dL — ABNORMAL HIGH (ref 70–99)

## 2023-02-18 LAB — URINALYSIS, MICROSCOPIC (REFLEX)

## 2023-02-18 MED ORDER — LACTATED RINGERS IV BOLUS
500.0000 mL | Freq: Once | INTRAVENOUS | Status: AC
  Administered 2023-02-18: 500 mL via INTRAVENOUS

## 2023-02-18 MED ORDER — LACTATED RINGERS IV BOLUS
1000.0000 mL | Freq: Once | INTRAVENOUS | Status: AC
  Administered 2023-02-18: 1000 mL via INTRAVENOUS

## 2023-02-18 MED ORDER — INSULIN ASPART 100 UNIT/ML IV SOLN
10.0000 [IU] | Freq: Once | INTRAVENOUS | Status: AC
  Administered 2023-02-18: 10 [IU] via INTRAVENOUS

## 2023-02-18 NOTE — ED Provider Notes (Signed)
[place] EMERGENCY [facility]PART[facility] AT [facility] Provider Note   CSN: [id number] Arrival date & time: 02/18/23  1337     History  Chief Complaint  Patient presents with   Hyperglycemia    Kimberly Herman is a 57 y.o. female.  With PMH of DM2, HTN, history of nephrectomy who presents with hyperglycemia she had labs drawn 2 days ago showing her glucose was 678 she is otherwise asymptomatic.  She is supposed be on Basaglar nightly with a dose recently increased to the 20s however she has not been taking it for the past 2 to 3 weeks because she felt like it was not doing anything for her sugars.  She was also supposed to be on Rybelsus but her pharmacy has not been filling because her insurance will not cover for it.  She is otherwise feeling fine.  She has a yeast infection currently which she took fluconazole for.  She has had some mild polyuria and polydipsia but denies any pain with urination or change in color of urine.  She has had no fevers, no vomitings, no pain.   Hyperglycemia      Home Medications Prior to Admission medications   Medication Sig Start Date End Date Taking? Authorizing Provider  atorvastatin (LIPITOR) 40 MG tablet Take 1 tablet (40 mg total) by mouth daily. 11/18/22 02/16/23  Fenton Foy, NP  blood glucose meter kit and supplies KIT Dispense based on patient and insurance preference. Use up to four times daily as directed. (FOR ICD-9 250.00, 250.01). 08/29/19   Azzie Glatter, FNP  Blood Pressure Monitoring (BLOOD PRESSURE CUFF) MISC Check blood pressure once daily at the same time and record readings 03/07/17   Scot Jun, NP  candesartan (ATACAND) 32 MG tablet Take 1 tablet (32 mg total) by mouth daily. 11/18/22 02/16/23  Fenton Foy, NP  chlorthalidone (HYGROTON) 25 MG tablet Take 1 tablet (25 mg total) by mouth daily. 11/18/22 05/17/23  Fenton Foy, NP  Dulaglutide (TRULICITY) A999333 0000000 SOPN Inject 0.75 mg into the skin once a  week. Patient not taking: Reported on 02/16/2023 02/15/23   Fenton Foy, NP  fluconazole (DIFLUCAN) 150 MG tablet Take 1 tablet (150 mg total) by mouth daily. 02/17/23   Fenton Foy, NP  glucose blood (TRUE METRIX BLOOD GLUCOSE TEST) test strip TEST UP TO FOUR TIMES DAILY 05/11/17   Scot Jun, NP  hydrALAZINE (APRESOLINE) 25 MG tablet Take 1 tablet (25 mg total) by mouth 2 (two) times daily. 11/18/22   Fenton Foy, NP  Insulin Glargine (BASAGLAR KWIKPEN) 100 UNIT/ML Inject 24 Units into the skin daily. 02/14/23   Fenton Foy, NP  Insulin Pen Needle (PEN NEEDLES) 32G X 4 MM MISC Use to inject insulin daily 01/03/23   Fenton Foy, NP  Semaglutide (RYBELSUS) 14 MG TABS Take 1 tablet (14 mg total) by mouth daily. Patient not taking: Reported on 10/13/2022 09/16/22   Fenton Foy, NP  Semaglutide (RYBELSUS) 3 MG TABS Take 1 tablet (3 mg total) by mouth daily. Patient not taking: Reported on 02/16/2023 02/14/23   Fenton Foy, NP  Semaglutide (RYBELSUS) 7 MG TABS Take 7 mg by mouth daily. Patient not taking: Reported on 10/13/2022 09/16/22   Fenton Foy, NP      Allergies    Amlodipine, Bactrim [sulfamethoxazole-trimethoprim], and Carvedilol    Review of Systems   Review of Systems  Physical Exam Updated Vital Signs BP 117/69  Pulse 65   Temp 98.3 F (36.8 C) (Oral)   Resp 16   Ht 5\' 6"  (1.676 m)   Wt 85.3 kg   SpO2 96%   BMI 30.34 kg/m  Physical Exam Constitutional: Alert and oriented. Well appearing and in no distress. Eyes: Conjunctivae are normal. ENT      Head: Normocephalic and atraumatic.      Mouth/Throat: Mucous membranes are moist. Cardiovascular: S1, S2, regular rate and rhythm Respiratory: Normal respiratory effort.  O2 sat 98 on RA Gastrointestinal: Soft and nontender. There is no CVA tenderness. Musculoskeletal: Normal range of motion in all extremities. No pain edema of lower extremities Neurologic: Normal speech and  language.  No facial droop.  Moving all extremities equally.  Sensation grossly intact.  No gross focal neurologic deficits are appreciated. Skin: Skin is warm, dry and intact. No rash noted. Psychiatric: Mood and affect are normal. Speech and behavior are normal.  ED Results / Procedures / Treatments   Labs (all labs ordered are listed, but only abnormal results are displayed) Labs Reviewed  BASIC METABOLIC PANEL - Abnormal; Notable for the following components:      Result Value   Chloride 94 (*)    Glucose, Bld 427 (*)    BUN 24 (*)    Creatinine, Ser 1.42 (*)    GFR, Estimated 43 (*)    All other components within normal limits  URINALYSIS, ROUTINE W REFLEX MICROSCOPIC - Abnormal; Notable for the following components:   Glucose, UA >=500 (*)    Leukocytes,Ua TRACE (*)    All other components within normal limits  URINALYSIS, MICROSCOPIC (REFLEX) - Abnormal; Notable for the following components:   Bacteria, UA FEW (*)    All other components within normal limits  CBG MONITORING, ED - Abnormal; Notable for the following components:   Glucose-Capillary 459 (*)    All other components within normal limits  CBG MONITORING, ED - Abnormal; Notable for the following components:   Glucose-Capillary 352 (*)    All other components within normal limits  I-STAT VENOUS BLOOD GAS, ED - Abnormal; Notable for the following components:   pO2, Ven 24 (*)    Bicarbonate 33.0 (*)    TCO2 35 (*)    Acid-Base Excess 6.0 (*)    All other components within normal limits  CBC  BLOOD GAS, VENOUS  CBG MONITORING, ED    EKG None  Radiology No results found.  Procedures Procedures  Remain on constant cardiac monitoring, sinus rhythm with normal rates.  Medications Ordered in ED Medications  lactated ringers bolus 1,000 mL (0 mLs Intravenous Stopped 02/18/23 1552)  insulin aspart (novoLOG) injection 10 Units (10 Units Intravenous Given 02/18/23 1554)  lactated ringers bolus 500 mL (0 mLs  Intravenous Stopped 02/18/23 1628)    ED Course/ Medical Decision Making/ A&P    Medical Decision Making Kimberly Herman is a 57 y.o. female.  With PMH of DM2, HTN, history of nephrectomy who presents with hyperglycemia she had labs drawn 2 days ago showing her glucose was 678 she is otherwise asymptomatic.   Patient hyperglycemic due to noncompliance with diabetes medications.  She is hemodynamically stable and nontoxic and asymptomatic other than mild polyuria and polydipsia.  Her glucose is 427 today with normal bicarbonate 30, no anion gap and normal pH 7.389.  She is without DKA.  She is without HHS.  She has mild AKI creatinine 1.42 from baseline 0.9.  She was given IVF and IV insulin with  improvement.  I stressed the importance of her complying with her diabetes regimen as well as keeping herself hydrated and avoiding renal toxic medicines are all she will experience worsening kidney function.  I also discussed the importance of her following up with her PCP as soon as possible for repeat lab check to ensure improvement in renal function as well as sugars and to discuss current diabetes management.  She is in agreement with this plan.  She was discharged in good condition.  Strict return precautions discussed.  Amount and/or Complexity of Data Reviewed Labs: ordered.  Risk OTC drugs.    Final Clinical Impression(s) / ED Diagnoses Final diagnoses:  Hyperglycemia  AKI (acute kidney injury) [facility])    Rx / [place] Orders ED Discharge Orders     None         Elgie Congo, MD 02/18/23 2048

## 2023-02-18 NOTE — Telephone Encounter (Signed)
Pt was advised Kimberly Herman 

## 2023-02-18 NOTE — Discharge Instructions (Addendum)
You were seen for high blood sugars.  Thankfully you do not have concerning levels causing diabetic ketoacidosis or requiring admission at this time.  You were given IV fluids and IV insulin with improvement.  You need to drink plenty of fluids at home. Avoid any ibuprofen or aspirin (non-steroidals).  It is very important you restart taking your Basaglar make an appointment as soon as possible with your primary care doctor for follow-up to discuss your current blood sugar regimen and repeat bloodwork next week.  You may need to start short acting insulin or add on a new medication.  Come back if any severe pain, uncontrolled vomiting, confusion, inability to urinate, continued high sugars, or any other symptoms concerning to you.

## 2023-02-18 NOTE — ED Triage Notes (Signed)
Pt reports getting a call from her PCP telling her to come to ED immediately, blood sugar from lab work done Wed was 678, denies any unusual sxs, takes long insulin at night, reports non-compliance, has not taken in 2 weeks

## 2023-02-18 NOTE — ED Notes (Signed)
ED MD informed of CBG 352

## 2023-02-21 ENCOUNTER — Other Ambulatory Visit: Payer: Self-pay

## 2023-02-21 ENCOUNTER — Telehealth: Payer: Self-pay

## 2023-02-21 ENCOUNTER — Other Ambulatory Visit: Payer: 59 | Admitting: Pharmacist

## 2023-02-21 MED ORDER — NOVOLOG FLEXPEN 100 UNIT/ML ~~LOC~~ SOPN
10.0000 [IU] | PEN_INJECTOR | Freq: Three times a day (TID) | SUBCUTANEOUS | 11 refills | Status: DC
  Filled 2023-02-21 (×2): qty 9, 30d supply, fill #0
  Filled 2023-04-08 – 2023-04-22 (×2): qty 9, 30d supply, fill #1

## 2023-02-21 MED ORDER — INSULIN LISPRO (1 UNIT DIAL) 100 UNIT/ML (KWIKPEN)
10.0000 [IU] | PEN_INJECTOR | Freq: Three times a day (TID) | SUBCUTANEOUS | 11 refills | Status: DC
  Filled 2023-02-21: qty 9, 30d supply, fill #0

## 2023-02-21 MED ORDER — OZEMPIC (0.25 OR 0.5 MG/DOSE) 2 MG/3ML ~~LOC~~ SOPN
PEN_INJECTOR | SUBCUTANEOUS | 2 refills | Status: DC
  Filled 2023-02-21: qty 3, 42d supply, fill #0

## 2023-02-21 NOTE — Transitions of Care (Post Inpatient/ED Visit) (Signed)
   02/21/2023  Name: Kimberly Herman MRN: [id number] DOB: [birth year]  Today's TOC FU Call Status: Today's TOC FU Call Status:: Successful TOC FU Call Competed TOC FU Call Complete Date: 02/21/23  Transition Care Management Follow-up Telephone Call Date of Discharge: 02/18/23 Discharge Facility: [facility] Type of Discharge: Emergency Department Reason for ED Visit: Endocrine Endocrine Diagnosis: Uncontrolled Diabetes How have you been since you were released from the hospital?: Same Any questions or concerns?: No  Items Reviewed: Did you receive and understand the discharge instructions provided?: Yes Medications obtained and verified?: Yes (Medications Reviewed) Any new allergies since your discharge?: No Dietary orders reviewed?: NA Do you have support at home?: Yes People in Home: child(ren), dependent, spouse  Home Care and Equipment/Supplies: Friend Ordered?: NA Any new equipment or medical supplies ordered?: NA  Functional Questionnaire: Do you need assistance with bathing/showering or dressing?: No Do you need assistance with meal preparation?: No Do you need assistance with eating?: No Do you have difficulty maintaining continence: No Do you need assistance with getting out of bed/getting out of a chair/moving?: No Do you have difficulty managing or taking your medications?: No  Follow up appointments reviewed: PCP Follow-up appointment confirmed?: Yes Date of PCP follow-up appointment?: 02/28/23 Follow-up Provider: [facility] Follow-up appointment confirmed?: NA Do you need transportation to your follow-up appointment?: No Do you understand care options if your condition(s) worsen?: Yes-patient verbalized understanding    [place] RMA

## 2023-02-21 NOTE — Progress Notes (Signed)
Care Coordination Call  Patient presented to the ED last week as instructed for hyperglycemia. Upon ED documentation, she noted she stopped her [place] because it "was not working".   PA submitted for Ozempic last week, received fax today that it was approved. Discussed with PCP, script sent to Pharmacy. Unfortunately, patient has a high deductible insurance plan, copay will be $900 for Ozempic.   Contacted patient. She has been giving Basaglar 30 units daily for the past 3 days. Reports fastings remaing 200-300s. She has been drinking apple cider vinegar and doing intermittent fasting. We discussed that apple cider vinegar in high quantities can be hard on the kidneys. We also discussed that there is no long term data with positive associations with fasting in DM; I instead recommend regular meals with proteins, fibers to help regular glucose control. She verbalizes understanding.    Recommend to continue Basaglar 30 units, daily, add Novlog. Start with 8 units before largest meal, with option to increase moving forward. Patient verbalizes agreement and understanding. PCP is also in agreement. Order sent.   Catie Hedwig Morton, PharmD, [place], [facility] [phone]

## 2023-02-24 ENCOUNTER — Other Ambulatory Visit: Payer: 59 | Admitting: Pharmacist

## 2023-02-24 NOTE — Progress Notes (Signed)
02/24/2023 Name: Kimberly Herman MRN: [id number] DOB: 20-Jan-1966  Chief Complaint  Patient presents with   Medication Management   Diabetes    Kimberly Herman is a 57 y.o. year old female who presented for a telephone visit.   They were referred to the pharmacist by their PCP for assistance in managing diabetes.   Subjective:  Care Team: Primary Care Provider: Fenton Foy, NP ; Next Scheduled Visit: 02/28/23  Medication Access/Adherence  Current Pharmacy:  [facility] [id number] [address], [address] AT [facility] [address] [place] [address] Phone: [phone] Fax: [employer], [place] [address] Phone: [phone] Fax: [phone]   Patient reports affordability concerns with their medications: No  Patient reports access/transportation concerns to their pharmacy: No  Patient reports adherence concerns with their medications:  No     Diabetes:  Current medications: Basaglar 30 units daily, Humalog 8 units with her largest meal Medications tried previously: metformin - concern with renal function; GLP1 - high deductible insurance plan is preventing her from accessing GLP1  Current glucose readings: fastings: 260-290s  Is planning on starting a plan to do intermittent fasting until 11 am  Patient denies hypoglycemic s/sx including dizziness, shakiness, sweating. Patient denies hyperglycemic symptoms including polyuria, polydipsia, polyphagia, nocturia, neuropathy, blurred vision.    Objective:  Lab Results  Component Value Date   HGBA1C 12.7 (A) 02/16/2023    Lab Results  Component Value Date   CREATININE 1.42 (H) 02/18/2023   BUN 24 (H) 02/18/2023   NA 137 02/18/2023   K 4.0 02/18/2023   CL 94 (L) 02/18/2023   CO2 30 02/18/2023    Lab Results  Component Value Date   CHOL 188 10/01/2021   HDL 40 10/01/2021    LDLCALC 110 (H) 10/01/2021   TRIG 217 (H) 10/01/2021   CHOLHDL 4.7 (H) 10/01/2021    Medications Reviewed Today     Reviewed by Elyse Jarvis, RMA (Registered Medical Assistant) on 02/21/23 at 1235  Med List Status: <None>   Medication Order Taking? Sig Documenting Provider Last Dose Status Informant  atorvastatin (LIPITOR) 40 MG tablet [id number] Yes Take 1 tablet (40 mg total) by mouth daily. Fenton Foy, NP Taking Active   blood glucose meter kit and supplies KIT [id number] Yes Dispense based on patient and insurance preference. Use up to four times daily as directed. (FOR ICD-9 250.00, 250.01). Azzie Glatter, FNP Taking Active Self  Blood Pressure Monitoring (BLOOD PRESSURE CUFF) MISC [id number] Yes Check blood pressure once daily at the same time and record readings Scot Jun, NP Taking Active Self  candesartan (ATACAND) 32 MG tablet [id number] Yes Take 1 tablet (32 mg total) by mouth daily. Fenton Foy, NP Taking Active   chlorthalidone (HYGROTON) 25 MG tablet [id number] Yes Take 1 tablet (25 mg total) by mouth daily. Fenton Foy, NP Taking Active   fluconazole (DIFLUCAN) 150 MG tablet [id number] No Take 1 tablet (150 mg total) by mouth daily.  Patient not taking: Reported on 02/21/2023   Fenton Foy, NP Not Taking Active   glucose blood (TRUE METRIX BLOOD GLUCOSE TEST) test strip [id number] Yes TEST UP TO FOUR TIMES DAILY Scot Jun, NP Taking Active Self  hydrALAZINE (APRESOLINE) 25 MG tablet [id number] Yes Take 1 tablet (25 mg total) by mouth 2 (two) times daily. Lazaro Arms  S, NP Taking Active   insulin aspart (NOVOLOG FLEXPEN) 100 UNIT/ML FlexPen [id number] Yes Inject 10 Units into the skin 3 (three) times daily with meals. Fenton Foy, NP Taking Active   Insulin Glargine [facility] KWIKPEN) 100 UNIT/ML [id number] Yes Inject 24 Units into the skin daily. Fenton Foy, NP Taking Active   Insulin Pen Needle (PEN NEEDLES) 32G X 4 MM MISC  [id number] Yes Use to inject insulin daily Fenton Foy, NP Taking Active               Assessment/Plan:   Diabetes: - Currently uncontrolled but improving.  - Reviewed long term cardiovascular and renal outcomes of uncontrolled blood sugar - Reviewed goal A1c, goal fasting, and goal 2 hour post prandial glucose. Patient will check 2 hour post prandial readings and let us know via MyChart to allow for dose adjustments of Humalog. Patient will also notify of any hypoglycemic episodes   Follow Up Plan: PCP in 4 days  Catie Hedwig Morton, PharmD, [place], [facility] [phone]

## 2023-02-28 ENCOUNTER — Inpatient Hospital Stay: Payer: 59 | Admitting: Nurse Practitioner

## 2023-03-04 ENCOUNTER — Other Ambulatory Visit: Payer: Self-pay

## 2023-03-07 ENCOUNTER — Other Ambulatory Visit: Payer: Self-pay

## 2023-03-09 ENCOUNTER — Other Ambulatory Visit (HOSPITAL_BASED_OUTPATIENT_CLINIC_OR_DEPARTMENT_OTHER): Payer: Self-pay

## 2023-03-16 ENCOUNTER — Ambulatory Visit (INDEPENDENT_AMBULATORY_CARE_PROVIDER_SITE_OTHER): Payer: 59 | Admitting: Nurse Practitioner

## 2023-03-16 ENCOUNTER — Encounter: Payer: Self-pay | Admitting: Nurse Practitioner

## 2023-03-16 ENCOUNTER — Other Ambulatory Visit: Payer: Self-pay

## 2023-03-16 VITALS — BP 126/66 | HR 87 | Temp 97.3°F | Wt 192.0 lb

## 2023-03-16 DIAGNOSIS — I10 Essential (primary) hypertension: Secondary | ICD-10-CM

## 2023-03-16 DIAGNOSIS — E119 Type 2 diabetes mellitus without complications: Secondary | ICD-10-CM

## 2023-03-16 MED ORDER — CANDESARTAN CILEXETIL 32 MG PO TABS
32.0000 mg | ORAL_TABLET | Freq: Every day | ORAL | 2 refills | Status: DC
  Filled 2023-03-16 – 2023-04-08 (×4): qty 30, 30d supply, fill #0
  Filled 2023-05-13: qty 30, 30d supply, fill #1
  Filled 2023-06-09: qty 30, 30d supply, fill #2

## 2023-03-16 MED ORDER — ATORVASTATIN CALCIUM 40 MG PO TABS
40.0000 mg | ORAL_TABLET | Freq: Every day | ORAL | 2 refills | Status: DC
  Filled 2023-03-16: qty 30, 30d supply, fill #0

## 2023-03-16 MED ORDER — FREESTYLE LIBRE 3 SENSOR MISC
11 refills | Status: DC
  Filled 2023-03-16 – 2023-04-08 (×4): qty 2, 28d supply, fill #0
  Filled 2023-05-06: qty 2, 28d supply, fill #1
  Filled 2023-06-09: qty 2, 28d supply, fill #2
  Filled 2023-07-22: qty 2, 28d supply, fill #3
  Filled 2023-09-07: qty 2, 28d supply, fill #4
  Filled 2023-10-10 – 2023-10-26 (×5): qty 2, 28d supply, fill #5

## 2023-03-16 NOTE — Progress Notes (Signed)
@Patient  ID: Kimberly Herman, female    DOB: 1966/11/15, 57 y.o.   MRN: [id number]  Chief Complaint  Patient presents with   Hospitalization Follow-up    Referring provider: Ivonne Andrew, NP  HPI  Kimberly Herman 57 y.o. female  has a past medical history of Diabetes mellitus without complication (HCC), Hypertension, Menopause (2015), Vaginal polyp (10/2019), and Vitamin D deficiency (08/2019), status post left nephrectomy. To the [facility] for hospital follow up.    Patient presents today for follow-up on diabetes and hypertension. Patient states that she is compliant with her medications.  She is currently taking Basaglar 30 units and Humalog 10 units once daily.  She is followed by pharmacy for medication management.  Last A1C 3 months ago was 12.7.  We will place a referral to endocrinology for uncontrolled diabetes.  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  We will place order denies f/c/s, n/v/d, hemoptysis, PND, leg swelling Denies chest pain or edema.       Allergies  Allergen Reactions   Amlodipine Swelling   Bactrim [Sulfamethoxazole-Trimethoprim] Nausea Only    Extreme nausea and felt really bad   Carvedilol Diarrhea    Immunization History  Administered Date(s) Administered   PFIZER(Purple Top)SARS-COV-2 Vaccination 05/07/2020, 06/24/2020   PPD Test 07/09/2020    Past Medical History:  Diagnosis Date   Diabetes mellitus without complication (HCC)    Hypertension    Menopause 2015   Vaginal polyp 10/2019   Vitamin D deficiency 08/2019    Tobacco History: Social History   Tobacco Use  Smoking Status Never  Smokeless Tobacco Never   Counseling given: Not Answered   Outpatient Encounter Medications as of 03/16/2023  Medication Sig   blood glucose meter kit and supplies KIT Dispense based on patient and insurance preference. Use up to four times daily as directed. (FOR ICD-9 250.00, 250.01).    Blood Pressure Monitoring (BLOOD PRESSURE CUFF) MISC Check blood pressure once daily at the same time and record readings   chlorthalidone (HYGROTON) 25 MG tablet Take 1 tablet (25 mg total) by mouth daily.   Continuous Glucose Sensor (FREESTYLE LIBRE 3 SENSOR) MISC Place 1 sensor on the skin every 14 days. Use to check glucose continuously   glucose blood (TRUE METRIX BLOOD GLUCOSE TEST) test strip TEST UP TO FOUR TIMES DAILY   hydrALAZINE (APRESOLINE) 25 MG tablet Take 1 tablet (25 mg total) by mouth 2 (two) times daily.   insulin aspart (NOVOLOG FLEXPEN) 100 UNIT/ML FlexPen Inject 10 Units into the skin 3 (three) times daily with meals.   Insulin Glargine (BASAGLAR KWIKPEN) 100 UNIT/ML Inject 24 Units into the skin daily.   Insulin Pen Needle (PEN NEEDLES) 32G X 4 MM MISC Use to inject insulin daily   [DISCONTINUED] atorvastatin (LIPITOR) 40 MG tablet Take 1 tablet (40 mg total) by mouth daily.   [DISCONTINUED] candesartan (ATACAND) 32 MG tablet Take 1 tablet (32 mg total) by mouth daily.   atorvastatin (LIPITOR) 40 MG tablet Take 1 tablet (40 mg total) by mouth daily.   candesartan (ATACAND) 32 MG tablet Take 1 tablet (32 mg total) by mouth daily.   fluconazole (DIFLUCAN) 150 MG tablet Take 1 tablet (150 mg total) by mouth daily. (Patient not taking: Reported on 02/21/2023)   No facility-administered encounter medications on file as of 03/16/2023.     Review of Systems  Review of Systems  Constitutional: Negative.   HENT:  Negative.    Cardiovascular: Negative.   Gastrointestinal: Negative.   Allergic/Immunologic: Negative.   Neurological: Negative.   Psychiatric/Behavioral: Negative.         Physical Exam  BP 126/66   Pulse 87   Temp (!) 97.3 F (36.3 C)   Wt 192 lb (87.1 kg)   SpO2 99%   BMI 30.99 kg/m   Wt Readings from Last 5 Encounters:  03/16/23 192 lb (87.1 kg)  02/18/23 188 lb (85.3 kg)  02/16/23 184 lb (83.5 kg)  11/18/22 190 lb (86.2 kg)  08/12/22 180 lb 0.4  oz (81.7 kg)     Physical Exam Vitals and nursing note reviewed.  Constitutional:      General: She is not in acute distress.    Appearance: She is well-developed.  Cardiovascular:     Rate and Rhythm: Normal rate and regular rhythm.  Pulmonary:     Effort: Pulmonary effort is normal.     Breath sounds: Normal breath sounds.  Neurological:     Mental Status: She is alert and oriented to person, place, and time.      Lab Results:  CBC    Component Value Date/Time   WBC 7.0 02/18/2023 1412   RBC 4.48 02/18/2023 1412   HGB 13.6 02/18/2023 1447   HGB 12.7 02/16/2023 1539   HCT 40.0 02/18/2023 1447   HCT 38.4 02/16/2023 1539   PLT 193 02/18/2023 1412   PLT 160 02/16/2023 1539   MCV 85.3 02/18/2023 1412   MCV 90 02/16/2023 1539   MCH 30.1 02/18/2023 1412   MCHC 35.3 02/18/2023 1412   RDW 11.9 02/18/2023 1412   RDW 11.9 02/16/2023 1539   LYMPHSABS 1.5 08/12/2022 0451   LYMPHSABS 1.3 10/19/2021 1204   MONOABS 0.3 08/12/2022 0451   EOSABS 0.2 08/12/2022 0451   EOSABS 0.1 10/19/2021 1204   BASOSABS 0.1 08/12/2022 0451   BASOSABS 0.0 10/19/2021 1204    BMET    Component Value Date/Time   NA 137 02/18/2023 1447   NA 133 (L) 02/16/2023 1539   K 4.0 02/18/2023 1447   CL 94 (L) 02/18/2023 1412   CO2 30 02/18/2023 1412   GLUCOSE 427 (H) 02/18/2023 1412   BUN 24 (H) 02/18/2023 1412   BUN 19 02/16/2023 1539   CREATININE 1.42 (H) 02/18/2023 1412   CREATININE 1.05 (H) 03/24/2022 1601   CALCIUM 9.3 02/18/2023 1412   GFRNONAA 43 (L) 02/18/2023 1412   GFRNONAA 63 03/04/2017 1543   GFRAA 64 01/02/2021 1650   GFRAA 72 03/04/2017 1543    BNP No results found for: "BNP"  ProBNP No results found for: "PROBNP"  Imaging: No results found.   Assessment & Plan:   HTN (hypertension) - atorvastatin (LIPITOR) 40 MG tablet; Take 1 tablet (40 mg total) by mouth daily.  Dispense: 30 tablet; Refill: 2 - candesartan (ATACAND) 32 MG tablet; Take 1 tablet (32 mg total) by  mouth daily.  Dispense: 30 tablet; Refill: 2  2. Type 2 diabetes mellitus without complication, without long-term current use of insulin (HCC)  - atorvastatin (LIPITOR) 40 MG tablet; Take 1 tablet (40 mg total) by mouth daily.  Dispense: 30 tablet; Refill: 2  Follow up:  Follow up in 3 months     Ivonne Andrew, NP 04/20/2023

## 2023-03-22 ENCOUNTER — Other Ambulatory Visit: Payer: Self-pay

## 2023-03-25 ENCOUNTER — Other Ambulatory Visit: Payer: Self-pay

## 2023-03-28 ENCOUNTER — Other Ambulatory Visit: Payer: Self-pay | Admitting: Pharmacist

## 2023-03-28 ENCOUNTER — Telehealth: Payer: Self-pay | Admitting: Pharmacist

## 2023-03-28 NOTE — Progress Notes (Signed)
Attempted to contact patient for scheduled appointment for medication management. Left HIPAA compliant message for patient to return my call at their convenience.    Catie T. Zaylee Cornia, PharmD, BCACP, CPP [facility] [phone]  

## 2023-04-07 ENCOUNTER — Other Ambulatory Visit: Payer: Self-pay

## 2023-04-08 ENCOUNTER — Other Ambulatory Visit: Payer: Self-pay

## 2023-04-11 ENCOUNTER — Telehealth: Payer: Self-pay

## 2023-04-11 NOTE — Progress Notes (Signed)
   Care Guide Note  04/11/2023 Name: Kimberly Herman MRN: [id number] DOB: [birth year]  Referred by: Ivonne Andrew, NP Reason for referral : Care Coordination (Outreach to rs f/u with Pharm d )   Kimberly Herman is a 57 y.o. year old female who is a primary care patient of Ivonne Andrew, NP. Kimberly Herman was referred to the pharmacist for assistance related to DM.    Successful contact was made with the patient to discuss pharmacy services including being ready for the pharmacist to call at least 5 minutes before the scheduled appointment time, to have medication bottles and any blood sugar or blood pressure readings ready for review. The patient agreed to meet with the pharmacist via with the pharmacist via telephone visit on (date/time).  05/09/2023  Penne Lash, RMA Care Guide [facility]  [place], [place] [address] Direct Dial: [phone] [email or web address]

## 2023-04-20 ENCOUNTER — Encounter: Payer: Self-pay | Admitting: Nurse Practitioner

## 2023-04-20 NOTE — Patient Instructions (Signed)
1. Essential hypertension  - atorvastatin (LIPITOR) 40 MG tablet; Take 1 tablet (40 mg total) by mouth daily.  Dispense: 30 tablet; Refill: 2 - candesartan (ATACAND) 32 MG tablet; Take 1 tablet (32 mg total) by mouth daily.  Dispense: 30 tablet; Refill: 2  2. Type 2 diabetes mellitus without complication, without long-term current use of insulin (HCC)  - atorvastatin (LIPITOR) 40 MG tablet; Take 1 tablet (40 mg total) by mouth daily.  Dispense: 30 tablet; Refill: 2  Follow up:  Follow up in 3 months

## 2023-04-20 NOTE — Assessment & Plan Note (Signed)
-   atorvastatin (LIPITOR) 40 MG tablet; Take 1 tablet (40 mg total) by mouth daily.  Dispense: 30 tablet; Refill: 2 - candesartan (ATACAND) 32 MG tablet; Take 1 tablet (32 mg total) by mouth daily.  Dispense: 30 tablet; Refill: 2  2. Type 2 diabetes mellitus without complication, without long-term current use of insulin (HCC)  - atorvastatin (LIPITOR) 40 MG tablet; Take 1 tablet (40 mg total) by mouth daily.  Dispense: 30 tablet; Refill: 2  Follow up:  Follow up in 3 months

## 2023-04-22 ENCOUNTER — Other Ambulatory Visit: Payer: Self-pay

## 2023-05-03 ENCOUNTER — Encounter: Payer: Self-pay | Admitting: Pharmacist

## 2023-05-06 ENCOUNTER — Other Ambulatory Visit: Payer: Self-pay

## 2023-05-09 ENCOUNTER — Other Ambulatory Visit: Payer: Self-pay | Admitting: Pharmacist

## 2023-05-13 ENCOUNTER — Other Ambulatory Visit: Payer: Self-pay

## 2023-05-18 ENCOUNTER — Other Ambulatory Visit: Payer: Self-pay | Admitting: Pharmacist

## 2023-05-18 MED ORDER — NOVOLOG FLEXPEN 100 UNIT/ML ~~LOC~~ SOPN: PEN_INJECTOR | SUBCUTANEOUS | 11 refills | Status: DC

## 2023-05-18 MED ORDER — BASAGLAR KWIKPEN 100 UNIT/ML ~~LOC~~ SOPN: 24.0000 [IU] | PEN_INJECTOR | Freq: Every day | SUBCUTANEOUS | 3 refills | Status: DC

## 2023-05-18 NOTE — Patient Instructions (Addendum)
Edit,   It was great talking to you today!  I recommend you reduce Basaglar back to 24 units daily. Reduce Novolog to 16 units with breakfast, continue 20 units with supper.   Take care!  Catie Eppie Gibson, PharmD, BCACP, CPP Clinical Pharmacist [facility] [phone]

## 2023-05-18 NOTE — Progress Notes (Signed)
05/18/2023 Name: Kimberly Herman MRN: [id number] DOB: 1966/06/15  Chief Complaint  Patient presents with   Medication Management   Diabetes    ADARA KITTLE is a 57 y.o. year old female who presented for a telephone visit.   They were referred to the pharmacist by their PCP for assistance in managing diabetes.    Subjective:  Care Team: Primary Care Provider: Ivonne Andrew, NP ; Next Scheduled Visit: 05/23/23  Medication Access/Adherence  Current Pharmacy:  [facility] #[id number] [place], [place] - [address] AT [facility] [address] [facility] [address] [place] [address] Phone: [phone] Fax: [phone]  [facility] [address], [address] [place] [address] Phone: [phone] Fax: [phone]   Patient reports affordability concerns with their medications: No  Patient reports access/transportation concerns to their pharmacy: No  Patient reports adherence concerns with their medications:  No     Diabetes:  Current medications: Basaglar 30 units daily, Novolog 20 twice daily (~7-8 am; ~5:30-6 pm)  Medications tried in the past: metformin - patient has concerns regarding hx nephrology   Date of Download: 05/05/23-05/18/23 % Time CGM is active: 71% Average Glucose: 148 mg/dL Glucose Management Indicator: 6.9  Glucose Variability: 28.3 (goal <36%) Time in Goal:  - Time in range 70-180: 76% - Time above range: 23% - Time below range: 1%  Occasional hypoglycemia overnight  Patient reports hypoglycemic s/sx including dizziness, shakiness, sweating. Patient denies hyperglycemic symptoms including polyuria, polydipsia, polyphagia, nocturia,   Hypertension:  Current medications: candesartan 32 mg weekly, chlorthalidone 25 mg daily, hydralazine 25 mg twice daily   Patient has a validated, automated, upper arm home BP cuff Current blood pressure readings readings: not checking  Patient  denies hypotensive s/sx including dizziness, lightheadedness.  Patient denies hypertensive symptoms including headache, chest pain, shortness of breath  Hyperlipidemia/ASCVD Risk Reduction  Current lipid lowering medications: atorvastatin 40 mg daily   Objective:  Lab Results  Component Value Date   HGBA1C 12.7 (A) 02/16/2023    Lab Results  Component Value Date   CREATININE 1.42 (H) 02/18/2023   BUN 24 (H) 02/18/2023   NA 137 02/18/2023   K 4.0 02/18/2023   CL 94 (L) 02/18/2023   CO2 30 02/18/2023    Lab Results  Component Value Date   CHOL 188 10/01/2021   HDL 40 10/01/2021   LDLCALC 110 (H) 10/01/2021   TRIG 217 (H) 10/01/2021   CHOLHDL 4.7 (H) 10/01/2021    Medications Reviewed Today     Reviewed by Alden Hipp, RPH-CPP (Pharmacist) on 05/18/23 at 1341  Med List Status: <None>   Medication Order Taking? Sig Documenting Provider Last Dose Status Informant  atorvastatin (LIPITOR) 40 MG tablet [id number]  Take 1 tablet (40 mg total) by mouth daily. Ivonne Andrew, NP  Active   blood glucose meter kit and supplies KIT [id number]  Dispense based on patient and insurance preference. Use up to four times daily as directed. (FOR ICD-9 250.00, 250.01). Kallie Locks, FNP  Active Self  Blood Pressure Monitoring (BLOOD PRESSURE CUFF) MISC [id number]  Check blood pressure once daily at the same time and record readings Bing Neighbors, NP  Active Self  candesartan (ATACAND) 32 MG tablet [id number]  Take 1 tablet (32 mg total) by mouth daily. Ivonne Andrew, NP  Active   chlorthalidone (HYGROTON) 25 MG tablet [id number]  Take 1 tablet (25 mg total)  by mouth daily. Ivonne Andrew, NP  Active   Continuous Glucose Sensor (FREESTYLE LIBRE 3 SENSOR) [place] [id number] Yes Place 1 sensor on the skin every 14 days. Use to check glucose continuously Ivonne Andrew, NP Taking Active   fluconazole (DIFLUCAN) 150 MG tablet [id number]  Take 1 tablet (150 mg total) by mouth daily.   Patient not taking: Reported on 02/21/2023   Ivonne Andrew, NP  Active   glucose blood (TRUE METRIX BLOOD GLUCOSE TEST) test strip [id number]  TEST UP TO FOUR TIMES DAILY Bing Neighbors, NP  Active Self  hydrALAZINE (APRESOLINE) 25 MG tablet [id number]  Take 1 tablet (25 mg total) by mouth 2 (two) times daily. Ivonne Andrew, NP  Active   insulin aspart (NOVOLOG FLEXPEN) 100 UNIT/ML FlexPen [id number]  Inject 16 units with breakfast and 20 units with supper Ivonne Andrew, NP  Active   Insulin Glargine (BASAGLAR KWIKPEN) 100 UNIT/ML [id number]  Inject 24 Units into the skin daily. Ivonne Andrew, NP  Active   Insulin Pen Needle (PEN NEEDLES) 32G X 4 MM MISC [id number]  Use to inject insulin daily Ivonne Andrew, NP  Active               Assessment/Plan:   Diabetes: - Currently uncontrolled - Reviewed long term cardiovascular and renal outcomes of uncontrolled blood sugar - Reviewed goal A1c, goal fasting, and goal 2 hour post prandial glucose - Recommend to decrease Basaglar to 24 units daily, decrease breakfast dose to 16 units and continue 20 units with supper - Patient will follow up with Novo Nordisk to see what is needed from her regarding Rybels[employer] application. She will let me know.  - Recommend to check glucose using CGM  Hypertension: - Currently controlled - Reviewed long term cardiovascular and renal outcomes of uncontrolled blood pressure - Reviewed appropriate blood pressure monitoring technique and reviewed goal blood pressure. Recommended to check home blood pressure and heart rate periodically - Recommend to continue current regimen at this time  Hyperlipidemia/ASCVD Risk Reduction: - Currently due for recheck.  - Recommend to continue current regimen, check lipids with next appointment  Follow Up Plan: follow up in 8 weeks  Catie Eppie Gibson, PharmD, BCACP, CPP Clinical Pharmacist [facility] [phone]

## 2023-05-21 LAB — COLOGUARD: COLOGUARD: NEGATIVE

## 2023-05-23 ENCOUNTER — Ambulatory Visit: Payer: Self-pay | Admitting: Nurse Practitioner

## 2023-05-25 ENCOUNTER — Other Ambulatory Visit: Payer: Self-pay | Admitting: Nurse Practitioner

## 2023-05-25 ENCOUNTER — Other Ambulatory Visit (HOSPITAL_COMMUNITY): Payer: Self-pay

## 2023-05-25 ENCOUNTER — Other Ambulatory Visit: Payer: Self-pay

## 2023-05-25 ENCOUNTER — Telehealth: Payer: Self-pay | Admitting: Nurse Practitioner

## 2023-05-25 DIAGNOSIS — E119 Type 2 diabetes mellitus without complications: Secondary | ICD-10-CM

## 2023-05-25 MED ORDER — BASAGLAR KWIKPEN 100 UNIT/ML ~~LOC~~ SOPN
24.0000 [IU] | PEN_INJECTOR | Freq: Every day | SUBCUTANEOUS | 3 refills | Status: DC
  Filled 2023-05-27: qty 9, 37d supply, fill #0
  Filled 2023-08-23 (×2): qty 9, 37d supply, fill #1
  Filled 2023-10-10 – 2023-10-25 (×2): qty 9, 37d supply, fill #2
  Filled 2023-12-06: qty 9, 37d supply, fill #3
  Filled 2024-02-22: qty 9, 37d supply, fill #4
  Filled 2024-04-24: qty 9, 37d supply, fill #5

## 2023-05-25 MED ORDER — NOVOLOG FLEXPEN 100 UNIT/ML ~~LOC~~ SOPN
PEN_INJECTOR | SUBCUTANEOUS | 11 refills | Status: DC
  Filled 2023-05-27: qty 9, 25d supply, fill #0
  Filled 2023-07-22: qty 9, 25d supply, fill #1
  Filled 2023-08-23: qty 9, 25d supply, fill #2
  Filled 2023-10-10 – 2023-10-17 (×4): qty 9, 25d supply, fill #3
  Filled 2023-12-06 (×2): qty 9, 25d supply, fill #4

## 2023-05-25 NOTE — Telephone Encounter (Signed)
Done Kh 

## 2023-05-25 NOTE — Telephone Encounter (Signed)
Pt called requesting her insulin (novoalog and vasogaurd) and she is requesting to be able to pick it up today due to she is going out of town tomorrow.

## 2023-05-27 ENCOUNTER — Other Ambulatory Visit: Payer: Self-pay

## 2023-06-09 ENCOUNTER — Other Ambulatory Visit: Payer: Self-pay

## 2023-06-09 ENCOUNTER — Other Ambulatory Visit: Payer: Self-pay | Admitting: Nurse Practitioner

## 2023-06-09 DIAGNOSIS — I10 Essential (primary) hypertension: Secondary | ICD-10-CM

## 2023-06-10 ENCOUNTER — Other Ambulatory Visit: Payer: Self-pay

## 2023-06-10 MED ORDER — HYDRALAZINE HCL 25 MG PO TABS
25.0000 mg | ORAL_TABLET | Freq: Two times a day (BID) | ORAL | 2 refills | Status: DC
  Filled 2023-06-10: qty 60, 30d supply, fill #0
  Filled 2023-07-22: qty 60, 30d supply, fill #1
  Filled 2023-09-29 – 2023-10-11 (×2): qty 60, 30d supply, fill #2

## 2023-06-10 NOTE — Telephone Encounter (Signed)
Please advise Kh 

## 2023-06-15 ENCOUNTER — Ambulatory Visit (INDEPENDENT_AMBULATORY_CARE_PROVIDER_SITE_OTHER): Payer: 59 | Admitting: Nurse Practitioner

## 2023-06-15 ENCOUNTER — Other Ambulatory Visit (HOSPITAL_COMMUNITY)
Admission: RE | Admit: 2023-06-15 | Discharge: 2023-06-15 | Disposition: A | Discharge status: Home / Self Care | Payer: 59 | Source: Ambulatory Visit | Attending: Nurse Practitioner | Admitting: Nurse Practitioner

## 2023-06-15 ENCOUNTER — Encounter: Payer: Self-pay | Admitting: Nurse Practitioner

## 2023-06-15 VITALS — BP 130/66 | HR 79 | Temp 97.1°F | Ht 65.5 in | Wt 200.0 lb

## 2023-06-15 DIAGNOSIS — E119 Type 2 diabetes mellitus without complications: Secondary | ICD-10-CM

## 2023-06-15 DIAGNOSIS — Z124 Encounter for screening for malignant neoplasm of cervix: Secondary | ICD-10-CM | POA: Insufficient documentation

## 2023-06-15 LAB — POCT GLYCOSYLATED HEMOGLOBIN (HGB A1C): Hemoglobin A1C: 6.5 % — AB (ref 4.0–5.6)

## 2023-06-15 NOTE — Patient Instructions (Addendum)
1. Cervical cancer screening  - Cytology - PAP([place])    2. Type 2 diabetes mellitus without complication, without long-term current use of insulin (HCC)  - POCT glycosylated hemoglobin (Hb A1C)    Follow up:  Follow up in 3 months

## 2023-06-15 NOTE — Assessment & Plan Note (Signed)
-   Cytology - PAP([place])    2. Type 2 diabetes mellitus without complication, without long-term current use of insulin (HCC)  - POCT glycosylated hemoglobin (Hb A1C)    Follow up:  Follow up in 3 months

## 2023-06-15 NOTE — Progress Notes (Signed)
@Patient  ID: Kimberly Herman, female    DOB: 03/27/1966, 57 y.o.   MRN: [id number]  Chief Complaint  Patient presents with   Diabetes   Gynecologic Exam    Referring provider: Ivonne Andrew, NP   HPI  Kimberly Herman 57 y.o. female  has a past medical history of Diabetes mellitus without complication (HCC), Hypertension, Menopause (2015), Vaginal polyp (10/2019), and Vitamin D deficiency (08/2019), status post left nephrectomy. To the [facility] for hospital follow up.     Patient presents today for follow-up on diabetes and hypertension. Patient states that she is compliant with her medications.  She is currently taking Basaglar 24 units and Humalog.  She is followed by pharmacy for medication management.  Last A1C 3 months ago was 12.7.  A1C is 6.5. We will place a referral to endocrinology for uncontrolled diabetes.  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  We will place order denies f/c/s, n/v/d, hemoptysis, PND, leg swelling Denies chest pain or edema.      Allergies  Allergen Reactions   Amlodipine Swelling   Bactrim [Sulfamethoxazole-Trimethoprim] Nausea Only    Extreme nausea and felt really bad   Carvedilol Diarrhea    Immunization History  Administered Date(s) Administered   PFIZER(Purple Top)SARS-COV-2 Vaccination 05/07/2020, 06/24/2020   PPD Test 07/09/2020    Past Medical History:  Diagnosis Date   Diabetes mellitus without complication (HCC)    Hypertension    Menopause 2015   Vaginal polyp 10/2019   Vitamin D deficiency 08/2019    Tobacco History: Social History   Tobacco Use  Smoking Status Never  Smokeless Tobacco Never   Counseling given: Not Answered   Outpatient Encounter Medications as of 06/15/2023  Medication Sig   atorvastatin (LIPITOR) 40 MG tablet Take 1 tablet (40 mg total) by mouth daily.   blood glucose meter kit and supplies KIT Dispense based on patient and insurance  preference. Use up to four times daily as directed. (FOR ICD-9 250.00, 250.01).   Blood Pressure Monitoring (BLOOD PRESSURE CUFF) MISC Check blood pressure once daily at the same time and record readings   chlorthalidone (HYGROTON) 25 MG tablet Take 1 tablet (25 mg total) by mouth daily.   Continuous Glucose Sensor (FREESTYLE LIBRE 3 SENSOR) MISC Place 1 sensor on the skin every 14 days. Use to check glucose continuously   glucose blood (TRUE METRIX BLOOD GLUCOSE TEST) test strip TEST UP TO FOUR TIMES DAILY   hydrALAZINE (APRESOLINE) 25 MG tablet Take 1 tablet (25 mg total) by mouth 2 (two) times daily.   insulin aspart (NOVOLOG FLEXPEN) 100 UNIT/ML FlexPen Inject 16 units with breakfast and 20 units with supper   Insulin Glargine (BASAGLAR KWIKPEN) 100 UNIT/ML Inject 24 Units into the skin daily.   Insulin Pen Needle (PEN NEEDLES) 32G X 4 MM MISC Use to inject insulin daily   candesartan (ATACAND) 32 MG tablet Take 1 tablet (32 mg total) by mouth daily.   fluconazole (DIFLUCAN) 150 MG tablet Take 1 tablet (150 mg total) by mouth daily. (Patient not taking: Reported on 02/21/2023)   No facility-administered encounter medications on file as of 06/15/2023.     Review of Systems  Review of Systems  Constitutional: Negative.   HENT: Negative.    Cardiovascular: Negative.   Gastrointestinal: Negative.   Allergic/Immunologic: Negative.   Neurological: Negative.   Psychiatric/Behavioral: Negative.         Physical Exam  BP 130/66   Pulse 79   Temp (!) 97.1 F (36.2 C)   Ht 5' 5.5" (1.664 m)   Wt 200 lb (90.7 kg)   SpO2 97%   BMI 32.78 kg/m   Wt Readings from Last 5 Encounters:  06/15/23 200 lb (90.7 kg)  03/16/23 192 lb (87.1 kg)  02/18/23 188 lb (85.3 kg)  02/16/23 184 lb (83.5 kg)  11/18/22 190 lb (86.2 kg)     Physical Exam Vitals and nursing note reviewed.  Constitutional:      General: She is not in acute distress.    Appearance: She is well-developed.   Cardiovascular:     Rate and Rhythm: Normal rate and regular rhythm.  Pulmonary:     Effort: Pulmonary effort is normal.     Breath sounds: Normal breath sounds.  Neurological:     Mental Status: She is alert and oriented to person, place, and time.      Lab Results:  CBC    Component Value Date/Time   WBC 7.0 02/18/2023 1412   RBC 4.48 02/18/2023 1412   HGB 13.6 02/18/2023 1447   HGB 12.7 02/16/2023 1539   HCT 40.0 02/18/2023 1447   HCT 38.4 02/16/2023 1539   PLT 193 02/18/2023 1412   PLT 160 02/16/2023 1539   MCV 85.3 02/18/2023 1412   MCV 90 02/16/2023 1539   MCH 30.1 02/18/2023 1412   MCHC 35.3 02/18/2023 1412   RDW 11.9 02/18/2023 1412   RDW 11.9 02/16/2023 1539   LYMPHSABS 1.5 08/12/2022 0451   LYMPHSABS 1.3 10/19/2021 1204   MONOABS 0.3 08/12/2022 0451   EOSABS 0.2 08/12/2022 0451   EOSABS 0.1 10/19/2021 1204   BASOSABS 0.1 08/12/2022 0451   BASOSABS 0.0 10/19/2021 1204    BMET    Component Value Date/Time   NA 137 02/18/2023 1447   NA 133 (L) 02/16/2023 1539   K 4.0 02/18/2023 1447   CL 94 (L) 02/18/2023 1412   CO2 30 02/18/2023 1412   GLUCOSE 427 (H) 02/18/2023 1412   BUN 24 (H) 02/18/2023 1412   BUN 19 02/16/2023 1539   CREATININE 1.42 (H) 02/18/2023 1412   CREATININE 1.05 (H) 03/24/2022 1601   CALCIUM 9.3 02/18/2023 1412   GFRNONAA 43 (L) 02/18/2023 1412   GFRNONAA 63 03/04/2017 1543   GFRAA 64 01/02/2021 1650   GFRAA 72 03/04/2017 1543      Assessment & Plan:   Cervical cancer screening - Cytology - PAP([place])    2. Type 2 diabetes mellitus without complication, without long-term current use of insulin (HCC)  - POCT glycosylated hemoglobin (Hb A1C)    Follow up:  Follow up in 3 months     Ivonne Andrew, NP 06/15/2023

## 2023-06-16 LAB — CBC
Hematocrit: 37.6 % (ref 34.0–46.6)
Hemoglobin: 12.4 g/dL (ref 11.1–15.9)
MCH: 29.7 pg (ref 26.6–33.0)
MCHC: 33 g/dL (ref 31.5–35.7)
MCV: 90 fL (ref 79–97)
RDW: 12.1 % (ref 11.7–15.4)
WBC: 6.7 10*3/uL (ref 3.4–10.8)

## 2023-06-16 LAB — COMPREHENSIVE METABOLIC PANEL
ALT: 20 IU/L (ref 0–32)
AST: 22 IU/L (ref 0–40)
Albumin: 4.6 g/dL (ref 3.8–4.9)
Alkaline Phosphatase: 133 IU/L — ABNORMAL HIGH (ref 44–121)
BUN/Creatinine Ratio: 18 (ref 9–23)
BUN: 23 mg/dL (ref 6–24)
Bilirubin Total: 0.7 mg/dL (ref 0.0–1.2)
CO2: 26 mmol/L (ref 20–29)
Chloride: 105 mmol/L (ref 96–106)
Globulin, Total: 3.5 g/dL (ref 1.5–4.5)
Glucose: 107 mg/dL — ABNORMAL HIGH (ref 70–99)
Potassium: 3.8 mmol/L (ref 3.5–5.2)
Sodium: 149 mmol/L — ABNORMAL HIGH (ref 134–144)
Total Protein: 8.1 g/dL (ref 6.0–8.5)
eGFR: 48 mL/min/{1.73_m2} — ABNORMAL LOW (ref >59)

## 2023-06-28 ENCOUNTER — Ambulatory Visit
Admission: RE | Admit: 2023-06-28 | Discharge: 2023-06-28 | Disposition: A | Discharge status: Home / Self Care | Payer: 59 | Source: Ambulatory Visit | Attending: Nurse Practitioner | Admitting: Nurse Practitioner

## 2023-07-13 ENCOUNTER — Other Ambulatory Visit (HOSPITAL_BASED_OUTPATIENT_CLINIC_OR_DEPARTMENT_OTHER): Payer: Self-pay

## 2023-07-14 ENCOUNTER — Other Ambulatory Visit: Payer: Self-pay

## 2023-07-14 ENCOUNTER — Encounter: Payer: Self-pay | Admitting: Nurse Practitioner

## 2023-07-14 ENCOUNTER — Ambulatory Visit (INDEPENDENT_AMBULATORY_CARE_PROVIDER_SITE_OTHER): Payer: 59 | Admitting: Nurse Practitioner

## 2023-07-14 VITALS — BP 133/64 | HR 74 | Temp 97.1°F | Wt 207.0 lb

## 2023-07-14 DIAGNOSIS — E119 Type 2 diabetes mellitus without complications: Secondary | ICD-10-CM

## 2023-07-14 DIAGNOSIS — Z111 Encounter for screening for respiratory tuberculosis: Secondary | ICD-10-CM

## 2023-07-14 DIAGNOSIS — I10 Essential (primary) hypertension: Secondary | ICD-10-CM

## 2023-07-14 MED ORDER — CHLORTHALIDONE 25 MG PO TABS
25.0000 mg | ORAL_TABLET | Freq: Every day | ORAL | 5 refills | Status: DC
  Filled 2023-07-14 – 2023-07-22 (×2): qty 30, 30d supply, fill #0
  Filled 2023-09-07: qty 30, 30d supply, fill #1
  Filled 2023-10-25: qty 30, 30d supply, fill #2
  Filled 2023-12-06: qty 30, 30d supply, fill #3
  Filled 2024-01-20: qty 30, 30d supply, fill #4
  Filled 2024-02-22: qty 30, 30d supply, fill #5

## 2023-07-14 MED ORDER — CANDESARTAN CILEXETIL 32 MG PO TABS
32.0000 mg | ORAL_TABLET | Freq: Every day | ORAL | 2 refills | Status: DC
  Filled 2023-07-14 – 2023-07-22 (×2): qty 30, 30d supply, fill #0
  Filled 2023-09-07: qty 30, 30d supply, fill #1
  Filled 2023-10-25 (×2): qty 30, 30d supply, fill #2

## 2023-07-14 MED ORDER — ATORVASTATIN CALCIUM 40 MG PO TABS
40.0000 mg | ORAL_TABLET | Freq: Every day | ORAL | 2 refills | Status: DC
  Filled 2023-07-14 – 2023-07-22 (×2): qty 30, 30d supply, fill #0
  Filled 2023-10-25: qty 30, 30d supply, fill #1
  Filled 2023-12-06: qty 30, 30d supply, fill #2

## 2023-07-14 NOTE — Patient Instructions (Addendum)
1. Essential hypertension  - atorvastatin (LIPITOR) 40 MG tablet; Take 1 tablet (40 mg total) by mouth daily.  Dispense: 30 tablet; Refill: 2 - candesartan (ATACAND) 32 MG tablet; Take 1 tablet (32 mg total) by mouth daily.  Dispense: 30 tablet; Refill: 2 - chlorthalidone (HYGROTON) 25 MG tablet; Take 1 tablet (25 mg total) by mouth daily.  Dispense: 30 tablet; Refill: 5   2. Type 2 diabetes mellitus without complication, without long-term current use of insulin (HCC)  - atorvastatin (LIPITOR) 40 MG tablet; Take 1 tablet (40 mg total) by mouth daily.  Dispense: 30 tablet; Refill: 2   3. Screening-pulmonary TB  - QuantiFERON-TB Gold Plus   Follow up:  Follow up in 6 months

## 2023-07-14 NOTE — Progress Notes (Signed)
@Patient  ID: Kimberly Herman, female    DOB: June 11, 1966, 57 y.o.   MRN: [id number]  Chief Complaint  Patient presents with   Consult    Referring provider: Ivonne Andrew, NP   HPI  Kimberly Herman 57 y.o. female  has a past medical history of Diabetes mellitus without complication (HCC), Hypertension, Menopause (2015), Vaginal polyp (10/2019), and Vitamin D deficiency (08/2019), status post left nephrectomy. To the [facility] for hospital follow up.  Denies f/c/s, n/v/d, hemoptysis, PND, leg swelling. Denies chest pain or edema.  Patient presents today to have medical forms completed for a new job.  She does also need TB testing for this job.  We will order QuantiFERON gold.  We will complete paperwork in office today.  Overall patient has been doing well since her last visit here.  She does need refills today.  Denies f/c/s, n/v/d, hemoptysis, PND, leg swelling Denies chest pain or edema     Allergies  Allergen Reactions   Amlodipine Swelling   Bactrim [Sulfamethoxazole-Trimethoprim] Nausea Only    Extreme nausea and felt really bad   Carvedilol Diarrhea    Immunization History  Administered Date(s) Administered   PFIZER(Purple Top)SARS-COV-2 Vaccination 05/07/2020, 06/24/2020   PPD Test 07/09/2020    Past Medical History:  Diagnosis Date   Diabetes mellitus without complication (HCC)    Hypertension    Menopause 2015   Vaginal polyp 10/2019   Vitamin D deficiency 08/2019    Tobacco History: Social History   Tobacco Use  Smoking Status Never  Smokeless Tobacco Never   Counseling given: Not Answered   Outpatient Encounter Medications as of 07/14/2023  Medication Sig   blood glucose meter kit and supplies KIT Dispense based on patient and insurance preference. Use up to four times daily as directed. (FOR ICD-9 250.00, 250.01).   Blood Pressure Monitoring (BLOOD PRESSURE CUFF) MISC Check blood pressure once daily at the same time and record readings   Continuous  Glucose Sensor (FREESTYLE LIBRE 3 SENSOR) MISC Place 1 sensor on the skin every 14 days. Use to check glucose continuously   hydrALAZINE (APRESOLINE) 25 MG tablet Take 1 tablet (25 mg total) by mouth 2 (two) times daily.   insulin aspart (NOVOLOG FLEXPEN) 100 UNIT/ML FlexPen Inject 16 units with breakfast and 20 units with supper   Insulin Glargine (BASAGLAR KWIKPEN) 100 UNIT/ML Inject 24 Units into the skin daily.   Insulin Pen Needle (PEN NEEDLES) 32G X 4 MM MISC Use to inject insulin daily   [DISCONTINUED] atorvastatin (LIPITOR) 40 MG tablet Take 1 tablet (40 mg total) by mouth daily.   [DISCONTINUED] candesartan (ATACAND) 32 MG tablet Take 1 tablet (32 mg total) by mouth daily.   [DISCONTINUED] chlorthalidone (HYGROTON) 25 MG tablet Take 1 tablet (25 mg total) by mouth daily.   atorvastatin (LIPITOR) 40 MG tablet Take 1 tablet (40 mg total) by mouth daily.   candesartan (ATACAND) 32 MG tablet Take 1 tablet (32 mg total) by mouth daily.   chlorthalidone (HYGROTON) 25 MG tablet Take 1 tablet (25 mg total) by mouth daily.   fluconazole (DIFLUCAN) 150 MG tablet Take 1 tablet (150 mg total) by mouth daily. (Patient not taking: Reported on 02/21/2023)   glucose blood (TRUE METRIX BLOOD GLUCOSE TEST) test strip TEST UP TO FOUR TIMES DAILY   No facility-administered encounter medications on file as of 07/14/2023.     Review of Systems  Review of Systems  Constitutional: Negative.   HENT: Negative.  Cardiovascular: Negative.   Gastrointestinal: Negative.   Allergic/Immunologic: Negative.   Neurological: Negative.   Psychiatric/Behavioral: Negative.         Physical Exam  BP 133/64   Pulse 74   Temp (!) 97.1 F (36.2 C)   Wt 207 lb (93.9 kg)   SpO2 100%   BMI 33.92 kg/m   Wt Readings from Last 5 Encounters:  07/14/23 207 lb (93.9 kg)  06/15/23 200 lb (90.7 kg)  03/16/23 192 lb (87.1 kg)  02/18/23 188 lb (85.3 kg)  02/16/23 184 lb (83.5 kg)     Physical Exam Vitals and  nursing note reviewed.  Constitutional:      General: She is not in acute distress.    Appearance: She is well-developed.  Cardiovascular:     Rate and Rhythm: Normal rate and regular rhythm.  Pulmonary:     Effort: Pulmonary effort is normal.     Breath sounds: Normal breath sounds.  Neurological:     Mental Status: She is alert and oriented to person, place, and time.      Lab Results:  CBC    Component Value Date/Time   WBC 6.7 06/15/2023 1121   WBC 7.0 02/18/2023 1412   RBC 4.17 06/15/2023 1121   RBC 4.48 02/18/2023 1412   HGB 12.4 06/15/2023 1121   HCT 37.6 06/15/2023 1121   PLT 211 06/15/2023 1121   MCV 90 06/15/2023 1121   MCH 29.7 06/15/2023 1121   MCH 30.1 02/18/2023 1412   MCHC 33.0 06/15/2023 1121   MCHC 35.3 02/18/2023 1412   RDW 12.1 06/15/2023 1121   LYMPHSABS 1.5 08/12/2022 0451   LYMPHSABS 1.3 10/19/2021 1204   MONOABS 0.3 08/12/2022 0451   EOSABS 0.2 08/12/2022 0451   EOSABS 0.1 10/19/2021 1204   BASOSABS 0.1 08/12/2022 0451   BASOSABS 0.0 10/19/2021 1204    BMET    Component Value Date/Time   NA 149 (H) 06/15/2023 1121   K 3.8 06/15/2023 1121   CL 105 06/15/2023 1121   CO2 26 06/15/2023 1121   GLUCOSE 107 (H) 06/15/2023 1121   GLUCOSE 427 (H) 02/18/2023 1412   BUN 23 06/15/2023 1121   CREATININE 1.29 (H) 06/15/2023 1121   CREATININE 1.05 (H) 03/24/2022 1601   CALCIUM 9.8 06/15/2023 1121   GFRNONAA 43 (L) 02/18/2023 1412   GFRNONAA 63 03/04/2017 1543   GFRAA 64 01/02/2021 1650   GFRAA 72 03/04/2017 1543    BNP No results found for: "BNP"  ProBNP No results found for: "PROBNP"  Imaging: MM 3D SCREENING MAMMOGRAM BILATERAL BREAST  Result Date: 06/30/2023 CLINICAL DATA:  Screening. EXAM: DIGITAL SCREENING BILATERAL MAMMOGRAM WITH TOMOSYNTHESIS AND CAD TECHNIQUE: Bilateral screening digital craniocaudal and mediolateral oblique mammograms were obtained. Bilateral screening digital breast tomosynthesis was performed. The images were  evaluated with computer-aided detection. COMPARISON:  Previous exam(s). ACR Breast Density Category a: The breasts are almost entirely fatty. FINDINGS: There are no findings suspicious for malignancy. IMPRESSION: No mammographic evidence of malignancy. A result letter of this screening mammogram will be mailed directly to the patient. RECOMMENDATION: Screening mammogram in one year. (Code:[id number]) BI-RADS CATEGORY  1: Negative. Electronically Signed   By: Elberta Fortis M.D.   On: 06/30/2023 13:05     Assessment & Plan:   Essential hypertension - atorvastatin (LIPITOR) 40 MG tablet; Take 1 tablet (40 mg total) by mouth daily.  Dispense: 30 tablet; Refill: 2 - candesartan (ATACAND) 32 MG tablet; Take 1 tablet (32 mg total) by mouth daily.  Dispense: 30 tablet; Refill: 2 - chlorthalidone (HYGROTON) 25 MG tablet; Take 1 tablet (25 mg total) by mouth daily.  Dispense: 30 tablet; Refill: 5   2. Type 2 diabetes mellitus without complication, without long-term current use of insulin (HCC)  - atorvastatin (LIPITOR) 40 MG tablet; Take 1 tablet (40 mg total) by mouth daily.  Dispense: 30 tablet; Refill: 2   3. Screening-pulmonary TB  - QuantiFERON-TB Gold Plus   Follow up:  Follow up in 6 months     Ivonne Andrew, NP 07/14/2023

## 2023-07-14 NOTE — Assessment & Plan Note (Signed)
-   atorvastatin (LIPITOR) 40 MG tablet; Take 1 tablet (40 mg total) by mouth daily.  Dispense: 30 tablet; Refill: 2 - candesartan (ATACAND) 32 MG tablet; Take 1 tablet (32 mg total) by mouth daily.  Dispense: 30 tablet; Refill: 2 - chlorthalidone (HYGROTON) 25 MG tablet; Take 1 tablet (25 mg total) by mouth daily.  Dispense: 30 tablet; Refill: 5   2. Type 2 diabetes mellitus without complication, without long-term current use of insulin (HCC)  - atorvastatin (LIPITOR) 40 MG tablet; Take 1 tablet (40 mg total) by mouth daily.  Dispense: 30 tablet; Refill: 2   3. Screening-pulmonary TB  - QuantiFERON-TB Gold Plus   Follow up:  Follow up in 6 months

## 2023-07-17 LAB — QUANTIFERON-TB GOLD PLUS
QuantiFERON Mitogen Value: >10 [IU]/mL
QuantiFERON Nil Value: 0.06 [IU]/mL
QuantiFERON TB1 Ag Value: 0.05 [IU]/mL
QuantiFERON TB2 Ag Value: 0.05 [IU]/mL
QuantiFERON-TB Gold Plus: NEGATIVE

## 2023-07-21 ENCOUNTER — Other Ambulatory Visit: Payer: Self-pay

## 2023-07-22 ENCOUNTER — Other Ambulatory Visit: Payer: Self-pay

## 2023-07-26 ENCOUNTER — Other Ambulatory Visit: Payer: Self-pay

## 2023-07-29 ENCOUNTER — Other Ambulatory Visit: Payer: Self-pay

## 2023-08-23 ENCOUNTER — Other Ambulatory Visit: Payer: Self-pay

## 2023-08-29 ENCOUNTER — Other Ambulatory Visit: Payer: Self-pay

## 2023-09-07 ENCOUNTER — Other Ambulatory Visit: Payer: Self-pay

## 2023-09-15 ENCOUNTER — Ambulatory Visit: Payer: Self-pay | Admitting: Nurse Practitioner

## 2023-09-21 ENCOUNTER — Encounter: Payer: Self-pay | Admitting: Nurse Practitioner

## 2023-09-21 ENCOUNTER — Ambulatory Visit (INDEPENDENT_AMBULATORY_CARE_PROVIDER_SITE_OTHER): Payer: 59 | Admitting: Nurse Practitioner

## 2023-09-21 VITALS — BP 124/71 | HR 78 | Temp 98.0°F | Resp 14 | Ht 67.0 in | Wt 211.8 lb

## 2023-09-21 DIAGNOSIS — E119 Type 2 diabetes mellitus without complications: Secondary | ICD-10-CM | POA: Diagnosis not present

## 2023-09-21 LAB — POCT GLYCOSYLATED HEMOGLOBIN (HGB A1C): HbA1c, POC (controlled diabetic range): 7 % (ref 0.0–7.0)

## 2023-09-21 NOTE — Progress Notes (Signed)
Subjective   Patient ID: Kimberly Herman, female    DOB: 11-03-1966, 57 y.o.   MRN: [id number]  Chief Complaint  Patient presents with   Follow-up    Referring provider: Ivonne Andrew, NP  Kimberly Herman is a 57 y.o. female with Past Medical History: No date: Diabetes mellitus without complication (HCC) No date: Hypertension 2015: Menopause 10/2019: Vaginal polyp 08/2019: Vitamin D deficiency   HPI  Patient presents today for follow-up on diabetes and hypertension. Patient states that she is compliant with her medications.  She is currently taking Basaglar 24 units and Humalog.  She is followed by pharmacy for medication management.  Last A1C 3 months ago was 12.7. A1C is 7.0. We will place a referral to endocrinology for uncontrolled diabetes.  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  We will place order denies f/c/s, n/v/d, hemoptysis, PND, leg swelling. Denies chest pain or edema.    Allergies  Allergen Reactions   Amlodipine Swelling   Bactrim [Sulfamethoxazole-Trimethoprim] Nausea Only    Extreme nausea and felt really bad   Carvedilol Diarrhea    Immunization History  Administered Date(s) Administered   PFIZER(Purple Top)SARS-COV-2 Vaccination 05/07/2020, 06/24/2020   PPD Test 07/09/2020    Tobacco History: Social History   Tobacco Use  Smoking Status Never  Smokeless Tobacco Never   Counseling given: Not Answered   Outpatient Encounter Medications as of 09/21/2023  Medication Sig   atorvastatin (LIPITOR) 40 MG tablet Take 1 tablet (40 mg total) by mouth daily.   blood glucose meter kit and supplies KIT Dispense based on patient and insurance preference. Use up to four times daily as directed. (FOR ICD-9 250.00, 250.01).   Blood Pressure Monitoring (BLOOD PRESSURE CUFF) MISC Check blood pressure once daily at the same time and record readings   candesartan (ATACAND) 32 MG tablet Take 1 tablet  (32 mg total) by mouth daily.   chlorthalidone (HYGROTON) 25 MG tablet Take 1 tablet (25 mg total) by mouth daily.   Continuous Glucose Sensor (FREESTYLE LIBRE 3 SENSOR) MISC Place 1 sensor on the skin every 14 days. Use to check glucose continuously   glucose blood (TRUE METRIX BLOOD GLUCOSE TEST) test strip TEST UP TO FOUR TIMES DAILY   hydrALAZINE (APRESOLINE) 25 MG tablet Take 1 tablet (25 mg total) by mouth 2 (two) times daily.   insulin aspart (NOVOLOG FLEXPEN) 100 UNIT/ML FlexPen Inject 16 units with breakfast and 20 units with supper   Insulin Glargine (BASAGLAR KWIKPEN) 100 UNIT/ML Inject 24 Units into the skin daily.   Insulin Pen Needle (PEN NEEDLES) 32G X 4 MM MISC Use to inject insulin daily   fluconazole (DIFLUCAN) 150 MG tablet Take 1 tablet (150 mg total) by mouth daily. (Patient not taking: Reported on 09/21/2023)   No facility-administered encounter medications on file as of 09/21/2023.    Review of Systems  Review of Systems  Constitutional: Negative.   HENT: Negative.    Cardiovascular: Negative.   Gastrointestinal: Negative.   Allergic/Immunologic: Negative.   Neurological: Negative.   Psychiatric/Behavioral: Negative.       Objective:   BP 124/71 (BP Location: Left Arm, Patient Position: Sitting, Cuff Size: Normal)   Pulse 78   Temp 98 F (36.7 C)   Resp 14   Ht 5\' 7"  (1.702 m)   Wt 211 lb 12.8 oz (96.1 kg)   SpO2 98%   BMI 33.17 kg/m   Wt  Readings from Last 5 Encounters:  09/21/23 211 lb 12.8 oz (96.1 kg)  07/14/23 207 lb (93.9 kg)  06/15/23 200 lb (90.7 kg)  03/16/23 192 lb (87.1 kg)  02/18/23 188 lb (85.3 kg)     Physical Exam Vitals and nursing note reviewed.  Constitutional:      General: She is not in acute distress.    Appearance: She is well-developed.  Cardiovascular:     Rate and Rhythm: Normal rate and regular rhythm.  Pulmonary:     Effort: Pulmonary effort is normal.     Breath sounds: Normal breath sounds.  Neurological:      Mental Status: She is alert and oriented to person, place, and time.       Assessment & Plan:   Type 2 diabetes mellitus without complication, without long-term current use of insulin (HCC) -     POCT glycosylated hemoglobin (Hb A1C) -     CBC -     Comprehensive metabolic panel     Return in about 3 months (around 12/22/2023).   Ivonne Andrew, NP 09/21/2023

## 2023-09-21 NOTE — Patient Instructions (Addendum)
1. Type 2 diabetes mellitus without complication, without long-term current use of insulin (HCC)  - POCT glycosylated hemoglobin (Hb A1C) - CBC - Comprehensive metabolic panel   Follow up:  Follow up in 3 months

## 2023-10-05 ENCOUNTER — Other Ambulatory Visit: Payer: Self-pay

## 2023-10-10 ENCOUNTER — Other Ambulatory Visit: Payer: Self-pay

## 2023-10-10 ENCOUNTER — Other Ambulatory Visit (HOSPITAL_BASED_OUTPATIENT_CLINIC_OR_DEPARTMENT_OTHER): Payer: Self-pay

## 2023-10-11 ENCOUNTER — Other Ambulatory Visit (HOSPITAL_COMMUNITY): Payer: Self-pay

## 2023-10-11 ENCOUNTER — Other Ambulatory Visit: Payer: Self-pay

## 2023-10-11 ENCOUNTER — Telehealth: Payer: Self-pay | Admitting: Nurse Practitioner

## 2023-10-11 NOTE — Telephone Encounter (Signed)
Pt came in to give [place] her new medicaid info. She is requesting to be changed her Basagar to lancet due to insurance coverage,   Catie - she wanted to let you know she does have medicaid and she thinks she can take reblsis now so she doesn't have to take insulin   Tresa Endo - She needs prior [place] for Odin 3

## 2023-10-12 ENCOUNTER — Other Ambulatory Visit: Payer: Self-pay

## 2023-10-12 ENCOUNTER — Telehealth: Payer: Self-pay

## 2023-10-12 NOTE — Telephone Encounter (Signed)
Pharmacy Patient Advocate Encounter   Received notification from CoverMyMeds that prior authorization for FREESTYLE LIBRE 3 SENSOR is required/requested.   Insurance verification completed.   The patient is insured through [facility] .   Per test claim: PA required; PA submitted to above mentioned insurance via CoverMyMeds Key/confirmation #/EOC [id number] Status is pending

## 2023-10-14 ENCOUNTER — Other Ambulatory Visit: Payer: Self-pay

## 2023-10-17 ENCOUNTER — Other Ambulatory Visit: Payer: Self-pay

## 2023-10-19 ENCOUNTER — Telehealth: Payer: Self-pay | Admitting: Nurse Practitioner

## 2023-10-19 NOTE — Telephone Encounter (Signed)
Copied from CRM [id number]. Topic: Clinical - Medication Question >> Oct 19, 2023  9:19 AM Fuller Mandril wrote: Reason for CRM: Pt called back in to see if anything could be done to change her Rx since she brought in her new medicaid card. Per notes Family Planning and Jari Favre still active. She says Jari Favre will not be active much longer. Advised PA request for [place] received and will be submitted to [place] per notes. She wants to see if she is able to stop insulin and start rybelsus if covered by insurance

## 2023-10-24 ENCOUNTER — Other Ambulatory Visit: Payer: Self-pay

## 2023-10-25 ENCOUNTER — Other Ambulatory Visit: Payer: Self-pay

## 2023-10-26 ENCOUNTER — Other Ambulatory Visit: Payer: Self-pay | Admitting: Nurse Practitioner

## 2023-10-26 ENCOUNTER — Other Ambulatory Visit: Payer: Self-pay

## 2023-10-26 DIAGNOSIS — E119 Type 2 diabetes mellitus without complications: Secondary | ICD-10-CM

## 2023-10-26 DIAGNOSIS — I10 Essential (primary) hypertension: Secondary | ICD-10-CM

## 2023-10-26 MED ORDER — ONETOUCH ULTRASOFT LANCETS MISC
5 refills | Status: AC
  Filled 2023-10-26: qty 100, 50d supply, fill #0

## 2023-10-26 MED ORDER — HYDRALAZINE HCL 25 MG PO TABS
25.0000 mg | ORAL_TABLET | Freq: Two times a day (BID) | ORAL | 2 refills | Status: DC
  Filled 2023-10-26 – 2023-12-06 (×2): qty 60, 30d supply, fill #0
  Filled 2024-01-20: qty 60, 30d supply, fill #1
  Filled 2024-02-22: qty 60, 30d supply, fill #2

## 2023-10-26 MED ORDER — TRUE METRIX BLOOD GLUCOSE TEST VI STRP
ORAL_STRIP | 0 refills | Status: AC
  Filled 2023-10-26: qty 100, 50d supply, fill #0

## 2023-10-26 MED ORDER — TRUE METRIX AIR GLUCOSE METER W/DEVICE KIT
1.0000 [IU] | PACK | Freq: Two times a day (BID) | 0 refills | Status: AC
  Filled 2023-10-26: qty 1, 30d supply, fill #0

## 2023-10-26 NOTE — Telephone Encounter (Signed)
Please advise KH 

## 2023-10-27 ENCOUNTER — Other Ambulatory Visit: Payer: Self-pay

## 2023-10-31 ENCOUNTER — Other Ambulatory Visit: Payer: Self-pay

## 2023-12-06 ENCOUNTER — Other Ambulatory Visit: Payer: Self-pay

## 2023-12-06 ENCOUNTER — Other Ambulatory Visit: Payer: Self-pay | Admitting: Nurse Practitioner

## 2023-12-06 DIAGNOSIS — E119 Type 2 diabetes mellitus without complications: Secondary | ICD-10-CM

## 2023-12-06 DIAGNOSIS — I10 Essential (primary) hypertension: Secondary | ICD-10-CM

## 2023-12-06 MED ORDER — INSULIN LISPRO 200 UNIT/ML ~~LOC~~ SOPN
PEN_INJECTOR | SUBCUTANEOUS | 3 refills | Status: DC
  Filled 2023-12-06: qty 6, 30d supply, fill #0
  Filled 2023-12-06: qty 3, 17d supply, fill #0
  Filled 2024-02-22: qty 6, 30d supply, fill #1

## 2023-12-06 MED ORDER — PEN NEEDLES 32G X 4 MM MISC
3 refills | Status: DC
  Filled 2023-12-06: qty 100, 30d supply, fill #0
  Filled 2024-04-24: qty 100, 30d supply, fill #1

## 2023-12-06 MED ORDER — CANDESARTAN CILEXETIL 32 MG PO TABS
32.0000 mg | ORAL_TABLET | Freq: Every day | ORAL | 2 refills | Status: DC
  Filled 2023-12-06: qty 30, 30d supply, fill #0
  Filled 2024-01-20: qty 30, 30d supply, fill #1
  Filled 2024-02-22: qty 30, 30d supply, fill #2

## 2023-12-07 ENCOUNTER — Other Ambulatory Visit: Payer: Self-pay

## 2023-12-22 ENCOUNTER — Ambulatory Visit: Payer: No Typology Code available for payment source | Admitting: Nurse Practitioner

## 2023-12-22 ENCOUNTER — Other Ambulatory Visit: Payer: Self-pay

## 2023-12-22 ENCOUNTER — Encounter: Payer: Self-pay | Admitting: Nurse Practitioner

## 2023-12-22 VITALS — BP 132/77 | HR 72 | Temp 97.5°F | Wt 210.0 lb

## 2023-12-22 DIAGNOSIS — E119 Type 2 diabetes mellitus without complications: Secondary | ICD-10-CM

## 2023-12-22 DIAGNOSIS — Z905 Acquired absence of kidney: Secondary | ICD-10-CM

## 2023-12-22 DIAGNOSIS — Z13220 Encounter for screening for lipoid disorders: Secondary | ICD-10-CM

## 2023-12-22 LAB — POCT GLYCOSYLATED HEMOGLOBIN (HGB A1C): Hemoglobin A1C: 7.9 % — AB (ref 4.0–5.6)

## 2023-12-22 MED ORDER — FREESTYLE LIBRE 3 SENSOR MISC
11 refills | Status: DC
  Filled 2023-12-22: qty 1, 14d supply, fill #1
  Filled 2023-12-22: qty 1, 14d supply, fill #0
  Filled 2023-12-22: qty 2, 28d supply, fill #0
  Filled 2024-02-22: qty 1, 14d supply, fill #2

## 2023-12-22 NOTE — Progress Notes (Signed)
Subjective   Patient ID: Kimberly Herman, female    DOB: 1966/01/16, 58 y.o.   MRN: [id number]  Chief Complaint  Patient presents with   Diabetes    Follow up, patient stated that her numbers have been running high, this morning it was 210    Referring provider: Ivonne Andrew, NP  XYLIA SCHERGER is a 58 y.o. female with Past Medical History: No date: Diabetes mellitus without complication (HCC) No date: Hypertension 2015: Menopause 10/2019: Vaginal polyp 08/2019: Vitamin D deficiency   HPI  Patient presents today for follow-up on diabetes and hypertension. Patient states that she is compliant with her medications.  She is currently taking Basaglar 24 units and Humalog.  She is followed by pharmacy for medication management.  Last A1C 3 months ago was 7.0. A1C is 7.9 today. We will place a referral to endocrinology for uncontrolled diabetes.  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  We will place order denies f/c/s, n/v/d, hemoptysis, PND, leg swelling. Denies chest pain or edema.      Allergies  Allergen Reactions   Amlodipine Swelling   Bactrim [Sulfamethoxazole-Trimethoprim] Nausea Only    Extreme nausea and felt really bad   Carvedilol Diarrhea    Immunization History  Administered Date(s) Administered   PFIZER(Purple Top)SARS-COV-2 Vaccination 05/07/2020, 06/24/2020   PPD Test 07/09/2020    Tobacco History: Social History   Tobacco Use  Smoking Status Never  Smokeless Tobacco Never   Counseling given: Not Answered   Outpatient Encounter Medications as of 12/22/2023  Medication Sig   atorvastatin (LIPITOR) 40 MG tablet Take 1 tablet (40 mg total) by mouth daily.   blood glucose meter kit and supplies KIT Dispense based on patient and insurance preference. Use up to four times daily as directed. (FOR ICD-9 250.00, 250.01).   Blood Glucose Monitoring Suppl (TRUE METRIX AIR GLUCOSE METER) w/Device  KIT Test 2 (two) times daily.   candesartan (ATACAND) 32 MG tablet Take 1 tablet (32 mg total) by mouth daily.   chlorthalidone (HYGROTON) 25 MG tablet Take 1 tablet (25 mg total) by mouth daily.   glucose blood (TRUE METRIX BLOOD GLUCOSE TEST) test strip Use as instructed   hydrALAZINE (APRESOLINE) 25 MG tablet Take 1 tablet (25 mg total) by mouth 2 (two) times daily.   Insulin Glargine (BASAGLAR KWIKPEN) 100 UNIT/ML Inject 24 Units into the skin daily.   insulin lispro (HUMALOG) 200 UNIT/ML KwikPen Inject 16 Units into the skin daily with breakfast AND 20 Units daily with supper.   Insulin Pen Needle (PEN NEEDLES) 32G X 4 MM MISC Use to inject insulin daily   Lancets (ONETOUCH ULTRASOFT) lancets Use two times daily.   [DISCONTINUED] Continuous Glucose Sensor (FREESTYLE LIBRE 3 SENSOR) MISC Place 1 sensor on the skin every 14 days. Use to check glucose continuously   Blood Pressure Monitoring (BLOOD PRESSURE CUFF) MISC Check blood pressure once daily at the same time and record readings (Patient not taking: Reported on 12/22/2023)   Continuous Glucose Sensor (FREESTYLE LIBRE 3 SENSOR) MISC Place 1 sensor on the skin every 14 days. Use to check glucose continuously   fluconazole (DIFLUCAN) 150 MG tablet Take 1 tablet (150 mg total) by mouth daily. (Patient not taking: Reported on 12/22/2023)   No facility-administered encounter medications on file as of 12/22/2023.    Review of Systems  Review of Systems  Constitutional: Negative.   HENT: Negative.  Cardiovascular: Negative.   Gastrointestinal: Negative.   Allergic/Immunologic: Negative.   Neurological: Negative.   Psychiatric/Behavioral: Negative.       Objective:   BP 132/77   Pulse 72   Temp (!) 97.5 F (36.4 C)   Wt 210 lb (95.3 kg)   SpO2 100%   BMI 32.89 kg/m   Wt Readings from Last 5 Encounters:  12/22/23 210 lb (95.3 kg)  09/21/23 211 lb 12.8 oz (96.1 kg)  07/14/23 207 lb (93.9 kg)  06/15/23 200 lb (90.7 kg)   03/16/23 192 lb (87.1 kg)     Physical Exam Vitals and nursing note reviewed.  Constitutional:      General: She is not in acute distress.    Appearance: She is well-developed.  Cardiovascular:     Rate and Rhythm: Normal rate and regular rhythm.  Pulmonary:     Effort: Pulmonary effort is normal.     Breath sounds: Normal breath sounds.  Neurological:     Mental Status: She is alert and oriented to person, place, and time.       Assessment & Plan:   Type 2 diabetes mellitus without complication, without long-term current use of insulin (HCC) -     POCT glycosylated hemoglobin (Hb A1C) -     FreeStyle Libre 3 Sensor; Place 1 sensor on the skin every 14 days. Use to check glucose continuously  Dispense: 2 each; Refill: 11 -     Ambulatory referral to Endocrinology -     Ambulatory referral to Nephrology -     AMB Referral VBCI Care Management -     CBC -     Comprehensive metabolic panel  H/O left nephrectomy -     Ambulatory referral to Nephrology  Lipid screening -     Lipid panel     Return in about 3 months (around 03/21/2024).     Ivonne Andrew, NP 12/22/2023

## 2023-12-22 NOTE — Patient Instructions (Addendum)
1. Type 2 diabetes mellitus without complication, without long-term current use of insulin (HCC) (Primary)  - POCT glycosylated hemoglobin (Hb A1C) - Continuous Glucose Sensor (FREESTYLE LIBRE 3 SENSOR) MISC; Place 1 sensor on the skin every 14 days. Use to check glucose continuously  Dispense: 2 each; Refill: 11 - Ambulatory referral to Endocrinology - Ambulatory referral to Nephrology - AMB Referral VBCI Care Management - CBC - Comprehensive metabolic panel   2. H/O left nephrectomy  - Ambulatory referral to Nephrology   3. Lipid screening  - Lipid Panel   Follow up:  Follow up in 3 months

## 2023-12-23 LAB — CBC
Hematocrit: 39 % (ref 34.0–46.6)
Hemoglobin: 12.8 g/dL (ref 11.1–15.9)
MCH: 29.9 pg (ref 26.6–33.0)
MCHC: 32.8 g/dL (ref 31.5–35.7)
MCV: 91 fL (ref 79–97)
Platelets: 193 10*3/uL (ref 150–450)
RBC: 4.28 x10E6/uL (ref 3.77–5.28)
RDW: 12.1 % (ref 11.7–15.4)
WBC: 5.4 10*3/uL (ref 3.4–10.8)

## 2023-12-23 LAB — LIPID PANEL
Chol/HDL Ratio: 4.1 {ratio} (ref 0.0–4.4)
Cholesterol, Total: 199 mg/dL (ref 100–199)
HDL: 48 mg/dL (ref >39)
LDL Chol Calc (NIH): 122 mg/dL — ABNORMAL HIGH (ref 0–99)
Triglycerides: 161 mg/dL — ABNORMAL HIGH (ref 0–149)
VLDL Cholesterol Cal: 29 mg/dL (ref 5–40)

## 2023-12-23 LAB — COMPREHENSIVE METABOLIC PANEL
ALT: 24 [IU]/L (ref 0–32)
AST: 22 [IU]/L (ref 0–40)
Albumin: 4.3 g/dL (ref 3.8–4.9)
Alkaline Phosphatase: 132 [IU]/L — ABNORMAL HIGH (ref 44–121)
BUN/Creatinine Ratio: 12 (ref 9–23)
BUN: 18 mg/dL (ref 6–24)
Bilirubin Total: 0.7 mg/dL (ref 0.0–1.2)
CO2: 26 mmol/L (ref 20–29)
Calcium: 9.2 mg/dL (ref 8.7–10.2)
Chloride: 99 mmol/L (ref 96–106)
Creatinine, Ser: 1.5 mg/dL — ABNORMAL HIGH (ref 0.57–1.00)
Globulin, Total: 3 g/dL (ref 1.5–4.5)
Glucose: 220 mg/dL — ABNORMAL HIGH (ref 70–99)
Potassium: 3.7 mmol/L (ref 3.5–5.2)
Sodium: 140 mmol/L (ref 134–144)
Total Protein: 7.3 g/dL (ref 6.0–8.5)
eGFR: 40 mL/min/{1.73_m2} — ABNORMAL LOW (ref >59)

## 2024-01-10 ENCOUNTER — Telehealth: Payer: Self-pay

## 2024-01-10 DIAGNOSIS — Z79899 Other long term (current) drug therapy: Secondary | ICD-10-CM

## 2024-01-10 NOTE — Progress Notes (Signed)
   01/10/2024  Patient ID: Kimberly Herman, female   DOB: Jun 10, 1966, 58 y.o.   MRN: [id number]  Attempted to contact patient for medication management/review. Left HIPAA compliant message for patient to return my call at their convenience.   First attempt for patient outreach. Will follow up with patient in 5-7 business days.  Thank you for allowing pharmacy to be a part of this patient's care.  Cephus Shelling, PharmD Clinical Pharmacist Cell: [phone]

## 2024-01-17 ENCOUNTER — Telehealth: Payer: Self-pay

## 2024-01-17 DIAGNOSIS — Z79899 Other long term (current) drug therapy: Secondary | ICD-10-CM

## 2024-01-17 NOTE — Progress Notes (Signed)
   01/17/2024  Patient ID: Kimberly Herman, female   DOB: [birth year], 58 y.o.   MRN: [id number]  Attempted to contact patient for medication management/review. Left HIPAA compliant message for patient to return my call at their convenience.   Second attempt for patient outreach. Will follow up with patient in 5-7 business days.  Thank you for allowing pharmacy to be a part of this patient's care.  Cephus Shelling, PharmD Clinical Pharmacist Cell: [phone]

## 2024-01-20 ENCOUNTER — Other Ambulatory Visit: Payer: Self-pay

## 2024-01-26 ENCOUNTER — Telehealth: Payer: Self-pay

## 2024-01-26 NOTE — Progress Notes (Signed)
   01/26/2024  Patient ID: Kimberly Herman, female   DOB: July 22, 1966, 58 y.o.   MRN: [id number]    Outreach:  Unsuccessful telephone call attempt #4 to patient after I had a missed call from the patient returning my call.   HIPAA compliant voicemail left requesting a return call  Plan:  -I will close pharmacy referral at this time as I have been unable to establish and/or maintain contact with patient.  -I am happy to assist in the future as needed.       Thank you for allowing pharmacy to be a part of this patient's care.  Cephus Shelling, PharmD Clinical Pharmacist Cell: [phone]

## 2024-01-26 NOTE — Progress Notes (Signed)
   01/26/2024  Patient ID: Kimberly Herman, female   DOB: 1966-10-03, 58 y.o.   MRN: [id number]    Outreach:  Unsuccessful telephone call attempt #3 to patient.   HIPAA compliant voicemail left requesting a return call  Plan:  -I will close pharmacy referral at this time as I have been unable to establish and/or maintain contact with patient.  -I am happy to assist in the future as needed.       Thank you for allowing pharmacy to be a part of this patient's care.  Cephus Shelling, PharmD Clinical Pharmacist Cell: [phone]

## 2024-02-22 ENCOUNTER — Other Ambulatory Visit: Payer: Self-pay

## 2024-02-27 ENCOUNTER — Other Ambulatory Visit: Payer: Self-pay

## 2024-02-28 ENCOUNTER — Other Ambulatory Visit: Payer: Self-pay

## 2024-03-21 ENCOUNTER — Ambulatory Visit (INDEPENDENT_AMBULATORY_CARE_PROVIDER_SITE_OTHER): Payer: Self-pay | Admitting: Nurse Practitioner

## 2024-03-21 ENCOUNTER — Encounter: Payer: Self-pay | Admitting: Nurse Practitioner

## 2024-03-21 DIAGNOSIS — E119 Type 2 diabetes mellitus without complications: Secondary | ICD-10-CM | POA: Diagnosis not present

## 2024-03-21 LAB — POCT GLYCOSYLATED HEMOGLOBIN (HGB A1C): Hemoglobin A1C: 8.2 % — AB (ref 4.0–5.6)

## 2024-03-21 NOTE — Progress Notes (Signed)
 Subjective   Patient ID: Kimberly Herman, female    DOB: Jun 11, 1966, 59 y.o.   MRN: [id number]  Chief Complaint  Patient presents with   Follow-up         Referring provider: Jerrlyn Morel, NP  Kimberly Herman is a 58 y.o. female with Past Medical History: No date: Diabetes mellitus without complication (HCC) No date: Hypertension 2015: Menopause 10/2019: Vaginal polyp 08/2019: Vitamin D  deficiency   HPI  Patient presents today for follow-up on diabetes and hypertension. Patient states that she is compliant with her medications.  She is currently taking Basaglar  24 units and Humalog .  She is followed by pharmacy for medication management.  Last was 8.2 today. Referral placed to pharmacy for medication management.  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  We will place order denies f/c/s, n/v/d, hemoptysis, PND, leg swelling. Denies chest pain or edema.    Allergies  Allergen Reactions   Amlodipine  Swelling   Bactrim  [Sulfamethoxazole -Trimethoprim ] Nausea Only    Extreme nausea and felt really bad   Carvedilol  Diarrhea    Immunization History  Administered Date(s) Administered   PFIZER(Purple Top)SARS-COV-2 Vaccination 05/07/2020, 06/24/2020   PPD Test 07/09/2020    Tobacco History: Social History   Tobacco Use  Smoking Status Never  Smokeless Tobacco Never   Counseling given: Not Answered   Outpatient Encounter Medications as of 03/21/2024  Medication Sig   atorvastatin  (LIPITOR) 40 MG tablet Take 1 tablet (40 mg total) by mouth daily.   blood glucose meter kit and supplies KIT Dispense based on patient and insurance preference. Use up to four times daily as directed. (FOR ICD-9 250.00, 250.01).   Blood Glucose Monitoring Suppl (TRUE METRIX AIR GLUCOSE METER) w/Device KIT Test 2 (two) times daily.   candesartan  (ATACAND ) 32 MG tablet Take 1 tablet (32 mg total) by mouth daily.   chlorthalidone   (HYGROTON ) 25 MG tablet Take 1 tablet (25 mg total) by mouth daily.   Continuous Glucose Sensor (FREESTYLE LIBRE 3 SENSOR) MISC Place 1 sensor on the skin every 14 days. Use to check glucose continuously   glucose blood (TRUE METRIX BLOOD GLUCOSE TEST) test strip Use as instructed   hydrALAZINE  (APRESOLINE ) 25 MG tablet Take 1 tablet (25 mg total) by mouth 2 (two) times daily.   Insulin  Glargine (BASAGLAR  KWIKPEN) 100 UNIT/ML Inject 24 Units into the skin daily.   insulin  lispro (HUMALOG ) 200 UNIT/ML KwikPen Inject 16 Units into the skin daily with breakfast AND 20 Units daily with supper.   Insulin  Pen Needle (PEN NEEDLES) 32G X 4 MM MISC Use to inject insulin  daily   Lancets (ONETOUCH ULTRASOFT) lancets Use two times daily.   Blood Pressure Monitoring (BLOOD PRESSURE CUFF) MISC Check blood pressure once daily at the same time and record readings (Patient not taking: Reported on 12/22/2023)   fluconazole  (DIFLUCAN ) 150 MG tablet Take 1 tablet (150 mg total) by mouth daily. (Patient not taking: Reported on 12/22/2023)   No facility-administered encounter medications on file as of 03/21/2024.    Review of Systems  Review of Systems  Constitutional: Negative.   HENT: Negative.    Cardiovascular: Negative.   Gastrointestinal: Negative.   Allergic/Immunologic: Negative.   Neurological: Negative.   Psychiatric/Behavioral: Negative.       Objective:   BP 134/67   Pulse 80   Temp 98.2 F (36.8 C) (Oral)   Wt 206 lb (93.4 kg)   SpO2  99%   BMI 32.26 kg/m   Wt Readings from Last 5 Encounters:  03/21/24 206 lb (93.4 kg)  12/22/23 210 lb (95.3 kg)  09/21/23 211 lb 12.8 oz (96.1 kg)  07/14/23 207 lb (93.9 kg)  06/15/23 200 lb (90.7 kg)     Physical Exam Vitals and nursing note reviewed.  Constitutional:      General: She is not in acute distress.    Appearance: She is well-developed.  Cardiovascular:     Rate and Rhythm: Normal rate and regular rhythm.  Pulmonary:     Effort:  Pulmonary effort is normal.     Breath sounds: Normal breath sounds.  Neurological:     Mental Status: She is alert and oriented to person, place, and time.       Assessment & Plan:   Type 2 diabetes mellitus without complication, without long-term current use of insulin  (HCC) -     Microalbumin / creatinine urine ratio -     POCT glycosylated hemoglobin (Hb A1C) -     AMB Referral VBCI Care Management     Return in about 3 months (around 06/20/2024).   Jerrlyn Morel, NP 03/21/2024

## 2024-03-21 NOTE — Patient Instructions (Signed)
 1. Type 2 diabetes mellitus without complication, without long-term current use of insulin (HCC)  - Urine Albumin/Creatinine with ratio (send out) [LAB689] - POCT glycosylated hemoglobin (Hb A1C)

## 2024-03-22 ENCOUNTER — Telehealth: Payer: Self-pay | Admitting: *Deleted

## 2024-03-22 LAB — MICROALBUMIN / CREATININE URINE RATIO
Creatinine, Urine: 101.7 mg/dL
Microalb/Creat Ratio: 10 mg/g{creat} (ref 0–29)
Microalbumin, Urine: 10.1 ug/mL

## 2024-03-22 NOTE — Progress Notes (Unsigned)
 Care Guide Pharmacy Note  03/22/2024 Name: Kimberly Herman MRN: [id number] DOB: August 06, 1966  Referred By: Jerrlyn Morel, NP Reason for referral: No chief complaint on file.   Kimberly Herman is a 58 y.o. year old female who is a primary care patient of Jerrlyn Morel, NP.  Kimberly Herman was referred to the pharmacist for assistance related to: DMII  An unsuccessful telephone outreach was attempted today to contact the patient who was referred to the pharmacy team for assistance with medication management. Additional attempts will be made to contact the patient.  Kimberly Herman  [facility]  [facility], [facility] Guide  Direct Dial : [phone]  Fax [phone]

## 2024-03-26 NOTE — Progress Notes (Unsigned)
 Care Guide Pharmacy Note  03/26/2024 Name: Kimberly Herman MRN: [id number] DOB: Mar 03, 1966  Referred By: Jerrlyn Morel, NP Reason for referral: Complex Care Management (Initial outreach to schedule referral with PharmD Vallarie Gauze )   Kimberly Herman is a 58 y.o. year old female who is a primary care patient of Jerrlyn Morel, NP.  Kimberly Herman was referred to the pharmacist for assistance related to: DMII  A second unsuccessful telephone outreach was attempted today to contact the patient who was referred to the pharmacy team for assistance with medication assistance. Additional attempts will be made to contact the patient.  Kimberly Herman  [facility]  [facility], [facility] Guide  Direct Dial : [phone]  Fax [phone]

## 2024-03-28 NOTE — Progress Notes (Signed)
 Care Guide Pharmacy Note  03/28/2024 Name: Kimberly Herman MRN: [id number] DOB: 08-13-1966  Referred By: Jerrlyn Morel, NP Reason for referral: Complex Care Management (Initial outreach to schedule referral with PharmD Vallarie Gauze )   Kimberly Herman is a 59 y.o. year old female who is a primary care patient of Jerrlyn Morel, NP.  Kimberly Herman was referred to the pharmacist for assistance related to: DMII  Successful contact was made with the patient to discuss pharmacy services including being ready for the pharmacist to call at least 5 minutes before the scheduled appointment time and to have medication bottles and any blood pressure readings ready for review. The patient agreed to meet with the pharmacist via telephone visit on (date/time).6/3 at 9:00 AM  Barnie Bora  [facility], [facility] Guide  Direct Dial : [phone]  Fax [phone]

## 2024-04-09 ENCOUNTER — Other Ambulatory Visit (HOSPITAL_BASED_OUTPATIENT_CLINIC_OR_DEPARTMENT_OTHER): Payer: Self-pay

## 2024-04-14 ENCOUNTER — Other Ambulatory Visit (HOSPITAL_BASED_OUTPATIENT_CLINIC_OR_DEPARTMENT_OTHER): Payer: Self-pay

## 2024-04-24 ENCOUNTER — Other Ambulatory Visit: Payer: Self-pay

## 2024-04-24 ENCOUNTER — Other Ambulatory Visit: Payer: Self-pay | Admitting: Nurse Practitioner

## 2024-04-24 ENCOUNTER — Telehealth: Payer: Self-pay

## 2024-04-24 ENCOUNTER — Ambulatory Visit (INDEPENDENT_AMBULATORY_CARE_PROVIDER_SITE_OTHER): Payer: Self-pay

## 2024-04-24 ENCOUNTER — Other Ambulatory Visit (HOSPITAL_COMMUNITY): Payer: Self-pay

## 2024-04-24 DIAGNOSIS — E119 Type 2 diabetes mellitus without complications: Secondary | ICD-10-CM

## 2024-04-24 DIAGNOSIS — I10 Essential (primary) hypertension: Secondary | ICD-10-CM

## 2024-04-24 MED ORDER — HYDRALAZINE HCL 25 MG PO TABS
25.0000 mg | ORAL_TABLET | Freq: Two times a day (BID) | ORAL | 2 refills | Status: DC
  Filled 2024-04-24: qty 60, 30d supply, fill #0

## 2024-04-24 MED ORDER — FREESTYLE LIBRE 3 PLUS SENSOR MISC
11 refills | Status: DC
  Filled 2024-04-24: qty 2, 30d supply, fill #0
  Filled 2024-05-28: qty 2, 30d supply, fill #1

## 2024-04-24 MED ORDER — CANDESARTAN CILEXETIL 32 MG PO TABS
32.0000 mg | ORAL_TABLET | Freq: Every day | ORAL | 2 refills | Status: DC
  Filled 2024-04-24: qty 30, 30d supply, fill #0
  Filled 2024-06-08: qty 30, 30d supply, fill #1
  Filled 2024-08-01: qty 30, 30d supply, fill #2

## 2024-04-24 MED ORDER — CHLORTHALIDONE 25 MG PO TABS
25.0000 mg | ORAL_TABLET | Freq: Every day | ORAL | 5 refills | Status: DC
  Filled 2024-04-24: qty 30, 30d supply, fill #0
  Filled 2024-05-28: qty 30, 30d supply, fill #1
  Filled 2024-07-24: qty 30, 30d supply, fill #2
  Filled 2024-09-03: qty 30, 30d supply, fill #3
  Filled 2024-10-14: qty 30, 30d supply, fill #4
  Filled 2024-11-19: qty 30, 30d supply, fill #5

## 2024-04-24 MED ORDER — ATORVASTATIN CALCIUM 40 MG PO TABS
40.0000 mg | ORAL_TABLET | Freq: Every day | ORAL | 2 refills | Status: DC
  Filled 2024-04-24: qty 30, 30d supply, fill #0

## 2024-04-24 MED ORDER — HUMALOG KWIKPEN 200 UNIT/ML ~~LOC~~ SOPN
PEN_INJECTOR | SUBCUTANEOUS | 3 refills | Status: DC
  Filled 2024-04-24: qty 9, 50d supply, fill #0

## 2024-04-24 NOTE — Telephone Encounter (Signed)
 Patient previously unable to afford GLP-1RA due to high deductible plan. She has new insurance as of January 2025. Will request to resubmit PA for Ozempic , then Trulicity , if Ozempic  is not preferred.  Arthea Larsson, PharmD PGY1 Pharmacy Resident

## 2024-04-24 NOTE — Progress Notes (Unsigned)
 [employer]  Coupon Card ($10). Downloaded 04/24/24  BIN: [id number] PCN: [id number] GRP: [id number] ID: [id number]   Arthea Larsson, PharmD PGY1 Pharmacy Resident

## 2024-04-24 NOTE — Progress Notes (Unsigned)
 04/24/2024 Name: Kimberly Herman MRN: [id number] DOB: 1966/07/01  Chief Complaint  Patient presents with   Diabetes   Hypertension   Hyperlipidemia    Kimberly Herman is a 58 y.o. year old female who presented for a face-to-face visit.   They were referred to the pharmacist by their PCP for assistance in managing diabetes. PMH includes HTN, T2DM, HLD, IDA.  Subjective: Patient reports doing well today. She reports that BG have been running slightly higher over the past few months. She is unsure why as her diet has stayed consistent. She reports medication adherence, though her medication fill history suggests frequent missed doses.   Care Team: Primary Care Provider: Jerrlyn Morel, NP ; Next Scheduled Visit: 06/20/24 Endocrinologist Jorge Newcomer, MD; Next Scheduled Visit: 05/08/24  Medication Access/Adherence  Current Pharmacy:  [facility] - [facility] [address], [address] [place] [address] Phone: [phone] Fax: [phone]   Patient reports affordability concerns with their medications: Yes  - FL3+ CGM has been expensive for her. She usually paid $37.99 for one sensor to last 14 days, rather than $75 per mo Patient reports access/transportation concerns to their pharmacy: No  Patient reports adherence concerns with their medications:  Yes  - all meds last filled at [facility] 02/28/24, except atorvastatin  last filled 12/07/23. Reports forgetting medications 1-2 days per week.    Diabetes:  Current medications: Basaglar  (insulin  glargine) 24 units daily (thinks she is injecting 30 units daily), Humalog  (insulin  lispro) 16 units with breakfast and 20 units daily with supper (states she is doing 26 units with breakfast and dinner)  Medications tried in the past: glipizide , metformin  - renal fxn. GLP-1RAs were previously too expensive with high-deductible plan  Reports that she would like to try to obtain Rybelsus  since she has new  insurance. Discussed that Rybelsus  is less potent than once weekly injectable medications and has specific administration instructions, but she would still like to pursue Rybelsus  first.   Current glucose readings: last checked yesterday AM, BG 202 mg/dL Has not used Freestyle Libre 3 CGM since April (cost is expensive for her); testing 1-2 times daily  Patient denies hypoglycemic s/sx including dizziness, shakiness, sweating. Patient denies hyperglycemic symptoms including polyuria, polydipsia, polyphagia, nocturia, neuropathy, blurred vision.  Current meal patterns: Eats three meals a day: reports she is conscious of eating high protein diet, has sugar in moderation.  - Drinks: water, coffee with splenda, juice (thinks it is zero sugar), rarely soda, sweet tea  Current physical activity: no purposeful exercise right now, has membership to [employer]. She enjoys walking on the treadmil  Current medication access support: none  Hypertension:  Current medications: candesartan  32 mg daily, chlorthalidone  25 mg daily , hydralazine  25 mg BID Medications previously tried: carvedilol  - diarrhea, amlodipine  - LEE  She has taken her BP medications this AM  Patient has a validated, automated, upper arm home BP cuff Current blood pressure readings readings: not currently checking  Patient denies hypotensive s/sx including dizziness, lightheadedness.  Patient denies hypertensive symptoms including headache, chest pain, shortness of breath   Hyperlipidemia/ASCVD Risk Reduction  Current lipid lowering medications: atorvastatin  40 mg daily (not filled since 12/07/23 for 30ds) - reports that she takes atorvastatin  at night and sometimes forgets the dose.  Medications tried in the past:    Clinical ASCVD: No  The 10-year ASCVD risk score (Arnett DK, et al., 2019) is: 20.4%   Values used to calculate the score:  Age: 48 years     Sex: Female     Is Non-Hispanic African American: Yes      Diabetic: Yes     Tobacco smoker: No     Systolic Blood Pressure: 138 mmHg     Is BP treated: Yes     HDL Cholesterol: 48 mg/dL     Total Cholesterol: 199 mg/dL    Objective:  BP Readings from Last 3 Encounters:  04/24/24 138/84  03/21/24 134/67  12/22/23 132/77   Lab Results  Component Value Date   HGBA1C 8.2 (A) 03/21/2024   HGBA1C 7.9 (A) 12/22/2023   HGBA1C 7.0 09/21/2023    Lab Results  Component Value Date   CREATININE 1.50 (H) 12/22/2023   BUN 18 12/22/2023   NA 140 12/22/2023   K 3.7 12/22/2023   CL 99 12/22/2023   CO2 26 12/22/2023    Lab Results  Component Value Date   CHOL 199 12/22/2023   HDL 48 12/22/2023   LDLCALC 122 (H) 12/22/2023   TRIG 161 (H) 12/22/2023   CHOLHDL 4.1 12/22/2023    Medications Reviewed Today     Reviewed by Adra Alanis, RPH (Pharmacist) on 04/24/24 at 1224  Med List Status: <None>   Medication Order Taking? Sig Documenting Provider Last Dose Status Informant  atorvastatin  (LIPITOR) 40 MG tablet [id number] Yes Take 1 tablet (40 mg total) by mouth daily. Jerrlyn Morel, NP Taking Active            Med Note Adra Alanis   Tue Apr 24, 2024  9:54 AM) Reports occasional missed doses  blood glucose meter kit and supplies KIT [id number]  Dispense based on patient and insurance preference. Use up to four times daily as directed. (FOR ICD-9 250.00, 250.01). Stroud, Natalie M, FNP  Active Self  Blood Glucose Monitoring Suppl (TRUE METRIX AIR GLUCOSE METER) w/Device KIT [id number]  Test 2 (two) times daily. Jerrlyn Morel, NP  Active    Patient not taking:   Discontinued 04/24/24 0954 (No longer needed (for PRN medications)) candesartan  (ATACAND ) 32 MG tablet [id number] Yes Take 1 tablet (32 mg total) by mouth daily. Jerrlyn Morel, NP Taking Active   chlorthalidone  (HYGROTON ) 25 MG tablet [id number] Yes Take 1 tablet (25 mg total) by mouth daily. Jerrlyn Morel, NP Taking Active   Continuous Glucose Sensor (FREESTYLE LIBRE 3 PLUS  SENSOR) MISC [id number] Yes Change sensor every 15 days. Jerrlyn Morel, NP  Active   Patient not taking:  Discontinued 04/24/24 1223 (Change in therapy)   fluconazole  (DIFLUCAN ) 150 MG tablet [id number]  Take 1 tablet (150 mg total) by mouth daily. Jerrlyn Morel, NP  Active   glucose blood (TRUE METRIX BLOOD GLUCOSE TEST) test strip [id number]  Use as instructed Jerrlyn Morel, NP  Active   hydrALAZINE  (APRESOLINE ) 25 MG tablet [id number] Yes Take 1 tablet (25 mg total) by mouth 2 (two) times daily. Jerrlyn Morel, NP Taking Active   Insulin  Glargine (BASAGLAR  KWIKPEN) 100 UNIT/ML [id number] Yes Inject 24 Units into the skin daily. Jerrlyn Morel, NP Taking Active            Med Note Adra Alanis   Tue Apr 24, 2024  9:55 AM) Taking 30 units daily  insulin  lispro (HUMALOG ) 200 UNIT/ML KwikPen [id number] Yes Inject 16 Units into the skin daily with breakfast AND 20 Units daily with supper. Jerrlyn Morel, NP Taking Active  Med Note Francetta Innocent, Maretta Overdorf P   Tue Apr 24, 2024  9:55 AM) Taking 26 units with breakfast and dinner  Insulin  Pen Needle (PEN NEEDLES) 32G X 4 MM MISC [id number]  Use to inject insulin  daily Nichols, Tonya S, NP  Active   Lancets [facility] ULTRASOFT) lancets [id number]  Use two times daily. Jerrlyn Morel, NP  Active               Assessment/Plan:   Diabetes: - Currently uncontrolled with last A1C 8.2% above goal < 7%. Patient has self-increased insulin  doses. She is on more prandial than basal insulin , but is not having any hypoglycemic events. She is aware and able to verbalize hypoglycemia management plan. She is a good candidate for an injectable GLP-1RA with BMI> 30, CKD, and uncontrolled diabetes, however she prefers to attempt to obtain an oral GLP-1RA (Rybelsus ) at this time. Will coordinate with patient advocate team to submit PA and obtain estimated cost.   - Reviewed long term cardiovascular and renal outcomes of uncontrolled blood sugar - Reviewed  goal A1c, goal fasting, and goal 2 hour post prandial glucose - Reviewed dietary modifications including maximizing protein, limiting portion of carbohydrate food and drinks, drinking water throughout the day - Reviewed lifestyle modifications including: creating an exercise routine - walking or exercise classes - Recommend to continue Basaglar  30 units nightly and Humalog  26 units with breakfast and dinner. Will work with PCP to update prescriptions to reflect patient-reported dosing - Investigate coverage for Rybelsus . If approved and copay is affordable, recommend to start Rybelsus  (semaglutide ) 3 mg once daily in the morning. Educated patient on side effects and administration instructions. She verbalized understanding.   - Patient denies personal or family history of multiple endocrine neoplasia type 2, medullary thyroid  cancer; personal history of pancreatitis or gallbladder disease. - Recommend to check glucose continously using CGM if affordable, or fasting and before injecting insulin  with meals. Encouraged patient to have glucometer handy at appointments.  - Repeat A1C due 06/20/24   Hypertension: - Currently uncontrolled above goal < 130/80. Patient is not monitoring BP at home. Encouraged her to send a week's worth of readings via MyChart, so that we can adjust medication if needed. There is room to increase her hydralazine  to 50 mg two or three times a day, if needed.  - Reviewed long term cardiovascular and renal outcomes of uncontrolled blood pressure - Reviewed appropriate blood pressure monitoring technique and reviewed goal blood pressure. Recommended to check home blood pressure and heart rate once daily.  - Recommend to continue candesartan  32 mg daily, chlorthalidone  25 mg daily, and hydralazine  25 mg BID.     Hyperlipidemia/ASCVD Risk Reduction: - Currently uncontrolled with last LDL-C 122 mg/dL above goal < 70 mg/dL given W0JW and ASCVD 10 year risk of 18.9%. Last lipid panel  coincided with inconsistent adherence to statin. Emphasized importance of adherence today. Plan to repeat lipid panel in 6-12 weeks after consistently taking atorvastatin . Educated patient that she can take this in the morning if it is easier to remember.  - Reviewed long term complications of uncontrolled cholesterol - Recommend to continue atorvastatin  40 mg daily   Follow Up Plan: Endo 05/08/24, Pharmacist telephone 06/12/24, PCP 06/20/24  Arthea Larsson, PharmD PGY1 Pharmacy Resident

## 2024-04-24 NOTE — Patient Instructions (Addendum)
 It was great to see you today!  Please make sure you are taking your medications every day:  For diabetes: - Basaglar  30 units once daily - Humalog  26 units with breakfast and 26 units with dinner  For blood pressure:  - candesartan  32 mg daily - chlorthalidone  25 mg daily - hydralazine  25 mg twice daily  For cholesterol:  - atorvastatin  40 mg daily (you can take this at any time of the day)  Please start wearing your CGM again if you are able. If not, please check your blood sugar every day in the morning and before each meal that you inject insulin . Bring your glucometer to every visit.   Please check your blood pressure once daily for one week. Please send me a MyChart message with all of your readings. If they are consistently elevated above 130/80, we will need to adjust your medication.   Call [facility] to make nephrology appointment: [phone]. There is a referral in place.

## 2024-04-25 ENCOUNTER — Other Ambulatory Visit: Payer: Self-pay

## 2024-04-25 ENCOUNTER — Other Ambulatory Visit (HOSPITAL_COMMUNITY): Payer: Self-pay

## 2024-04-25 MED ORDER — HYDRALAZINE HCL 25 MG PO TABS
25.0000 mg | ORAL_TABLET | Freq: Two times a day (BID) | ORAL | 2 refills | Status: DC
  Filled 2024-04-25 – 2024-06-12 (×2): qty 60, 30d supply, fill #0
  Filled 2024-11-19: qty 60, 30d supply, fill #1

## 2024-04-25 MED ORDER — INSULIN LISPRO 200 UNIT/ML ~~LOC~~ SOPN
PEN_INJECTOR | SUBCUTANEOUS | 3 refills | Status: DC
  Filled 2024-04-25: qty 6, 29d supply, fill #0

## 2024-04-25 MED ORDER — RYBELSUS 7 MG PO TABS
7.0000 mg | ORAL_TABLET | Freq: Every day | ORAL | 3 refills | Status: DC
  Filled 2024-04-25: qty 30, 30d supply, fill #0

## 2024-04-25 MED ORDER — ATORVASTATIN CALCIUM 40 MG PO TABS
40.0000 mg | ORAL_TABLET | Freq: Every day | ORAL | 2 refills | Status: DC
  Filled 2024-04-25 – 2024-06-12 (×2): qty 30, 30d supply, fill #0
  Filled 2024-10-31: qty 30, 30d supply, fill #1

## 2024-04-25 MED ORDER — RYBELSUS 3 MG PO TABS
3.0000 mg | ORAL_TABLET | Freq: Every day | ORAL | 0 refills | Status: DC
  Filled 2024-04-25: qty 30, 30d supply, fill #0

## 2024-04-26 ENCOUNTER — Other Ambulatory Visit (HOSPITAL_BASED_OUTPATIENT_CLINIC_OR_DEPARTMENT_OTHER): Payer: Self-pay

## 2024-04-27 ENCOUNTER — Telehealth: Payer: Self-pay

## 2024-04-27 DIAGNOSIS — E119 Type 2 diabetes mellitus without complications: Secondary | ICD-10-CM

## 2024-04-27 NOTE — Telephone Encounter (Signed)
 Copied from CRM [id number]. Topic: General - Other >> Apr 26, 2024  4:53 PM Santiya F wrote: Reason for CRM: Patient is calling in requesting to speak with Layna.  Please advise. KH

## 2024-04-30 ENCOUNTER — Other Ambulatory Visit: Payer: Self-pay

## 2024-04-30 ENCOUNTER — Other Ambulatory Visit (HOSPITAL_COMMUNITY): Payer: Self-pay

## 2024-04-30 MED ORDER — RYBELSUS 3 MG PO TABS
3.0000 mg | ORAL_TABLET | Freq: Every day | ORAL | 0 refills | Status: DC
Start: 2024-04-30 — End: 2024-06-12
  Filled 2024-04-30 – 2024-05-28 (×2): qty 30, 30d supply, fill #0

## 2024-04-30 NOTE — Telephone Encounter (Signed)
 Returned call to patient. She reported that she has started Rybelsus  3 mg daily and is following the administration instructions. She reports that she has had some nausea since starting the medication. She asks if she is to continue her same insulin  doses.   Assessment/Plan: - Instructed patient to continue Rybelsus  3 mg daily - take 30 min before any other medications or food with 4 oz of water or less - Educated on methods to reduce nausea including staying hydrated with water and eating small protein snacks throughout the day - Instructed patient to continue current insulin  doses (Lantus  24 units daily, Humalog  U200 26 units with breakfast and dinner) - Continue monitoring BG continuously using CGM. She is connected to LibreView. Will set reminder to monitor BG remotely in ~1 week.   Follow up: Endo 05/08/24, Pharmacist telephone 06/12/24  Arthea Larsson, PharmD PGY1 Pharmacy Resident

## 2024-05-04 ENCOUNTER — Other Ambulatory Visit: Payer: Self-pay

## 2024-05-08 ENCOUNTER — Ambulatory Visit: Admitting: "Endocrinology

## 2024-05-10 ENCOUNTER — Telehealth: Payer: Self-pay

## 2024-05-10 ENCOUNTER — Other Ambulatory Visit: Payer: Self-pay

## 2024-05-10 MED ORDER — VITAMIN D (ERGOCALCIFEROL) 50000 UNITS PO CAPS
1.0000 | ORAL_CAPSULE | ORAL | 3 refills | Status: AC
  Filled 2024-05-10: qty 12, 84d supply, fill #0
  Filled 2024-09-03: qty 12, 84d supply, fill #1

## 2024-05-10 NOTE — Telephone Encounter (Signed)
 Attempted to outreach Ms. Hastings after starting Rybelsus . Reviewed LibreView to monitor progress.      Noted one episode of hypoglycemia. Once patient increases Rybelsus  to 7 mg daily, anticipate she may need a 20% reduction in dose of prandial insulin . Encouraged patient to give me a call back if she had any immediate questions or concerns. Congratulated on improvement.   Arthea Larsson, PharmD PGY1 Pharmacy Resident

## 2024-05-11 ENCOUNTER — Telehealth: Payer: Self-pay | Admitting: Nurse Practitioner

## 2024-05-11 NOTE — Telephone Encounter (Signed)
 Patient was identified as falling into the [place] Measure - Diabetes.   Patient was: Referred to pharmacy for chronic disease management.

## 2024-05-15 ENCOUNTER — Other Ambulatory Visit: Payer: Self-pay

## 2024-05-28 ENCOUNTER — Other Ambulatory Visit: Payer: Self-pay

## 2024-05-31 ENCOUNTER — Other Ambulatory Visit: Payer: Self-pay

## 2024-05-31 DIAGNOSIS — E119 Type 2 diabetes mellitus without complications: Secondary | ICD-10-CM

## 2024-05-31 MED ORDER — FREESTYLE LIBRE 3 PLUS SENSOR MISC
11 refills | Status: AC
  Filled 2024-05-31: qty 2, fill #0
  Filled 2024-06-28: qty 2, 30d supply, fill #0
  Filled 2024-07-24: qty 2, 30d supply, fill #1
  Filled 2024-09-03: qty 2, 30d supply, fill #2
  Filled 2024-10-31: qty 2, 30d supply, fill #3
  Filled 2024-11-29: qty 2, 30d supply, fill #4
  Filled 2024-12-26 (×2): qty 2, 30d supply, fill #0

## 2024-06-04 ENCOUNTER — Telehealth: Payer: Self-pay | Admitting: Nurse Practitioner

## 2024-06-04 NOTE — Telephone Encounter (Signed)
 Patient was identified as falling into the [place] Measure - Diabetes.   Patient was: Appointment already scheduled for:  06/12/24.

## 2024-06-08 ENCOUNTER — Other Ambulatory Visit: Payer: Self-pay

## 2024-06-12 ENCOUNTER — Other Ambulatory Visit: Payer: Self-pay

## 2024-06-12 ENCOUNTER — Other Ambulatory Visit (INDEPENDENT_AMBULATORY_CARE_PROVIDER_SITE_OTHER): Payer: Self-pay

## 2024-06-12 DIAGNOSIS — E119 Type 2 diabetes mellitus without complications: Secondary | ICD-10-CM

## 2024-06-12 MED ORDER — RYBELSUS 3 MG PO TABS
3.0000 mg | ORAL_TABLET | Freq: Every day | ORAL | 5 refills | Status: DC
  Filled 2024-06-12 – 2024-06-28 (×2): qty 30, 30d supply, fill #0
  Filled 2024-07-31: qty 30, 30d supply, fill #1
  Filled 2024-09-03: qty 30, 30d supply, fill #2
  Filled 2024-10-14: qty 30, 30d supply, fill #3
  Filled 2024-11-19: qty 30, 30d supply, fill #4

## 2024-06-12 NOTE — Progress Notes (Signed)
 06/12/2024 Name: Kimberly Herman MRN: [id number] DOB: [birth year]  Chief Complaint  Patient presents with   Diabetes   Hypertension   Hyperlipidemia    Kimberly Herman is a 58 y.o. year old female who presented for a telephone visit.   They were referred to the pharmacist by their PCP for assistance in managing diabetes. PMH includes HTN, T2DM, HLD, IDA.  Subjective: Patient was last engaged in-person by pharmacy on 04/24/24. At this visit, patient reported doing well. Her BG had been higher recently and her fill hx suggested missed doses of her medications. She was initiated on Rybelsus  3 mg daily. Patient was instructed to share in BP over MyChart to guide titration, but we did not receive updated home blood pressures. Between now and her last appt, her CGM has demonstrated excellent glycemic control. She did not attend her endocrinology appt on 05/08/24.   Today, she reports doing well. She states that she was sick to her stomach (nausea) after starting to Rybelsus  for a while (first month), but this has started to improve over the past month. Started to eat smaller portions.   Care Team: Primary Care Provider: Oley Bascom RAMAN, NP ; Next Scheduled Visit: 06/20/24 Endocrinologist Kimberly Birmingham, MD; Next Scheduled Visit: 05/08/24  Medication Access/Adherence  Current Pharmacy:  [facility] - [facility] [address], [address] [place] [address] Phone: [phone] Fax: [phone]   Patient reports affordability concerns with their medications: Yes  - FL3+ CGM has been expensive for her in the past. She usually paid $37.99 for one sensor to last 14 days, rather than $75 per mo. Feels that it is more doable, now that she is no longer paying for insulin .  Patient reports access/transportation concerns to their pharmacy: No  Patient reports adherence concerns with their medications:  Yes  - fill hx suggests frequently missed doses. She is currently  out of candesartan  and hydralazine  per patient report. Should also be out of atorvastatin  per fill hx.   Diabetes:  Current medications: Basaglar  (insulin  glargine) 24 units daily (not taking), Humalog  (insulin  lispro) 16 units with breakfast and 20 units daily with supper (not taking), Rybelsus  (semaglutide ) 3 mg daily (taking appropriately)  Reports that she hasn't taken any insulin  since starting Rybelsus .  Initially experienced nausea during the first month of taking Rybelsus  but this has recently improved.   Medications tried in the past: glipizide , metformin  - renal fxn. GLP-1RAs were previously too expensive with high-deductible plan  Current glucose readings:  Currently using FL3+ CGM   Date of Download: 06/12/24 % Time CGM is active: 94% Average Glucose: 129 mg/dL Glucose Management Indicator: 6.4  Glucose Variability: 20.6 (goal <36%) Time in Goal:  - Time in range 70-180: 95% - Time above range: 5% - Time below range: 0% Observed patterns:'     Patient denies hypoglycemic s/sx including dizziness, shakiness, sweating. Patient denies hyperglycemic symptoms including polyuria, polydipsia, polyphagia, nocturia, neuropathy, blurred vision.  Current meal patterns: Eats three meals a day: Reports that her appetite has decreased, but she is still able to eat 3 small meals throughout the day. She is conscious of eating high protein diet, has sugar in moderation. Reports that cravings for sugar have decreased - Drinks: water, coffee with splenda, juice (thinks it is zero sugar), rarely soda, sweet tea  Current physical activity: no purposeful exercise right now, has membership to [employer]. She enjoys walking on the treadmil  Current medication access support: none  Hypertension:  Current medications: candesartan  32 mg daily (needs to pick up from pharmacy), chlorthalidone  25 mg daily , hydralazine  25 mg BID (needs to pick up from pharmacy) Medications previously tried:  carvedilol  - diarrhea, amlodipine  - LEE  Patient has a validated, automated, upper arm home BP cuff Current blood pressure readings readings: not currently checking - reports that she will share readings via MyChart as requested.   Patient denies hypotensive s/sx including dizziness, lightheadedness.  Patient denies hypertensive symptoms including headache, chest pain, shortness of breath   Hyperlipidemia/ASCVD Risk Reduction  Current lipid lowering medications: atorvastatin  40 mg daily (reports that she is taking in the evenings) Medications tried in the past:    Clinical ASCVD: No  The 10-year ASCVD risk score (Arnett DK, et al., 2019) is: 20.4%   Values used to calculate the score:     Age: 5 years     Clincally relevant sex: Female     Is Non-Hispanic African American: Yes     Diabetic: Yes     Tobacco smoker: No     Systolic Blood Pressure: 138 mmHg     Is BP treated: Yes     HDL Cholesterol: 48 mg/dL     Total Cholesterol: 199 mg/dL    Objective:  BP Readings from Last 3 Encounters:  04/24/24 138/84  03/21/24 134/67  12/22/23 132/77   Lab Results  Component Value Date   HGBA1C 8.2 (A) 03/21/2024   HGBA1C 7.9 (A) 12/22/2023   HGBA1C 7.0 09/21/2023    Lab Results  Component Value Date   CREATININE 1.50 (H) 12/22/2023   BUN 18 12/22/2023   NA 140 12/22/2023   K 3.7 12/22/2023   CL 99 12/22/2023   CO2 26 12/22/2023    Lab Results  Component Value Date   CHOL 199 12/22/2023   HDL 48 12/22/2023   LDLCALC 122 (H) 12/22/2023   TRIG 161 (H) 12/22/2023   CHOLHDL 4.1 12/22/2023    Medications Reviewed Today     Reviewed by Kimberly Herman, RPH (Pharmacist) on 06/12/24 at [id number]  Med List Status: <None>   Medication Order Taking? Sig Documenting Provider Last Dose Status Informant  atorvastatin  (LIPITOR) 40 MG tablet [id number] Yes Take 1 tablet (40 mg total) by mouth daily. Kimberly Bascom RAMAN, NP  Active   blood glucose meter kit and supplies KIT [id number]   Dispense based on patient and insurance preference. Use up to four times daily as directed. (FOR ICD-9 250.00, 250.01). Stroud, Kimberly M, FNP  Active Self  Blood Glucose Monitoring Suppl (TRUE METRIX AIR GLUCOSE METER) w/Device KIT [id number]  Test 2 (two) times daily. Kimberly Bascom RAMAN, NP  Active   candesartan  (ATACAND ) 32 MG tablet [id number] Yes Take 1 tablet (32 mg total) by mouth daily. Kimberly Bascom RAMAN, NP  Active   chlorthalidone  (HYGROTON ) 25 MG tablet [id number] Yes Take 1 tablet (25 mg total) by mouth daily. Kimberly Bascom RAMAN, NP  Active   Continuous Glucose Sensor (FREESTYLE LIBRE 3 PLUS SENSOR) [place] [id number] Yes Change sensor every 15 days. Kimberly Bascom RAMAN, NP  Active     Discontinued 06/12/24 [phone] (No longer needed (for PRN medications))   glucose blood (TRUE METRIX BLOOD GLUCOSE TEST) test strip [id number]  Use as instructed Kimberly Bascom RAMAN, NP  Active   hydrALAZINE  (APRESOLINE ) 25 MG tablet [id number] Yes Take 1 tablet (25 mg total) by mouth 2 (two) times daily. Kimberly Bascom RAMAN, NP  Active     Discontinued 06/12/24 [phone] (  Change in therapy)            Med Note DELSA LORAIN Herman   Tue Apr 24, 2024  9:55 AM) Taking 30 units daily    Discontinued 06/12/24 0917 (Change in therapy)     Discontinued 06/12/24 0917 (Change in therapy)   Lancets (ONETOUCH ULTRASOFT) lancets [id number]  Use two times daily. Kimberly Bascom RAMAN, NP  Active   Semaglutide  (RYBELSUS ) 3 MG TABS [id number]  Take 1 tablet (3 mg total) by mouth daily. Take 30 minutes before any other food, drink, or medication. Kimberly Bascom RAMAN, NP  Active     Discontinued 06/12/24 [phone] (Dose change)   Vitamin D , Ergocalciferol , 50000 units CAPS [id number] Yes Take 1 capsule by mouth once a week.   Active               Assessment/Plan:   Diabetes: - Currently controlled with TIR of 95% above goal > 70% and GMI of 6.4%. Expect next A1C to be much improved from 8.2%. Patient reports that she has not taken insulin  since starting Rybelsus .  Appears she is responding very well to low-dose GLP-1RA. Suspect that improved diet is also contributing to current glycemic control. She continues to be a good candidate for GLP-1RA with BMI> 30, CKD. Agree with patient's self-discontinuation of basal and prandial insulin . Encouraged continued use of CGM, now that she is no longer paying for insulin .  - Reviewed long term cardiovascular and renal outcomes of uncontrolled blood sugar - Reviewed goal A1c, goal fasting, and goal 2 hour post prandial glucose - Reviewed dietary modifications including maximizing protein, limiting portion of carbohydrate food and drinks, drinking water throughout the day - Reviewed lifestyle modifications including: creating an exercise routine - walking or exercise classes - Recommend to STOP Basaglar  (insulin  glargine) and Humalog  (insulin  lispro).  - Recommend to continue Rybelsus  (semaglutide ) 3 mg PO once daily - Patient denies personal or family history of multiple endocrine neoplasia type 2, medullary thyroid  cancer; personal history of pancreatitis or gallbladder disease. - Recommend to check glucose continously using FL3+ CGM.  - Repeat A1C due 06/20/24   Hypertension: - Currently slightly uncontrolled above goal < 130/80 per recent clinic BP. Patient is not monitoring BP at home. Asked her again to send a week's worth of readings via MyChart, so that we can adjust medication if needed. There is room to increase her hydralazine  to 50 mg two or three times a day, if needed. Suspect adherence issues are main contributor to lack of control.  - Reviewed long term cardiovascular and renal outcomes of uncontrolled blood pressure - Reviewed appropriate blood pressure monitoring technique and reviewed goal blood pressure. Recommended to check home blood pressure and heart rate once daily.  - Recommend to continue candesartan  32 mg daily, chlorthalidone  25 mg daily, and hydralazine  25 mg BID.  - Requested fills from [facility]  pharmacy today.     Hyperlipidemia/ASCVD Risk Reduction: - Currently uncontrolled with last LDL-C 122 mg/dL above goal < 70 mg/dL given U7IF and ASCVD 10 year risk of 18.9%. Last lipid panel coincided with inconsistent adherence to statin. Re-emphasized importance of adherence today. Plan to repeat lipid panel in 6-12 weeks after consistently taking atorvastatin .  - Reviewed long term complications of uncontrolled cholesterol - Recommend to continue atorvastatin  40 mg daily. Requested fill from [facility] pharmacy today.   Follow Up Plan: PharmD telephone 07/17/24, PCP 06/20/24  LORAIN Baseman, PharmD [facility] [phone]'

## 2024-06-13 ENCOUNTER — Other Ambulatory Visit: Payer: Self-pay

## 2024-06-20 ENCOUNTER — Ambulatory Visit: Payer: Self-pay | Admitting: Nurse Practitioner

## 2024-06-28 ENCOUNTER — Other Ambulatory Visit: Payer: Self-pay

## 2024-07-17 ENCOUNTER — Telehealth: Payer: Self-pay

## 2024-07-17 ENCOUNTER — Other Ambulatory Visit: Payer: Self-pay

## 2024-07-17 NOTE — Progress Notes (Deleted)
 07/17/2024 Name: Kimberly Herman MRN: [id number] DOB: 1965-12-15  No chief complaint on file.   Kimberly Herman is a 58 y.o. year old female who presented for a telephone visit.   They were referred to the pharmacist by their PCP for assistance in managing diabetes. PMH includes HTN, T2DM, HLD, IDA.  Subjective: Patient was engaged in-person by pharmacy on 04/24/24. At this visit, patient reported doing well. Her BG had been higher recently and her fill hx suggested missed doses of her medications. She was initiated on Rybelsus  3 mg daily. Patient was instructed to share in BP over MyChart to guide titration, but we did not receive updated home blood pressures. At her last pharmacy telephone appointment, patient reported doing well on Rybelsus  3 mg daily. She had an initial period of nausea which began to resolve after the first month. Since starting Ryeblsus, she reported that she stopped taking insulin . Her glycemic control was excellent, with TIR of 94% on her last CGM report.   Today, she reports doing well. ***  Care Team: Primary Care Provider: Oley Bascom RAMAN, NP ; Next Scheduled Visit: 06/20/24 Endocrinologist Obadiah Birmingham, MD; Next Scheduled Visit: 05/08/24  Medication Access/Adherence  Current Pharmacy:  [facility] [address], [address] [place] [address] Phone: [phone] Fax: [phone]   Patient reports affordability concerns with their medications: Yes  - FL3+ CGM has been expensive for her in the past. She usually paid $37.99 for one sensor to last 14 days, rather than $75 per mo. Feels that it is more doable, now that she is no longer paying for insulin .   Patient reports access/transportation concerns to their pharmacy: No   Patient reports adherence concerns with their medications:  Yes  - fill hx suggests frequently missed doses of HTN medications.   Diabetes:  Current medications: Rybelsus  (semaglutide )  3 mg daily (taking appropriately)  Initially experienced nausea during the first month of taking Rybelsus  but this has recently improved. ***  Medications tried in the past: glipizide , metformin  - renal fxn. GLP-1RAs were previously too expensive with high-deductible plan  Current glucose readings:  Currently using FL3+ CGM   Date of Download: 07/17/24 % Time CGM is active: 82% Average Glucose: 141 mg/dL Glucose Management Indicator: 6.7 Glucose Variability: 21.8 (goal <36%) Time in Goal:  - Time in range 70-180: 90% - Time above range: 10% - Time below range: 0% Observed patterns:'     Patient denies hypoglycemic s/sx including dizziness, shakiness, sweating. Patient denies hyperglycemic symptoms including polyuria, polydipsia, polyphagia, nocturia, neuropathy, blurred vision.  Current meal patterns: Eats three meals a day: Reports that her appetite has decreased, but she is still able to eat 3 small meals throughout the day. She is conscious of eating high protein diet, has sugar in moderation. Reports that cravings for sugar have decreased - Drinks: water, coffee with splenda, juice (thinks it is zero sugar), rarely soda, sweet tea  Current physical activity: no purposeful exercise right now, has membership to [employer]. She enjoys walking on the treadmil  Current medication access support: none  Hypertension:  Current medications: candesartan  32 mg daily (needs to pick up from pharmacy), chlorthalidone  25 mg daily, hydralazine  25 mg BID (needs to pick up from pharmacy) Medications previously tried: carvedilol  - diarrhea, amlodipine  - LEE  Patient has a validated, automated, upper arm home BP cuff Current blood pressure readings readings: not currently checking - reports that she will share readings via  MyChart as requested.   Patient denies hypotensive s/sx including dizziness, lightheadedness.  Patient denies hypertensive symptoms including headache, chest pain,  shortness of breath   Hyperlipidemia/ASCVD Risk Reduction  Current lipid lowering medications: atorvastatin  40 mg daily (reports that she is taking in the evenings) Medications tried in the past:    Clinical ASCVD: No  The 10-year ASCVD risk score (Arnett DK, et al., 2019) is: 20.4%   Values used to calculate the score:     Age: 68 years     Clincally relevant sex: Female     Is Non-Hispanic African American: Yes     Diabetic: Yes     Tobacco smoker: No     Systolic Blood Pressure: 138 mmHg     Is BP treated: Yes     HDL Cholesterol: 48 mg/dL     Total Cholesterol: 199 mg/dL    Objective:  BP Readings from Last 3 Encounters:  04/24/24 138/84  03/21/24 134/67  12/22/23 132/77   Lab Results  Component Value Date   HGBA1C 8.2 (A) 03/21/2024   HGBA1C 7.9 (A) 12/22/2023   HGBA1C 7.0 09/21/2023    Lab Results  Component Value Date   CREATININE 1.50 (H) 12/22/2023   BUN 18 12/22/2023   NA 140 12/22/2023   K 3.7 12/22/2023   CL 99 12/22/2023   CO2 26 12/22/2023    Lab Results  Component Value Date   CHOL 199 12/22/2023   HDL 48 12/22/2023   LDLCALC 122 (H) 12/22/2023   TRIG 161 (H) 12/22/2023   CHOLHDL 4.1 12/22/2023    Medications Reviewed Today   Medications were not reviewed in this encounter       Assessment/Plan:   Diabetes: - Currently controlled with TIR of 95% above goal > 70% and GMI of 6.4%. Expect next A1C to be much improved from 8.2%. Patient reports that she has not taken insulin  since starting Rybelsus . Appears she is responding very well to low-dose GLP-1RA. Suspect that improved diet is also contributing to current glycemic control. She continues to be a good candidate for GLP-1RA with BMI> 30, CKD. Agree with patient's self-discontinuation of basal and prandial insulin . Encouraged continued use of CGM, now that she is no longer paying for insulin .  - Reviewed long term cardiovascular and renal outcomes of uncontrolled blood sugar -  Reviewed goal A1c, goal fasting, and goal 2 hour post prandial glucose - Reviewed dietary modifications including maximizing protein, limiting portion of carbohydrate food and drinks, drinking water throughout the day - Reviewed lifestyle modifications including: creating an exercise routine - walking or exercise classes - Recommend to STOP Basaglar  (insulin  glargine) and Humalog  (insulin  lispro).  - Recommend to continue Rybelsus  (semaglutide ) 3 mg PO once daily - Patient denies personal or family history of multiple endocrine neoplasia type 2, medullary thyroid  cancer; personal history of pancreatitis or gallbladder disease. - Recommend to check glucose continously using FL3+ CGM.  - Repeat A1C due 06/20/24   Hypertension: - Currently slightly uncontrolled above goal < 130/80 per recent clinic BP. Patient is not monitoring BP at home. Asked her again to send a week's worth of readings via MyChart, so that we can adjust medication if needed. There is room to increase her hydralazine  to 50 mg two or three times a day, if needed. Suspect adherence issues are main contributor to lack of control.  - Reviewed long term cardiovascular and renal outcomes of uncontrolled blood pressure - Reviewed appropriate blood pressure monitoring technique and reviewed goal  blood pressure. Recommended to check home blood pressure and heart rate once daily.  - Recommend to continue candesartan  32 mg daily, chlorthalidone  25 mg daily, and hydralazine  25 mg BID.  - Requested fills from [facility] pharmacy today.     Hyperlipidemia/ASCVD Risk Reduction: - Currently uncontrolled with last LDL-C 122 mg/dL above goal < 70 mg/dL given U7IF and ASCVD 10 year risk of 18.9%. Last lipid panel coincided with inconsistent adherence to statin. Re-emphasized importance of adherence today. Plan to repeat lipid panel in 6-12 weeks after consistently taking atorvastatin .  - Reviewed long term complications of uncontrolled cholesterol -  Recommend to continue atorvastatin  40 mg daily. Requested fill from [facility] pharmacy today.    Follow Up Plan: PharmD telephone 07/17/24, PCP 06/20/24  Lorain Baseman, PharmD [facility] [phone]'

## 2024-07-17 NOTE — Telephone Encounter (Signed)
 Attempted to contact patient for scheduled appointment for medication management. Left HIPAA compliant message for patient to return my call at their convenience.   Lorain Baseman, PharmD [facility] [phone]

## 2024-07-24 ENCOUNTER — Other Ambulatory Visit (HOSPITAL_BASED_OUTPATIENT_CLINIC_OR_DEPARTMENT_OTHER): Payer: Self-pay

## 2024-07-27 ENCOUNTER — Ambulatory Visit: Payer: Self-pay | Admitting: Nurse Practitioner

## 2024-07-27 ENCOUNTER — Other Ambulatory Visit: Payer: Self-pay

## 2024-07-27 ENCOUNTER — Encounter: Payer: Self-pay | Admitting: Nurse Practitioner

## 2024-07-27 VITALS — BP 135/75 | HR 76 | Wt 199.6 lb

## 2024-07-27 DIAGNOSIS — E119 Type 2 diabetes mellitus without complications: Secondary | ICD-10-CM | POA: Diagnosis not present

## 2024-07-27 LAB — POCT GLYCOSYLATED HEMOGLOBIN (HGB A1C): Hemoglobin A1C: 5.9 % — AB (ref 4.0–5.6)

## 2024-07-27 NOTE — Progress Notes (Signed)
 Subjective   Patient ID: Kimberly Herman, female    DOB: 1966/03/15, 58 y.o.   MRN: [id number]  Chief Complaint  Patient presents with   Diabetes    Referring provider: Oley Bascom RAMAN, NP  ABBE BULA is a 58 y.o. female with Past Medical History: No date: Diabetes mellitus without complication (HCC) No date: Hypertension 2015: Menopause 10/2019: Vaginal polyp 08/2019: Vitamin D  deficiency   HPI  Patient presents today for follow-up on diabetes and hypertension. Patient states that she is compliant with her medications.  She is currently taking rybelsus  3 mg.  She is followed by pharmacy for medication management.  Last A1C was 5.9 today. Referral placed to pharmacy for medication management.  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  We will place order denies f/c/s, n/v/d, hemoptysis, PND, leg swelling. Denies chest pain or edema.      Allergies  Allergen Reactions   Amlodipine  Swelling   Bactrim  [Sulfamethoxazole -Trimethoprim ] Nausea Only    Extreme nausea and felt really bad   Carvedilol  Diarrhea    Immunization History  Administered Date(s) Administered   PFIZER(Purple Top)SARS-COV-2 Vaccination 05/07/2020, 06/24/2020   PPD Test 07/09/2020    Tobacco History: Social History   Tobacco Use  Smoking Status Never  Smokeless Tobacco Never   Counseling given: Not Answered   Outpatient Encounter Medications as of 07/27/2024  Medication Sig   atorvastatin  (LIPITOR) 40 MG tablet Take 1 tablet (40 mg total) by mouth daily.   blood glucose meter kit and supplies KIT Dispense based on patient and insurance preference. Use up to four times daily as directed. (FOR ICD-9 250.00, 250.01).   Blood Glucose Monitoring Suppl (TRUE METRIX AIR GLUCOSE METER) w/Device KIT Test 2 (two) times daily.   candesartan  (ATACAND ) 32 MG tablet Take 1 tablet (32 mg total) by mouth daily.   chlorthalidone  (HYGROTON ) 25 MG  tablet Take 1 tablet (25 mg total) by mouth daily.   Continuous Glucose Sensor (FREESTYLE LIBRE 3 PLUS SENSOR) MISC Change sensor every 15 days.   glucose blood (TRUE METRIX BLOOD GLUCOSE TEST) test strip Use as instructed   hydrALAZINE  (APRESOLINE ) 25 MG tablet Take 1 tablet (25 mg total) by mouth 2 (two) times daily.   Lancets (ONETOUCH ULTRASOFT) lancets Use two times daily.   Semaglutide  (RYBELSUS ) 3 MG TABS Take 1 tablet (3 mg total) by mouth daily. Take 30 minutes before any other food, drink, or medication.   Vitamin D , Ergocalciferol , 50000 units CAPS Take 1 capsule by mouth once a week.   No facility-administered encounter medications on file as of 07/27/2024.    Review of Systems  Review of Systems  Constitutional: Negative.   HENT: Negative.    Cardiovascular: Negative.   Gastrointestinal: Negative.   Allergic/Immunologic: Negative.   Neurological: Negative.   Psychiatric/Behavioral: Negative.       Objective:   BP 135/75   Pulse 76   Wt 199 lb 9.6 oz (90.5 kg)   SpO2 98%   BMI 31.26 kg/m   Wt Readings from Last 5 Encounters:  07/27/24 199 lb 9.6 oz (90.5 kg)  03/21/24 206 lb (93.4 kg)  12/22/23 210 lb (95.3 kg)  09/21/23 211 lb 12.8 oz (96.1 kg)  07/14/23 207 lb (93.9 kg)     Physical Exam Vitals and nursing note reviewed.  Constitutional:      General: She is not in acute distress.    Appearance: She is well-developed.  Cardiovascular:     Rate and Rhythm: Normal rate and regular rhythm.  Pulmonary:     Effort: Pulmonary effort is normal.     Breath sounds: Normal breath sounds.  Neurological:     Mental Status: She is alert and oriented to person, place, and time.       Assessment & Plan:   Type 2 diabetes mellitus without complication, without long-term current use of insulin  (HCC) -     POCT glycosylated hemoglobin (Hb A1C)     Return in about 3 months (around 10/26/2024).   Bascom GORMAN Borer, NP 07/27/2024

## 2024-08-01 ENCOUNTER — Other Ambulatory Visit: Payer: Self-pay

## 2024-08-03 ENCOUNTER — Other Ambulatory Visit: Payer: Self-pay

## 2024-09-03 ENCOUNTER — Other Ambulatory Visit: Payer: Self-pay

## 2024-09-04 ENCOUNTER — Other Ambulatory Visit: Payer: Self-pay

## 2024-09-04 ENCOUNTER — Telehealth: Payer: Self-pay

## 2024-09-04 NOTE — Telephone Encounter (Signed)
 Attempted to contact patient for scheduled appointment for medication management. Left HIPAA compliant message for patient to return my call at their convenience.   Reviewed patient's [facility] report which continues to show excellent BG control with TIR of 88% above goal > 70%.      Lorain Baseman, PharmD [facility] [phone]

## 2024-09-04 NOTE — Progress Notes (Deleted)
 09/04/2024 Name: Kimberly Herman MRN: [id number] DOB: 15-Jan-1966  No chief complaint on file.   Kimberly Herman is a 58 y.o. year old female who presented for a telephone visit.   They were referred to the pharmacist by their PCP for assistance in managing diabetes. PMH includes HTN, T2DM, HLD, IDA.  Subjective: Patient was engaged in-person by pharmacy on 04/24/24. At this visit, patient reported doing well. Her BG had been higher recently and her fill hx suggested missed doses of her medications. She was initiated on Rybelsus  3 mg daily. Patient was instructed to share in BP over MyChart to guide titration, but we did not receive updated home blood pressures. Between now and her last appt, her CGM has demonstrated excellent glycemic control. She did not attend her endocrinology appt on 05/08/24. At pharmacy telephone appt on 06/12/24, patient reported she was doing well with Rybelsus  and had stopped taking insulin . CGM report demonstrated excellent time in range of 95%. She saw PCP, Bascom Borer, on 07/27/24 and A1C had improved from 8.2% to 5.9%. BP was 135/75 mmHg.  Today, she reports doing well. She states that she was sick to her stomach (nausea) after starting to Rybelsus  for a while (first month), but this has started to improve over the past month. Started to eat smaller portions.   Care Team: Primary Care Provider: Borer Bascom RAMAN, NP ; Next Scheduled Visit: 06/20/24 Endocrinologist Obadiah Birmingham, MD; Next Scheduled Visit: 05/08/24  Medication Access/Adherence  Current Pharmacy:  [facility] [address] [place] [address] Phone: [phone] Fax: [phone]   Patient reports affordability concerns with their medications: Yes  - FL3+ CGM has been expensive for her in the past. She usually paid $37.99 for one sensor to last 14 days, rather than $75 per mo. Feels that it is more doable, now that she is no longer paying  for insulin .  Patient reports access/transportation concerns to their pharmacy: No  Patient reports adherence concerns with their medications:  Yes  - fill hx suggests frequently missed doses. She is currently out of candesartan  and hydralazine  per patient report. Should also be out of atorvastatin  per fill hx.   Diabetes:  Current medications: Basaglar  (insulin  glargine) 24 units daily (not taking), Humalog  (insulin  lispro) 16 units with breakfast and 20 units daily with supper (not taking), Rybelsus  (semaglutide ) 3 mg daily (taking appropriately)  Reports that she hasn't taken any insulin  since starting Rybelsus .  Initially experienced nausea during the first month of taking Rybelsus  but this has recently improved.   Medications tried in the past: glipizide , metformin  - renal fxn. GLP-1RAs were previously too expensive with high-deductible plan  Current glucose readings:  Currently using FL3+ CGM   Date of Download: 09/04/24     Patient denies hypoglycemic s/sx including dizziness, shakiness, sweating. Patient denies hyperglycemic symptoms including polyuria, polydipsia, polyphagia, nocturia, neuropathy, blurred vision.  Current meal patterns: Eats three meals a day: Reports that her appetite has decreased, but she is still able to eat 3 small meals throughout the day. She is conscious of eating high protein diet, has sugar in moderation. Reports that cravings for sugar have decreased - Drinks: water, coffee with splenda, juice (thinks it is zero sugar), rarely soda, sweet tea  Current physical activity: no purposeful exercise right now, has membership to [employer]. She enjoys walking on the treadmil  Current medication access support: none  Hypertension:  Current medications: candesartan  32 mg daily (  needs to pick up from pharmacy), chlorthalidone  25 mg daily , hydralazine  25 mg BID (needs to pick up from pharmacy) Medications previously tried: carvedilol  - diarrhea,  amlodipine  - LEE  Patient has a validated, automated, upper arm home BP cuff Current blood pressure readings readings: not currently checking - reports that she will share readings via MyChart as requested.   Patient denies hypotensive s/sx including dizziness, lightheadedness.  Patient denies hypertensive symptoms including headache, chest pain, shortness of breath   Hyperlipidemia/ASCVD Risk Reduction  Current lipid lowering medications: atorvastatin  40 mg daily (reports that she is taking in the evenings) Medications tried in the past:    Clinical ASCVD: No  The 10-year ASCVD risk score (Arnett DK, et al., 2019) is: 19.2%   Values used to calculate the score:     Age: 56 years     Clincally relevant sex: Female     Is Non-Hispanic African American: Yes     Diabetic: Yes     Tobacco smoker: No     Systolic Blood Pressure: 135 mmHg     Is BP treated: Yes     HDL Cholesterol: 48 mg/dL     Total Cholesterol: 199 mg/dL    Objective:  BP Readings from Last 3 Encounters:  07/27/24 135/75  04/24/24 138/84  03/21/24 134/67   Lab Results  Component Value Date   HGBA1C 5.9 (A) 07/27/2024   HGBA1C 8.2 (A) 03/21/2024   HGBA1C 7.9 (A) 12/22/2023    Lab Results  Component Value Date   CREATININE 1.50 (H) 12/22/2023   BUN 18 12/22/2023   NA 140 12/22/2023   K 3.7 12/22/2023   CL 99 12/22/2023   CO2 26 12/22/2023    Lab Results  Component Value Date   CHOL 199 12/22/2023   HDL 48 12/22/2023   LDLCALC 122 (H) 12/22/2023   TRIG 161 (H) 12/22/2023   CHOLHDL 4.1 12/22/2023    Medications Reviewed Today   Medications were not reviewed in this encounter       Assessment/Plan:   Diabetes: - Currently controlled with TIR of 95% above goal > 70% and GMI of 6.4%. Expect next A1C to be much improved from 8.2%. Patient reports that she has not taken insulin  since starting Rybelsus . Appears she is responding very well to low-dose GLP-1RA. Suspect that improved diet is  also contributing to current glycemic control. She continues to be a good candidate for GLP-1RA with BMI> 30, CKD. Agree with patient's self-discontinuation of basal and prandial insulin . Encouraged continued use of CGM, now that she is no longer paying for insulin .  - Reviewed long term cardiovascular and renal outcomes of uncontrolled blood sugar - Reviewed goal A1c, goal fasting, and goal 2 hour post prandial glucose - Reviewed dietary modifications including maximizing protein, limiting portion of carbohydrate food and drinks, drinking water throughout the day - Reviewed lifestyle modifications including: creating an exercise routine - walking or exercise classes - Recommend to STOP Basaglar  (insulin  glargine) and Humalog  (insulin  lispro).  - Recommend to continue Rybelsus  (semaglutide ) 3 mg PO once daily - Patient denies personal or family history of multiple endocrine neoplasia type 2, medullary thyroid  cancer; personal history of pancreatitis or gallbladder disease. - Recommend to check glucose continously using FL3+ CGM.  - Repeat A1C due 06/20/24   Hypertension: - Currently slightly uncontrolled above goal < 130/80 per recent clinic BP. Patient is not monitoring BP at home. Asked her again to send a week's worth of readings via MyChart, so  that we can adjust medication if needed. There is room to increase her hydralazine  to 50 mg two or three times a day, if needed. Suspect adherence issues are main contributor to lack of control.  - Reviewed long term cardiovascular and renal outcomes of uncontrolled blood pressure - Reviewed appropriate blood pressure monitoring technique and reviewed goal blood pressure. Recommended to check home blood pressure and heart rate once daily.  - Recommend to continue candesartan  32 mg daily, chlorthalidone  25 mg daily, and hydralazine  25 mg BID.  - Requested fills from [facility] pharmacy today.     Hyperlipidemia/ASCVD Risk Reduction: - Currently uncontrolled  with last LDL-C 122 mg/dL above goal < 70 mg/dL given U7IF and ASCVD 10 year risk of 18.9%. Last lipid panel coincided with inconsistent adherence to statin. Re-emphasized importance of adherence today. Plan to repeat lipid panel in 6-12 weeks after consistently taking atorvastatin .  - Reviewed long term complications of uncontrolled cholesterol - Recommend to continue atorvastatin  40 mg daily. Requested fill from [facility] pharmacy today.   Follow Up Plan: PharmD telephone 07/17/24, PCP 06/20/24  Lorain Baseman, PharmD [facility] [phone]'

## 2024-09-05 ENCOUNTER — Other Ambulatory Visit: Payer: Self-pay

## 2024-09-10 ENCOUNTER — Telehealth: Payer: Self-pay | Admitting: *Deleted

## 2024-09-10 NOTE — Progress Notes (Unsigned)
 Complex Care Management Care Guide Note  09/10/2024 Name: Kimberly Herman MRN: [id number] DOB: 03/05/1966  Kimberly Herman is a 58 y.o. year old female who is a primary care patient of Oley Bascom RAMAN, NP and is actively engaged with the care management team. I reached out to Devere CHRISTELLA Fireman by phone today to assist with re-scheduling  with the Pharmacist.  Follow up plan: Unsuccessful telephone outreach attempt made. A HIPAA compliant phone message was left for the patient providing contact information and requesting a return call.  Harlene Satterfield  [facility]  Value-Based Care Institute, [facility] Guide  Direct Dial : [phone]  Fax [phone]

## 2024-09-13 NOTE — Progress Notes (Unsigned)
 Complex Care Management Care Guide Note  09/13/2024 Name: SOLANA COGGIN MRN: [id number] DOB: [birth year]  JESTINE BICKNELL is a 58 y.o. year old female who is a primary care patient of Oley Bascom RAMAN, NP and is actively engaged with the care management team. I reached out to Devere CHRISTELLA Fireman by phone today to assist with re-scheduling  with the Pharmacist.  Follow up plan: Unsuccessful telephone outreach attempt made. A HIPAA compliant phone message was left for the patient providing contact information and requesting a return call. Harlene Satterfield  [facility]  Value-Based Care Institute, [facility] Guide  Direct Dial : [phone]  Fax [phone]

## 2024-09-14 NOTE — Progress Notes (Signed)
 Complex Care Management Care Guide Note  09/14/2024 Name: [name] MRN: [id number] DOB: [birth year]  Kimberly Herman is a 58 y.o. year old female who is a primary care patient of Oley Bascom RAMAN, NP and is actively engaged with the care management team. I reached out to Devere CHRISTELLA Fireman by phone today to assist with re-scheduling  with the Pharmacist.  Follow up plan: Telephone appointment with complex care management team member scheduled for:  10/31/24  Harlene Satterfield  [facility]  Value-Based Care Institute, [facility] Guide  Direct Dial : [phone]  Fax [phone]

## 2024-10-15 ENCOUNTER — Other Ambulatory Visit: Payer: Self-pay

## 2024-10-16 ENCOUNTER — Other Ambulatory Visit (HOSPITAL_BASED_OUTPATIENT_CLINIC_OR_DEPARTMENT_OTHER): Payer: Self-pay

## 2024-10-31 ENCOUNTER — Other Ambulatory Visit (INDEPENDENT_AMBULATORY_CARE_PROVIDER_SITE_OTHER): Payer: Self-pay

## 2024-10-31 ENCOUNTER — Other Ambulatory Visit: Payer: Self-pay

## 2024-10-31 DIAGNOSIS — I10 Essential (primary) hypertension: Secondary | ICD-10-CM

## 2024-10-31 MED ORDER — CANDESARTAN CILEXETIL 32 MG PO TABS
32.0000 mg | ORAL_TABLET | Freq: Every day | ORAL | 3 refills | Status: DC
  Filled 2024-10-31: qty 90, 90d supply, fill #0

## 2024-10-31 NOTE — Progress Notes (Signed)
 10/31/2024 Name: Kimberly Herman MRN: [id number] DOB: [birth year]  Chief Complaint  Patient presents with   Diabetes   Hypertension    Kimberly Herman is a 58 y.o. year old female who presented for a telephone visit.   They were referred to the pharmacist by their PCP for assistance in managing diabetes. PMH includes HTN, T2DM, HLD, IDA.  Subjective: Patient was last seen by PCP, Bascom Borer, NP on 07/27/24. A1C had improved to 5.9%. She was tolerating Rybelsus  3 mg daily well. She was connected to CGM which has continued to demonstrate excellent glycemic control.   Today, she reports doing well. She states she needs a refill of candesartan  and Libre 3+ sensors. Per fill hx, issues with regular adherence to atorvastatin . She feels that BG have been well controlled when she stays away from sugar. Not wanting to increase Rybelsus  dose due to occasional GI AE.  Care Team: Primary Care Provider: Borer Bascom RAMAN, NP ; Next Scheduled Visit: [id number]  Medication Access/Adherence  Current Pharmacy:  [facility] [address] [place] [address] Phone: [phone] Fax: [phone]   Patient reports affordability concerns with their medications: Yes  - FL3+ CGM has been expensive for her in the past. She usually paid $37.99 for one sensor to last 14 days, rather than $75 per mo. Feels that it is more doable, now that she is no longer paying for insulin .   Patient reports access/transportation concerns to their pharmacy: No   Patient reports adherence concerns with their medications:  Yes  - fill hx suggests frequently missed doses. She is currently out of atorvastatin  and hydralazine  per patient report.   Diabetes:  Current medications: Rybelsus  (semaglutide ) 3 mg daily (taking appropriately)  Only experiencing occasional GI AE with Rybelsus  - not wanting to increase dose  Medications tried in the past: glipizide ,  metformin  - renal fxn. GLP-1RAs were previously too expensive with high-deductible plan, basal/bolus insulin  - discontinued with initiation of Rybelsus   Current glucose readings:  Currently using FL3+ CGM   Date of Download: 10/31/24    90 day (07/26/24 to 10/23/24, CGM active 82%): GMI 6.7% and TIR 86%  Patient denies hypoglycemic s/sx including dizziness, shakiness, sweating. Patient denies hyperglycemic symptoms including polyuria, polydipsia, polyphagia, nocturia, neuropathy, blurred vision.  Current meal patterns: Eats three meals a day: Reports that her appetite has decreased, but she is still able to eat 3 small meals throughout the day. She is conscious of eating high protein diet, has sugar in moderation. Reports that cravings for sugar have decreased - Drinks: water, coffee with splenda, juice (thinks it is zero sugar), rarely soda, sweet tea  Current physical activity: She enjoys walking on the treadmil  Current medication access support: commercial insurance  Hypertension:  Current medications: candesartan  32 mg daily (needs to pick up from pharmacy), chlorthalidone  25 mg daily, hydralazine  25 mg BID (unclear adherence) Medications previously tried: carvedilol  - diarrhea, amlodipine  - LEE  Patient has a validated, automated, upper arm home BP cuff Current blood pressure readings readings: not currently checking - has not sent readings via MyChart as requested  Patient denies hypotensive s/sx including dizziness, lightheadedness.  Patient denies hypertensive symptoms including headache, chest pain, shortness of breath   Hyperlipidemia/ASCVD Risk Reduction  Current lipid lowering medications: atorvastatin  40 mg daily (not filled since July 2025)   Clinical ASCVD: No  The 10-year ASCVD risk score (Arnett DK, et al., 2019) is: 19.2%  Values used to calculate the score:     Age: 47 years     Clincally relevant sex: Female     Is Non-Hispanic African American: Yes      Diabetic: Yes     Tobacco smoker: No     Systolic Blood Pressure: 135 mmHg     Is BP treated: Yes     HDL Cholesterol: 48 mg/dL     Total Cholesterol: 199 mg/dL    Objective:  BP Readings from Last 3 Encounters:  07/27/24 135/75  04/24/24 138/84  03/21/24 134/67   Lab Results  Component Value Date   HGBA1C 5.9 (A) 07/27/2024   HGBA1C 8.2 (A) 03/21/2024   HGBA1C 7.9 (A) 12/22/2023    Lab Results  Component Value Date   CREATININE 1.50 (H) 12/22/2023   BUN 18 12/22/2023   NA 140 12/22/2023   K 3.7 12/22/2023   CL 99 12/22/2023   CO2 26 12/22/2023    Lab Results  Component Value Date   CHOL 199 12/22/2023   HDL 48 12/22/2023   LDLCALC 122 (H) 12/22/2023   TRIG 161 (H) 12/22/2023   CHOLHDL 4.1 12/22/2023    Medications Reviewed Today     Reviewed by Brinda Lorain SQUIBB, RPH-CPP (Pharmacist) on 10/31/24 at 1006  Med List Status: <None>   Medication Order Taking? Sig Documenting Provider Last Dose Status Informant  atorvastatin  (LIPITOR) 40 MG tablet [id number]  Take 1 tablet (40 mg total) by mouth daily. Oley Bascom RAMAN, NP  Active   blood glucose meter kit and supplies KIT [id number]  Dispense based on patient and insurance preference. Use up to four times daily as directed. (FOR ICD-9 250.00, 250.01). Stroud, Natalie M, FNP  Active Self  Blood Glucose Monitoring Suppl (TRUE METRIX AIR GLUCOSE METER) w/Device KIT [id number]  Test 2 (two) times daily. Oley Bascom RAMAN, NP  Active   candesartan  (ATACAND ) 32 MG tablet [id number] Yes Take 1 tablet (32 mg total) by mouth daily. Oley Bascom RAMAN, NP  Active   chlorthalidone  (HYGROTON ) 25 MG tablet [id number] Yes Take 1 tablet (25 mg total) by mouth daily. Oley Bascom RAMAN, NP  Active   Continuous Glucose Sensor (FREESTYLE LIBRE 3 PLUS SENSOR) [place] [id number]  Change sensor every 15 days. Oley Bascom RAMAN, NP  Active   glucose blood (TRUE METRIX BLOOD GLUCOSE TEST) test strip [id number]  Use as instructed Oley Bascom RAMAN, NP  Active    hydrALAZINE  (APRESOLINE ) 25 MG tablet [id number]  Take 1 tablet (25 mg total) by mouth 2 (two) times daily. Oley Bascom RAMAN, NP  Active   Lancets [facility] ULTRASOFT) lancets [id number]  Use two times daily. Oley Bascom RAMAN, NP  Active   Semaglutide  (RYBELSUS ) 3 MG TABS [id number] Yes Take 1 tablet (3 mg total) by mouth daily. Take 30 minutes before any other food, drink, or medication. Oley Bascom RAMAN, NP  Active   Vitamin D , Ergocalciferol , 50000 units CAPS [id number] Yes Take 1 capsule by mouth once a week.   Active               Assessment/Plan:   Diabetes: - Currently controlled with 90 day TIR of 86% above goal > 70% and GMI of 6.7%. Last A1C of 5.9% below goal < 7%.  She continues to be a good candidate for GLP-1RA with BMI> 30, CKD. Encouraged continued use of CGM. With CKD, she would be a good candidate for addition of SGLT2i in the future, but hesitant to increase pill burden  with existing adherence issues. - Reviewed long term cardiovascular and renal outcomes of uncontrolled blood sugar - Reviewed goal A1c, goal fasting, and goal 2 hour post prandial glucose - Reviewed dietary modifications including maximizing protein, limiting portion of carbohydrate food and drinks, drinking water throughout the day - Reviewed lifestyle modifications including: creating an exercise routine - walking or exercise classes - Recommend to continue Rybelsus  (semaglutide ) 3 mg PO once daily - Patient denies personal or family history of multiple endocrine neoplasia type 2, medullary thyroid  cancer; personal history of pancreatitis or gallbladder disease. - Recommend to check glucose continously using FL3+ CGM.  - Repeat A1C due now   Hypertension: - Currently slightly uncontrolled above goal < 130/80 per recent clinic BP. Patient is not monitoring BP at home. Anticipate primary barrier to control is adherence based on fill hx. Will collaborate with pharmacy to refill candesartan  today. Continue  to encourage home monitoring. - Reviewed long term cardiovascular and renal outcomes of uncontrolled blood pressure - Reviewed appropriate blood pressure monitoring technique and reviewed goal blood pressure. Recommended to check home blood pressure and heart rate once daily and send readings via MyChart. - Recommend to continue candesartan  32 mg daily, chlorthalidone  25 mg daily, and hydralazine  25 mg BID (unclear if she is taking hydralazine ).  - Requested fills from [facility] pharmacy today.     Hyperlipidemia/ASCVD Risk Reduction: - Currently uncontrolled with last LDL-C 122 mg/dL above goal < 70 mg/dL given U7IF and ASCVD 10 year risk of 18.9%. Last lipid panel coincided with inconsistent adherence to statin. Current fill hx for statin is inadequate.  - Reviewed long term complications of uncontrolled cholesterol - Recommend to continue atorvastatin  40 mg daily. Requested fill from [facility] pharmacy today.   Follow Up Plan: PharmD telephone 12/26/24, PCP 11/01/24  Lorain Baseman, PharmD [facility] [phone]'

## 2024-11-01 ENCOUNTER — Other Ambulatory Visit: Payer: Self-pay

## 2024-11-01 ENCOUNTER — Encounter: Payer: Self-pay | Admitting: Nurse Practitioner

## 2024-11-01 ENCOUNTER — Ambulatory Visit (INDEPENDENT_AMBULATORY_CARE_PROVIDER_SITE_OTHER): Payer: Self-pay | Admitting: Nurse Practitioner

## 2024-11-01 VITALS — BP 146/85 | HR 79 | Wt 202.0 lb

## 2024-11-01 DIAGNOSIS — I10 Essential (primary) hypertension: Secondary | ICD-10-CM | POA: Diagnosis not present

## 2024-11-01 DIAGNOSIS — E119 Type 2 diabetes mellitus without complications: Secondary | ICD-10-CM | POA: Diagnosis not present

## 2024-11-01 LAB — POCT GLYCOSYLATED HEMOGLOBIN (HGB A1C): Hemoglobin A1C: 6.5 % — AB (ref 4.0–5.6)

## 2024-11-01 MED ORDER — CANDESARTAN CILEXETIL 32 MG PO TABS: 32.0000 mg | ORAL_TABLET | Freq: Every day | ORAL | 3 refills | Status: AC

## 2024-11-01 NOTE — Progress Notes (Unsigned)
 Subjective   Patient ID: Kimberly Herman, female    DOB: 07/19/1966, 58 y.o.   MRN: [id number]  Chief Complaint  Patient presents with   Follow-up   Diabetes    Referring provider: Oley Bascom RAMAN, NP  VEVA GRIMLEY is a 58 y.o. female with Past Medical History: No date: Diabetes mellitus without complication (HCC) No date: Hypertension 2015: Menopause 10/2019: Vaginal polyp 08/2019: Vitamin D  deficiency   HPI  Patient presents today for follow-up on diabetes and hypertension. Patient states that she is compliant with her medications.  She is currently taking rybelsus  3 mg.  She is followed by pharmacy for medication management.  Last A1C was 6.5 today. Referral placed to pharmacy for medication management.  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  We will place order denies f/c/s, n/v/d, hemoptysis, PND, leg swelling. Denies chest pain or edema.     Allergies[1]  Immunization History  Administered Date(s) Administered   PFIZER(Purple Top)SARS-COV-2 Vaccination 05/07/2020, 06/24/2020   PPD Test 07/09/2020    Tobacco History: Tobacco Use History[2] Counseling given: Not Answered   Outpatient Encounter Medications as of 11/01/2024  Medication Sig   atorvastatin  (LIPITOR) 40 MG tablet Take 1 tablet (40 mg total) by mouth daily.   blood glucose meter kit and supplies KIT Dispense based on patient and insurance preference. Use up to four times daily as directed. (FOR ICD-9 250.00, 250.01).   Blood Glucose Monitoring Suppl (TRUE METRIX AIR GLUCOSE METER) w/Device KIT Test 2 (two) times daily.   chlorthalidone  (HYGROTON ) 25 MG tablet Take 1 tablet (25 mg total) by mouth daily.   Continuous Glucose Sensor (FREESTYLE LIBRE 3 PLUS SENSOR) MISC Change sensor every 15 days.   glucose blood (TRUE METRIX BLOOD GLUCOSE TEST) test strip Use as instructed   hydrALAZINE  (APRESOLINE ) 25 MG tablet Take 1 tablet (25 mg total)  by mouth 2 (two) times daily.   Lancets (ONETOUCH ULTRASOFT) lancets Use two times daily.   Semaglutide  (RYBELSUS ) 3 MG TABS Take 1 tablet (3 mg total) by mouth daily. Take 30 minutes before any other food, drink, or medication.   Vitamin D , Ergocalciferol , 50000 units CAPS Take 1 capsule by mouth once a week.   [DISCONTINUED] candesartan  (ATACAND ) 32 MG tablet Take 1 tablet (32 mg total) by mouth daily.   candesartan  (ATACAND ) 32 MG tablet Take 1 tablet (32 mg total) by mouth daily.   No facility-administered encounter medications on file as of 11/01/2024.    Review of Systems  Review of Systems  Constitutional: Negative.   HENT: Negative.    Cardiovascular: Negative.   Gastrointestinal: Negative.   Allergic/Immunologic: Negative.   Neurological: Negative.   Psychiatric/Behavioral: Negative.       Objective:   BP (!) 146/85 (BP Location: Left Arm, Patient Position: Sitting, Cuff Size: Large)   Pulse 79   Wt 202 lb (91.6 kg)   SpO2 98%   BMI 31.64 kg/m   Wt Readings from Last 5 Encounters:  11/01/24 202 lb (91.6 kg)  07/27/24 199 lb 9.6 oz (90.5 kg)  03/21/24 206 lb (93.4 kg)  12/22/23 210 lb (95.3 kg)  09/21/23 211 lb 12.8 oz (96.1 kg)     Physical Exam Vitals and nursing note reviewed.  Constitutional:      General: She is not in acute distress.    Appearance: She is well-developed.  Cardiovascular:     Rate and Rhythm: Normal rate and regular  rhythm.  Pulmonary:     Effort: Pulmonary effort is normal.     Breath sounds: Normal breath sounds.  Neurological:     Mental Status: She is alert and oriented to person, place, and time.     {Labs (Optional):23779}  Assessment & Plan:   Type 2 diabetes mellitus without complication, without long-term current use of insulin  (HCC) -     POCT glycosylated hemoglobin (Hb A1C)  Essential hypertension -     Candesartan  Cilexetil; Take 1 tablet (32 mg total) by mouth daily.  Dispense: 90 tablet; Refill: 3      Return in about 3 months (around 01/30/2025).     Bascom GORMAN Borer, NP 11/01/2024     [1]  Allergies Allergen Reactions   Amlodipine  Swelling   Bactrim  [Sulfamethoxazole -Trimethoprim ] Nausea Only    Extreme nausea and felt really bad   Carvedilol  Diarrhea  [2]  Social History Tobacco Use  Smoking Status Never  Smokeless Tobacco Never

## 2024-11-20 ENCOUNTER — Other Ambulatory Visit: Payer: Self-pay

## 2024-11-20 ENCOUNTER — Telehealth: Payer: Self-pay

## 2024-11-20 DIAGNOSIS — E119 Type 2 diabetes mellitus without complications: Secondary | ICD-10-CM

## 2024-11-20 MED ORDER — LANTUS SOLOSTAR 100 UNIT/ML ~~LOC~~ SOPN
12.0000 [IU] | PEN_INJECTOR | Freq: Every day | SUBCUTANEOUS | 3 refills | Status: AC
  Filled 2024-11-20: qty 6, 50d supply, fill #0

## 2024-11-20 MED ORDER — RYBELSUS 7 MG PO TABS
7.0000 mg | ORAL_TABLET | Freq: Every day | ORAL | 5 refills | Status: AC
  Filled 2024-11-20 – 2024-12-28 (×5): qty 30, 30d supply, fill #0

## 2024-11-20 NOTE — Progress Notes (Signed)
 Patient left me a voicemail on 11/19/24 describing sudden change in glycemic control per CGM. Last A1C was 6.5%, which correlated with 90 day (07/26/24 to 10/23/24) TIR of 86%, while patient was taking Rybelsus  3 mg alone. Most recent 2-week AGP demonstrates TIR of 17% and GMI of 8.8% (avg BG 230 mg/dL).   Upon calling patient, she reports her diet has not changed. Not consuming large amounts of sugar. However, she does report that she is noticing spikes after sugary foods (like half a donut).  She reports she has been giving herself long acting insulin . Yesterday and the day before, took 30 units of Lantus . Says she has 2 pens of long acting insulin  left at home.  Only change is that she is having pain in her back. Thinks it could be sciatic nerve pain.      Assessment/Plan:   Diabetes: - Rapid change in control with 2 week TIR of 17%, compared to 86% several weeks ago. Last A1C 6.5% below goal < 7%. Patient reports that all the sudden she started having more BG spikes. Some spikes have been related to small portions of sugary foods, but this was not happening previously. Pain could be contributing to elevated sugars. Will increase Rybelsus  and continue low dose of basal insulin .  - Reviewed long term cardiovascular and renal outcomes of uncontrolled blood sugar - Reviewed goal A1c, goal fasting, and goal 2 hour post prandial glucose - Reviewed dietary modifications including maximizing protein, limiting portion of carbohydrate food and drinks, drinking water throughout the day - Reviewed lifestyle modifications including: creating an exercise routine - walking or exercise classes - Recommend to increase Rybelsus  to 7 mg daily. - Recommend to restart insulin  glargine (Basaglar ) at 12 units daily.  - Patient denies personal or family history of multiple endocrine neoplasia type 2, medullary thyroid  cancer; personal history of pancreatitis or gallbladder disease. - Recommend to check glucose  continously using FL3+ CGM.  - Repeat A1C due March 2026  Lorain Baseman, PharmD [facility] [phone]

## 2024-11-21 ENCOUNTER — Other Ambulatory Visit: Payer: Self-pay

## 2024-11-29 ENCOUNTER — Other Ambulatory Visit: Payer: Self-pay

## 2024-12-07 ENCOUNTER — Other Ambulatory Visit: Payer: Self-pay

## 2024-12-10 ENCOUNTER — Other Ambulatory Visit: Payer: Self-pay

## 2024-12-12 ENCOUNTER — Other Ambulatory Visit: Payer: Self-pay

## 2024-12-13 ENCOUNTER — Other Ambulatory Visit: Payer: Self-pay

## 2024-12-26 ENCOUNTER — Other Ambulatory Visit (INDEPENDENT_AMBULATORY_CARE_PROVIDER_SITE_OTHER): Payer: Self-pay

## 2024-12-26 ENCOUNTER — Other Ambulatory Visit (HOSPITAL_COMMUNITY): Payer: Self-pay

## 2024-12-26 ENCOUNTER — Other Ambulatory Visit: Payer: Self-pay

## 2024-12-26 DIAGNOSIS — I10 Essential (primary) hypertension: Secondary | ICD-10-CM

## 2024-12-26 DIAGNOSIS — E119 Type 2 diabetes mellitus without complications: Secondary | ICD-10-CM

## 2024-12-26 MED ORDER — CHLORTHALIDONE 25 MG PO TABS
25.0000 mg | ORAL_TABLET | Freq: Every day | ORAL | 3 refills | Status: AC
  Filled 2024-12-26 (×3): qty 90, 90d supply, fill #0

## 2024-12-26 MED ORDER — ATORVASTATIN CALCIUM 40 MG PO TABS
40.0000 mg | ORAL_TABLET | Freq: Every day | ORAL | 3 refills | Status: AC
  Filled 2024-12-26 (×3): qty 90, 90d supply, fill #0

## 2024-12-26 MED ORDER — HYDRALAZINE HCL 25 MG PO TABS
25.0000 mg | ORAL_TABLET | Freq: Two times a day (BID) | ORAL | 3 refills | Status: AC
  Filled 2024-12-26 (×2): qty 180, 90d supply, fill #0

## 2024-12-26 NOTE — Progress Notes (Signed)
 "  12/26/2024 Name: Kimberly Herman MRN: [id number] DOB: Jun 14, 1966  Chief Complaint  Patient presents with   Diabetes    Kimberly Herman is a 59 y.o. year old female who presented for a telephone visit.   They were referred to the pharmacist by their PCP for assistance in managing diabetes. PMH includes HTN, T2DM, HLD, IDA, CKD (hx of L nephrectomy)  Subjective: Patient was last seen by PCP, Bascom Borer, NP on 11/01/24. A1C was 6.5%. BP was 146/85 mmHg. Patient contacted me on 11/20/24 with concerns of acute worsening of BG control. She had self resumed insulin . She was advised to increase Rybelsus  to 7 mg daily.   Today, she reports doing well. Reports BG normalized with increase in Rybelsus  to 7 mg daily and she is no longer needing long acting insulin . She is running low on her medications. Has been unable to get to the pharmacy due to weather. She has one pill left of Rybelsus . Requests that these are processed for mail order. She is also out of Schlusser sensors.    Care Team: Primary Care Provider: Borer Bascom RAMAN, NP ; Next Scheduled Visit: 01/30/25  Medication Access/Adherence  Current Pharmacy:  [facility] [address], [address] [place] [address] Phone: [phone] Fax: [phone]   Patient reports affordability concerns with their medications: Yes  - FL3+ CGM has been expensive for her in the past. She usually paid $37.99 for one sensor to last 14 days, rather than $75 per mo. Feels that it is more doable, now that she is no longer paying for insulin .   Patient reports access/transportation concerns to their pharmacy: No   Patient reports adherence concerns with their medications:  Yes  - fill hx suggests frequently missed doses. She is currently out of atorvastatin  and hydralazine  per patient report.   Diabetes:  Current medications: Rybelsus  (semaglutide ) 7 mg daily  Initially had some GI AE with Rybelsus  7 mg  daily, but resolved now.  Medications tried in the past: glipizide , metformin  - renal fxn. GLP-1RAs were previously too expensive with high-deductible plan, basal/bolus insulin  - discontinued with initiation of Rybelsus   Current glucose readings:  Currently using FL3+ CGM, but needs to pick up refill  Date of Download: 12/26/24 (last available data 12/07/24)      Patient denies hypoglycemic s/sx including dizziness, shakiness, sweating. Patient denies hyperglycemic symptoms including polyuria, polydipsia, polyphagia, nocturia, neuropathy, blurred vision.  Current meal patterns: Eats three meals a day: Reports that her appetite has decreased, but she is still able to eat 3 small meals throughout the day. She is conscious of eating high protein diet, has sugar in moderation. Reports that cravings for sugar have decreased - Drinks: water, coffee with splenda, juice (thinks it is zero sugar), rarely soda, sweet tea  Current physical activity: She enjoys walking on the treadmil  Current medication access support: commercial insurance  Hypertension:  Current medications: candesartan  32 mg daily, chlorthalidone  25 mg daily, hydralazine  25 mg BID - fill hx indicates inconsistent adherence to BP medications. Patient states she is not completely out, but does request refills. Medications previously tried: carvedilol  - diarrhea, amlodipine  - LEE  Patient has a validated, automated, upper arm home BP cuff Current blood pressure readings readings: not currently checking  Patient denies hypotensive s/sx including dizziness, lightheadedness.  Patient denies hypertensive symptoms including headache, chest pain, shortness of breath   Hyperlipidemia/ASCVD Risk Reduction  Current lipid lowering medications: atorvastatin  40 mg  daily (filled 11/01/24 for 30ds)   Clinical ASCVD: No  The 10-year ASCVD risk score (Arnett DK, et al., 2019) is: 23.6%   Values used to calculate the score:     Age: 38  years     Clinically relevant sex: Female     Is Non-Hispanic African American: Yes     Diabetic: Yes     Tobacco smoker: No     Systolic Blood Pressure: 146 mmHg     Is BP treated: Yes     HDL Cholesterol: 48 mg/dL     Total Cholesterol: 199 mg/dL    Objective:  BP Readings from Last 3 Encounters:  11/01/24 (!) 146/85  07/27/24 135/75  04/24/24 138/84   Lab Results  Component Value Date   HGBA1C 6.5 (A) 11/01/2024   HGBA1C 5.9 (A) 07/27/2024   HGBA1C 8.2 (A) 03/21/2024    Lab Results  Component Value Date   CREATININE 1.50 (H) 12/22/2023   BUN 18 12/22/2023   NA 140 12/22/2023   K 3.7 12/22/2023   CL 99 12/22/2023   CO2 26 12/22/2023    Lab Results  Component Value Date   CHOL 199 12/22/2023   HDL 48 12/22/2023   LDLCALC 122 (H) 12/22/2023   TRIG 161 (H) 12/22/2023   CHOLHDL 4.1 12/22/2023    Medications Reviewed Today     Reviewed by Brinda Lorain SQUIBB, RPH-CPP (Pharmacist) on 12/26/24 at 1352  Med List Status: <None>   Medication Order Taking? Sig Documenting Provider Last Dose Status Informant  atorvastatin  (LIPITOR) 40 MG tablet [id number]  Take 1 tablet (40 mg total) by mouth daily. Oley Bascom RAMAN, NP  Active   blood glucose meter kit and supplies KIT [id number]  Dispense based on patient and insurance preference. Use up to four times daily as directed. (FOR ICD-9 250.00, 250.01). Stroud, Natalie M, FNP  Active Self  Blood Glucose Monitoring Suppl (TRUE METRIX AIR GLUCOSE METER) w/Device KIT [id number]  Test 2 (two) times daily. Oley Bascom RAMAN, NP  Active   candesartan  (ATACAND ) 32 MG tablet [id number]  Take 1 tablet (32 mg total) by mouth daily. Oley Bascom RAMAN, NP  Active   chlorthalidone  (HYGROTON ) 25 MG tablet [id number]  Take 1 tablet (25 mg total) by mouth daily. Oley Bascom RAMAN, NP  Active   Continuous Glucose Sensor (FREESTYLE LIBRE 3 PLUS SENSOR) [place] [id number]  Change sensor every 15 days.  Patient not taking: Reported on 12/26/2024   Oley Bascom RAMAN,  NP  Active   glucose blood (TRUE METRIX BLOOD GLUCOSE TEST) test strip [id number]  Use as instructed Oley Bascom RAMAN, NP  Active   hydrALAZINE  (APRESOLINE ) 25 MG tablet [id number]  Take 1 tablet (25 mg total) by mouth 2 (two) times daily. Oley Bascom RAMAN, NP  Active   insulin  glargine (LANTUS  SOLOSTAR) 100 UNIT/ML Solostar Pen [id number]  Inject 12 Units into the skin daily. May increase up to 20 units daily if instructed by your provider.  Patient not taking: Reported on 12/26/2024   Oley Bascom RAMAN, NP  Active   Lancets [facility] ULTRASOFT) lancets [id number]  Use two times daily. Oley Bascom RAMAN, NP  Active   Semaglutide  (RYBELSUS ) 7 MG TABS [id number] Yes Take 1 tablet (7 mg total) by mouth daily. Take in the morning at least 30 minutes before other food, medication, or drinks. Swallow with no more than 1/2 cup of water. Oley Bascom RAMAN, NP  Active   Vitamin D , Ergocalciferol , 50000 units CAPS [id number]  Take 1 capsule by mouth once a week.   Active               Assessment/Plan:   Diabetes: - Currently controlled with 2-week TIR of 94% above goal > 70% and GMI of 6.3% since increasing Rybelsus  to 7 mg daily. Patient is not needing insulin  at this time, but I am leaving the Rx on her profile due to recent fluctuation in control. With CKD, she would be a good candidate for addition of SGLT2i in the future, but hesitant to increase pill burden with existing adherence issues. - Reviewed long term cardiovascular and renal outcomes of uncontrolled blood sugar - Reviewed goal A1c, goal fasting, and goal 2 hour post prandial glucose - Reviewed dietary modifications including maximizing protein, limiting portion of carbohydrate food and drinks, drinking water throughout the day - Reviewed lifestyle modifications including: creating an exercise routine - walking or exercise classes - Recommend to continue Rybelsus  (semaglutide ) 7 mg PO once daily. Collaborated with patient advocate team to  resubmit PA and request delivery from [facility] pharmacy. - Recommend to check glucose continously using FL3+ CGM - refills requested from [facility] pharmacy. Utilized coupon card to bring price to $75/mo. - Repeat A1C due 01/30/25   Hypertension: - Currently  uncontrolled above goal < 130/80 per recent clinic BP. Primary barrier to control is adherence based on fill hx. Will collaborate with pharmacy to refill/deliver medications. - Reviewed long term cardiovascular and renal outcomes of uncontrolled blood pressure - Recommend to continue candesartan  32 mg daily, chlorthalidone  25 mg daily, and hydralazine  25 mg BID     Hyperlipidemia/ASCVD Risk Reduction: - Currently uncontrolled with last LDL-C 122 mg/dL above goal < 70 mg/dL given U7IF and ASCVD 10 year risk of 18.9%. Last lipid panel coincided with inconsistent adherence to statin. Current fill hx for statin is inadequate. - Reviewed long term complications of uncontrolled cholesterol - Recommend to continue atorvastatin  40 mg daily. Requested refill today.   Follow Up Plan: PCP 01/30/25, Pharmacist telephone 02/20/25    Lorain Baseman, PharmD [facility] [phone]'  "

## 2024-12-27 ENCOUNTER — Other Ambulatory Visit: Payer: Self-pay

## 2024-12-27 ENCOUNTER — Other Ambulatory Visit (HOSPITAL_COMMUNITY): Payer: Self-pay

## 2024-12-28 ENCOUNTER — Telehealth (HOSPITAL_COMMUNITY): Payer: Self-pay

## 2024-12-28 ENCOUNTER — Other Ambulatory Visit: Payer: Self-pay

## 2024-12-28 ENCOUNTER — Telehealth: Payer: Self-pay | Admitting: Nurse Practitioner

## 2024-12-28 ENCOUNTER — Other Ambulatory Visit (HOSPITAL_BASED_OUTPATIENT_CLINIC_OR_DEPARTMENT_OTHER): Payer: Self-pay

## 2024-12-28 NOTE — Telephone Encounter (Signed)
 Copied from CRM [id number]. Topic: General - Other >> Dec 28, 2024 12:24 PM Sophia H wrote: Reason for CRM: Patient is requesting to speak with pharmacist Lorain - please reach out # [phone] States order for medication was put in at [facility] long for delivery, lot of confusion behind pricing etc. - [facility].

## 2025-01-30 ENCOUNTER — Ambulatory Visit: Payer: Self-pay | Admitting: Nurse Practitioner

## 2025-02-20 ENCOUNTER — Other Ambulatory Visit: Payer: Self-pay
